# Patient Record
Sex: Male | Born: 1945 | Race: White | Hispanic: No | Marital: Married | Smoking: Former smoker
Health system: Southern US, Community
[De-identification: ages and names within clinical notes are randomized; demographics above are authoritative.]

## PROBLEM LIST (undated history)

## (undated) DIAGNOSIS — Z9989 Dependence on other enabling machines and devices: Secondary | ICD-10-CM

## (undated) DIAGNOSIS — J069 Acute upper respiratory infection, unspecified: Secondary | ICD-10-CM

## (undated) DIAGNOSIS — M1990 Unspecified osteoarthritis, unspecified site: Secondary | ICD-10-CM

## (undated) DIAGNOSIS — E7800 Pure hypercholesterolemia, unspecified: Secondary | ICD-10-CM

## (undated) DIAGNOSIS — I428 Other cardiomyopathies: Secondary | ICD-10-CM

## (undated) DIAGNOSIS — R0602 Shortness of breath: Secondary | ICD-10-CM

## (undated) DIAGNOSIS — I639 Cerebral infarction, unspecified: Secondary | ICD-10-CM

## (undated) DIAGNOSIS — I10 Essential (primary) hypertension: Secondary | ICD-10-CM

## (undated) DIAGNOSIS — G4733 Obstructive sleep apnea (adult) (pediatric): Secondary | ICD-10-CM

## (undated) DIAGNOSIS — E6609 Other obesity due to excess calories: Secondary | ICD-10-CM

## (undated) HISTORY — PX: EYE SURGERY: SHX253

## (undated) HISTORY — DX: Other obesity due to excess calories: E66.09

## (undated) HISTORY — PX: MANDIBLE FRACTURE SURGERY: SHX706

## (undated) HISTORY — DX: Other cardiomyopathies: I42.8

## (undated) HISTORY — DX: Unspecified osteoarthritis, unspecified site: M19.90

## (undated) HISTORY — DX: Obstructive sleep apnea (adult) (pediatric): G47.33

## (undated) HISTORY — DX: Dependence on other enabling machines and devices: Z99.89

---

## 2001-03-29 ENCOUNTER — Ambulatory Visit (HOSPITAL_COMMUNITY): Admission: RE | Admit: 2001-03-29 | Discharge: 2001-03-29 | Payer: Self-pay | Admitting: Internal Medicine

## 2001-03-29 ENCOUNTER — Encounter: Payer: Self-pay | Admitting: Internal Medicine

## 2001-03-30 ENCOUNTER — Ambulatory Visit (HOSPITAL_COMMUNITY): Admission: RE | Admit: 2001-03-30 | Discharge: 2001-03-30 | Payer: Self-pay | Admitting: Internal Medicine

## 2001-03-30 ENCOUNTER — Encounter: Payer: Self-pay | Admitting: Internal Medicine

## 2003-07-18 ENCOUNTER — Encounter: Payer: Self-pay | Admitting: Family Medicine

## 2003-07-18 ENCOUNTER — Encounter: Payer: Self-pay | Admitting: Internal Medicine

## 2003-07-18 ENCOUNTER — Ambulatory Visit (HOSPITAL_COMMUNITY): Admission: RE | Admit: 2003-07-18 | Discharge: 2003-07-18 | Payer: Self-pay | Admitting: Family Medicine

## 2003-07-18 ENCOUNTER — Ambulatory Visit (HOSPITAL_COMMUNITY): Admission: RE | Admit: 2003-07-18 | Discharge: 2003-07-18 | Payer: Self-pay | Admitting: Internal Medicine

## 2003-07-20 ENCOUNTER — Ambulatory Visit (HOSPITAL_COMMUNITY): Admission: RE | Admit: 2003-07-20 | Discharge: 2003-07-20 | Payer: Self-pay | Admitting: Family Medicine

## 2003-07-20 ENCOUNTER — Encounter: Payer: Self-pay | Admitting: Family Medicine

## 2003-08-16 ENCOUNTER — Encounter: Payer: Self-pay | Admitting: Internal Medicine

## 2003-08-16 ENCOUNTER — Ambulatory Visit (HOSPITAL_COMMUNITY): Admission: RE | Admit: 2003-08-16 | Discharge: 2003-08-16 | Payer: Self-pay | Admitting: Internal Medicine

## 2003-10-25 ENCOUNTER — Ambulatory Visit (HOSPITAL_COMMUNITY): Admission: RE | Admit: 2003-10-25 | Discharge: 2003-10-25 | Payer: Self-pay | Admitting: Neurology

## 2003-10-25 ENCOUNTER — Encounter: Payer: Self-pay | Admitting: Cardiology

## 2004-01-08 ENCOUNTER — Ambulatory Visit (HOSPITAL_COMMUNITY): Admission: RE | Admit: 2004-01-08 | Discharge: 2004-01-08 | Payer: Self-pay | Admitting: Internal Medicine

## 2005-06-23 ENCOUNTER — Emergency Department (HOSPITAL_COMMUNITY): Admission: EM | Admit: 2005-06-23 | Discharge: 2005-06-23 | Payer: Self-pay | Admitting: Emergency Medicine

## 2005-06-24 ENCOUNTER — Ambulatory Visit: Payer: Self-pay | Admitting: Orthopedic Surgery

## 2005-07-20 ENCOUNTER — Ambulatory Visit: Payer: Self-pay | Admitting: Orthopedic Surgery

## 2008-05-01 ENCOUNTER — Ambulatory Visit: Payer: Self-pay | Admitting: Internal Medicine

## 2008-05-10 ENCOUNTER — Encounter: Payer: Self-pay | Admitting: Internal Medicine

## 2008-05-10 ENCOUNTER — Ambulatory Visit: Payer: Self-pay | Admitting: Internal Medicine

## 2008-05-10 ENCOUNTER — Ambulatory Visit (HOSPITAL_COMMUNITY): Admission: RE | Admit: 2008-05-10 | Discharge: 2008-05-10 | Payer: Self-pay | Admitting: Internal Medicine

## 2011-04-28 NOTE — Consult Note (Signed)
NAME:  CORDERRO, KOLOSKI             ACCOUNT NO.:  [id number]   MEDICAL RECORD NO.:  [id number]          PATIENT TYPE:  AMB   LOCATION:  DAY                           FACILITY:  [facility]   PHYSICIAN:  R. Roetta Sessions, M.D. DATE OF BIRTH:  [birth year]   DATE OF CONSULTATION:  05/01/2008  DATE OF DISCHARGE:                                 CONSULTATION   REASON FOR CONSULTATION:  Hematochezia.   HISTORY OF PRESENT ILLNESS:  [name] is a 65 year old Caucasian  male.  He has a self-reported history of hemorrhoids, has always had  frequent intermittent bright red blood per rectum.  However, more  recently he has noticed a change in his stool.  He has noticed within  his stool a darker burgundy brown blood.  This has been present for  about the last 2 months, pretty much every time he has a bowel movement.  He noticed the pain is worse with straining, does occasionally has some  bright red blood as well.  He does take aspirin, but denies any other  NSAID use.  Denies any abdominal pain, proctalgia, or rectal pruritus.  Denies any anorexia.  He has noted some fatigue and shortness of breath  on exertion.  He was noted to have a hemoglobin in 14 range at Dr.  Sharyon Medicus office.  He denies any heartburn, ingestion, dysphagia,  odynophagia, nausea, or vomiting.  He does use Metamucil and FiberChoice  daily for constipation, which does seem to help.  He usually has a soft  bowel movement every day.   PAST MEDICAL AND SURGICAL HISTORY:  1. Hypertension.  2. Sleep apnea.  3. CVA.  4. Hemorrhoid surgery when he was in his 30s.  5. Last colonoscopy was January 08, 2004, by Dr. Jena Gauss. He was found      to have a small polyp in the hepatic flexure, which was      inflammatory and he had an EGD, which showed antral and bulbar      erosions and was otherwise normal.   CURRENT MEDICATIONS:  1. Multivitamin daily.  2. Aspirin 81 mg daily.  3. CPAP at night.  4. Fish oil once daily.  5. Metamucil at  bedtime.  6. FiberChoice in the morning.  7. Unknown name of blood pressure medicine 40 mg daily.   ALLERGIES:  No known drug allergies.   FAMILY HISTORY:  Father diagnosed with colon cancer at age 8.  Brother  diagnosed in his 30s.  Mother deceased at 52 due to coronary artery  disease.  One sister deceased secondary to CVA.  One brother deceased  with renal failure.   SOCIAL HISTORY:  Mr. Spilde is married.  He has 2 healthy children.  He is a retired Teacher, adult education.  He has a 30-pack-year history of tobacco use,  quit smoking about 15 years ago.  He denies any drug use.  Consumes 1-2  beers daily.   REVIEW OF SYSTEMS:  See HPI, otherwise negative.  SKIN:  He does notice  a rash around his umbilicus where he previously removed a tic.   PHYSICAL EXAMINATION:  VITAL SIGNS:  Weight 308 pounds, height 71  inches, temperature 98.2, blood pressure 132/80, and pulse 64.  GENERAL:  Mr. Traynham is an obese Caucasian male who is alert,  oriented, pleasant, and cooperative, in no acute distress.  HEENT:  Clear.  Sclerae nonicteric.  Conjunctivae are pink.  Oropharynx  pink and moist without any lesions.  NECK:  Supple without mass or thyromegaly.  CHEST:  Heart:  Regular rate and rhythm.  Normal S1 and S2.  No murmurs,  clicks, rubs, or gallops.  Lungs:  Clear to auscultation bilaterally.  ABDOMEN:  Positive bowel sounds x4.  No bruits auscultated.  He does  have an erythematous lesion at his umbilicus, which is about 1 cm in  circumference with erythema and a scaled center.  There are no exudates.  Abdomen is soft, nontender, and nondistended without palpable mass or  hepatosplenomegaly.  No rebound, tenderness, or guarding.  EXTREMITY:  Exam is limited given the patient's body habitus.  Extremities without clubbing or edema bilaterally.  SKIN:  See abdominal exam.   IMPRESSION:  Mr. Siegert is a 65 year old male with daily hematochezia  with anywhere from bright red blood to burgundy to  dark blood on a daily  basis.  Hemoglobin has remained stable at 14.  Given his strong family  history of colon cancer, we should proceed with colonoscopy to rule out  colorectal carcinoma.  Bleeding could be from benign anorectal source  such as fissure or internal hemorrhoids as well.   PLAN:  1. We will request recent labs from Dr. Sharyon Medicus office.  2. Colonoscopy with Dr. Jena Gauss in the near future.  Discussed the      procedures including the risks and benefits including but not      limited to bleeding, infection, perforation, drug reaction.  He      agrees with plan.  Consent will be obtained.  3. I have asked him to contact Dr. Sharyon Medicus office regarding his recent      tic bite and rash, which needs to be evaluated and he agrees that      he will do this today.   Thank you Dr. Sherwood Gambler for asking [place] to participate in the care of Mr.  Glasscock.      Lorenza Burton, N.P.      Jonathon Bellows, M.D.  Electronically Signed   KJ/MEDQ  D:  05/01/2008  T:  05/02/2008  Job:  [id number]

## 2011-04-28 NOTE — Op Note (Signed)
NAME:  Timothy Mcdaniel, Timothy Mcdaniel             ACCOUNT NO.:  [id number]   MEDICAL RECORD NO.:  [id number]          PATIENT TYPE:  AMB   LOCATION:  DAY                           FACILITY:  [facility]   PHYSICIAN:  R. Roetta Sessions, M.D. DATE OF BIRTH:  1946-04-20   DATE OF PROCEDURE:  05/10/2008  DATE OF DISCHARGE:                               OPERATIVE REPORT   INDICATIONS FOR PROCEDURE:  A 65 year old gentleman with intermittent  blood per rectum on occasion.  Last colonoscopy was a little over 4  years ago __________  inflammatory process with hepatic flexure.  He has  a positive family history of colon cancer.  Colonoscopy is now being  done.  This approach has been discussed with the patient at length.  Potential risks, benefits and alternatives have been reviewed.  Questions answered.  He is agreeable.  Please see documentation in  medical record.   PROCEDURE NOTE:  O2 saturation, blood pressure, pulse, and temperature  were monitored throughout the entire procedure.   CONSCIOUS SEDATION:  Versed 1 mg IV and Demerol 100 mg IV in divided  doses.   INSTRUMENT:  Pentax video chip system.   FINDINGS:  Digital rectal exam revealed no abnormalities.  Endoscopic  findings:  The prep was adequate.  Colon:  Colonic mucosa was surveyed  from the rectosigmoid junction through the left transverse right colon  to the appendiceal orifice, ileocecal valve and the cecum.  The  structures were well seen and photographed for the record.  From this  level, the scope was slowly and cautiously withdrawn.  All previous  mucosal surfaces were again seen.  The patient had a redundant cavernous  colon; however, the colonic mucosa otherwise appeared entirely normal.  The scope was pulled down into the rectum.  Thorough examination of the  rectal mucosa including  anoscopy of the anal canal demonstrated anal  canal hemorrhoids and anal papilla and there was one area of somewhat  unusual-appearing fibrotic mucosa of  the very distal rectum, just at the  anal verge.  It appears to be benign, but I did elect to go ahead and  biopsy this area.  The patient tolerated the procedure well and was  reactive to endoscopy.   IMPRESSION:  1. Friable anal canal hemorrhoids.  2. Anal papilla.  3. Questionable abnormal fold of the distal rectum status post biopsy,      otherwise normal rectum.  4. Redundant cavernous, but otherwise normal-appearing colonic mucosa.      I suspect the patient  has bled from this anorectum.   RECOMMENDATIONS:  1. Hemorrhoid literature provided to Mr. Fuston.  2. A 10-day course of Canasa.  3. Mesalamine 1 g suppositories one per rectum at bedtime.  4. Further recommendations to follow pending results of path.      Jonathon Bellows, M.D.  Electronically Signed     RMR/MEDQ  D:  05/10/2008  T:  05/11/2008  Job:  [id number]   cc:   Madelin Rear. Sherwood Gambler, MD  Fax: [phone]

## 2011-05-01 NOTE — Op Note (Signed)
NAME:  Timothy Mcdaniel, Timothy Mcdaniel                       ACCOUNT NO.:  [id number]   MEDICAL RECORD NO.:  [id number]                   PATIENT TYPE:  AMB   LOCATION:  DAY                                  FACILITY:  [facility]   PHYSICIAN:  R. Roetta Sessions, M.D.              DATE OF BIRTH:  02-17-1946   DATE OF PROCEDURE:  01/08/2004  DATE OF DISCHARGE:                                 OPERATIVE REPORT   PROCEDURE:  Colonoscopy with biopsy followed by diagnostic EGD.   INDICATIONS FOR PROCEDURE:  The patient is a 65 year old gentleman with a  recent history of large volume ring colored stool with some gross blood per  rectum resolved with discontinuation of Plavix.  He has a positive family  history of colorectal cancer.  Colonoscopy back in 1999 demonstrated only  some internal hemorrhoids.   Colonoscopy is now being done to further evaluate his GI bleeding. If the  colonoscopy is unrevealing will perform and EGD.  This approach has been  discussed with the patient at length. The potential risks, benefits, and  alternatives have been reviewed, questions answered.  Please see my dictated  H&P of December 24, 2003 for more information.   MONITORING:  O2 saturation, blood pressure, pulse and respirations were  monitored throughout the entire procedure.   CONSCIOUS SEDATION:  Versed 5 mg IV, Demerol 100 mg IV in divided doses for  both procedures.   INSTRUMENT:  Olympus videochip adult colonoscope and gastroscope   COLONOSCOPY FINDINGS:  Digital rectal exam revealed no abnormalities.   ENDOSCOPIC FINDINGS:  The prep was adequate.   RECTUM:  Examination of the rectal mucosa including retroflexed view of the  anal verge revealed no abnormalities.   COLON:  The colonic mucosa was surveyed from the rectosigmoid junction  through the left transverse right colon to the area of the appendiceal  orifice, ileocecal valve and cecum. These structures were seen and  photographed for the record. From this  level, the scope was slowly  withdrawn. All previously mentioned mucosal surfaces were again seen. The  only abnormality noted was a 3 mm polyp behind the fold at the hepatic  flexure which was cold biopsied and removed.  The patient tolerated the  colonoscopy and prepared for EGD.   FINDINGS:  Examination of the tubular esophagus revealed no mucosal  abnormalities. The EG junction was easily traversed.   STOMACH:  The gastric cavity insufflated well with air. A thorough  examination of the gastric mucosa including a retroflexed view of the  proximal stomach and esophagogastric junction demonstrated only some antral  erosions. There was no infiltrating process or frank ulcer.  The pylorus was  patent and easily traversed.   DUODENUM:  The bulb and second portion revealed bulbar erosions but again no  ulcer and remainder of D1 and D2 appeared normal.   THERAPEUTIC/DIAGNOSTIC MANEUVERS:  None.   The patient tolerated the procedure well and was reacted  in endoscopy.   IMPRESSION:  1. Colonoscopy normal rectum.  2. Small polyp of the hepatic flexure cold biopsied/removed.  The remainder     of the colonic mucosa appeared normal.  3. EGD findings, normal esophagus, antral erosions, otherwise normal     stomach.  Bulbar erosions otherwise normal D1 and D2.   RECOMMENDATIONS:  1. Expect Plavix had something to do with this bleeding recently.  Will     check H. pylori serologies.  2. Followup on path.  3. Further recommends to follow.      ___________________________________________                                            Jonathon Bellows, M.D.   RMR/MEDQ  D:  01/08/2004  T:  01/08/2004  Job:  [id number]   cc:   Madelin Rear. Sherwood Gambler, M.D.  [address]  [place]  [place] [address]  Fax: [phone]

## 2011-05-01 NOTE — Procedures (Signed)
NAME:  Timothy Mcdaniel, Timothy Mcdaniel                       ACCOUNT NO.:  [id number]   MEDICAL RECORD NO.:  [id number]                   PATIENT TYPE:  OUT   LOCATION:  RAD                                  FACILITY:  [facility]   PHYSICIAN:  Madelin Rear. Sherwood Gambler, M.D.             DATE OF BIRTH:  12-20-1945   DATE OF PROCEDURE:  08/16/2003  DATE OF DISCHARGE:                                  ECHOCARDIOGRAM   INDICATIONS FOR PROCEDURE:  Timothy Mcdaniel is a 65 year old gentleman with a  past medical history of a stroke who has been having headaches.   TECHNICAL QUALITY:  The technical quality of the study is limited secondary  to patient body habitus and poor acoustic windows.   The aorta appears to be within normal limits and size at 3.4 cm. There does  appear to be a suggestion of a double density in the aortic root/proximal  ascending aorta. This is not well visualized.   The left atrium is normal in size, measured at 3.6 cm. No obvious clots or  masses were appreciated in the left atrium. The patient appeared to be in  sinus rhythm with occasional PAC's and PVC's during this procedure.   The interventricular septum and posterior wall were both mildly thickened at  1.3 cm for each.   The aortic valve was not well visualized but appeared to be mildly thickened  with normal leaflet excursion. No aortic insufficiency is appreciated.  Doppler interrogation of the aortic valve was within normal limits.   The mitral valve also appeared grossly structurally normal with mild  thickening on the distal anterior leaflet. Mild mitral annular calcification  was noted. Lax chordae was appreciated with enhanced chordal motion. No  mitral valve prolapse was appreciated. Trace to mild mitral regurgitation  was present. Doppler interrogation of the mitral valve was otherwise within  normal limits.   The pulmonic valve was not well visualized. Trivial pulmonic insufficiency  was appreciated.   The tricuspid valve  was also not well visualized with probable trace to mild  tricuspid regurgitation, although this was not clearly seen.   The left ventricle was at the upper limits of normal in size. The LVIDD  measured at 5.7 cm and the LVISD measured at 3.9 cm. Subjectively, the left  ventricle appeared to be mildly dilated. Overall left ventricular systolic  function appeared to be mildly depressed with an estimated ejection fraction  of 40% to 50%. There was mild global hypokinesis, although the anterior  septal wall did appear to be somewhat more hypokinetic than the remaining  walls. The presence of diastolic dysfunction is inferred from pulse wave  Doppler across the mitral valve. The right atrium and right ventricle  appeared normal in size.  I cannot exclude the possibility of an apical  thrombus, which is suggested in the apical views only.   IMPRESSION:  1. The aorta appears normal in size but there is a  suggestion of a double     density in the aortic root/proximal ascending aorta. This was not     clearly visualized. No significant aortic insufficiency exists.  2. Mild concentric left ventricular hypertrophy.  3. Mild aortic sclerosis without true stenosis.  4. Mildly thickened mitral valve but with normal leaflet excursion and trace     to mild mitral regurgitation. The presence of a lax chordae is noted.  5. Probable trace to mild tricuspid regurgitation.  6. Trace pulmonic insufficiency.  7. The left ventricle appears mildly dilated with mild decrease in overall     ejection fraction estimated at 40% to 50%. Mild global hypokinesis is     present with accentuated anterior septal hypokinesis, as compared to the     remaining walls.  8. I cannot exclude the possibility of an apical thrombus, which is     suggested in the apical views only.  9. The presence of diastolic dysfunction is inferred from pulse wave Doppler     across the mitral valve.  10.      Consider CT, MR, or  transesophageal echocardiography for further     delineation of the cardiac anatomy, if clinically indicated.     Dani Gobble, MD                          Madelin Rear. Sherwood Gambler, M.D.    AB/MEDQ  D:  08/16/2003  T:  08/16/2003  Job:  [id number]   cc:   Madelin Rear. Sherwood Gambler, M.D.  [address]  [place]  [place] [address]  Fax: [phone]

## 2011-05-01 NOTE — Consult Note (Signed)
NAME:  EDEL, RIVERO                       ACCOUNT NO.:  [id number]   MEDICAL RECORD NO.:  [id number]                  PATIENT TYPE:   LOCATION:                                       FACILITY:   PHYSICIAN:  R. Roetta Sessions, M.D.              DATE OF BIRTH:  10-08-1946   DATE OF CONSULTATION:  DATE OF DISCHARGE:                                   CONSULTATION   REQUESTING PHYSICIAN:  Dr. Sherwood Gambler.   CHIEF COMPLAINT:  Rectal bleeding.   HISTORY OF PRESENT ILLNESS:  Mr. Ella Guillotte is a 65 year old Caucasian  male who presents to our office for approximately three week history of  large volume maroon colored rectal bleeding with every stool. He recently  had CVA in August 2004 and was started on aspirin 325 mg daily which was  discontinued in December 2004 as well as Plavix which was started in  November 2004 and stopped approximately two weeks after being initiated due  to the rectal bleeding. Mr. Merrow does have a history of this in the past  and underwent colonoscopy by Dr. Jena Gauss on March 22, 1998. He was found to  have friable internal hemorrhoids and an otherwise normal rectum and colon  and was mildly anemia at that time. He was treated with a course of Anusol  suppositories for three weeks with good relief. He has had intermittent  hematochezia from his hemorrhoids in the past; however, nothing as severe as  episodes described above. He reports he has bowel movements normally every  day or twice a day and has not noticed any more rectal bleeding in the last  couple weeks. He denies any problems with constipation or diarrhea. He  denies any associated abdominal pain. He does report occasional heartburn  and indigestion which he uses Alka-Seltzer with good relief. He denies any  history of peptic ulcer disease. HE denies any dysphagia or odynophagia.   PAST MEDICAL HISTORY:  1. Last colonoscopy April 1999 by Dr. Jena Gauss, friable internal hemorrhoids,     otherwise normal  rectum and colon.  2. Hypertension.  3. CVA August of 2004.  4. Sleep apnea on CPAP.   PAST SURGICAL HISTORY:  1. Hemorrhoidectomy when Mr. Chachere is in his 30s.  2. Fractured jaw repair when he was in his 39s.   CURRENT MEDICATIONS:  1. __________ 20 mg daily.  2. Multivitamin daily.  3. CPAP at night.   ALLERGIES:  No known drug allergies.   FAMILY HISTORY:  Positive family history for father with what Mr. Parisien  believes to be colon carcinoma; however, do not have verification of this  report. Also there is question as to whether an older brother may have had a  colonic resection for carcinoma years ago although he is unsure of this as  well. Otherwise, past medical history is significant for CAD, MI, CVA, and  diabetes mellitus.   SOCIAL HISTORY:  Mr. Hinch has  been married for 36 years. He has two  grown healthy sons. 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 He currently does not smoke;  however, he does report a 30+ year history of tobacco use with cigarettes,  cigars, and pipes. He reports occasional alcohol use once or twice per day.  Denies any drug use.   REVIEW OF SYSTEMS:  CONSTITUTIONAL:  Appetite is good. HE is complaining of  some fatigue although this has improved with his use of CPAP for his sleep  apnea. Weight is increasing steadily. CARDIOVASCULAR:  Denies any chest pain  or palpitations. PULMONARY:  Occasional shortness of breath. No cough. No  hemoptysis. HEME:  Does have history of anemia, reports recent CBC at Dr.  Sharyon Medicus office which we will try to obtain results. GASTROINTESTINAL:  See  HPI. SKIN:  Denies any rashes or jaundice.   PHYSICAL EXAMINATION:  VITAL SIGNS:  Weight 297 pounds, height 71 inches.  Temperature 97.4, blood pressure 124/98, pulse 72.  GENERAL:  Mr. Absalom Aro is a 65 year old Caucasian male who is obese.  He is alert, oriented, pleasant, and cooperative in no  acute distress.  HEENT:  Sclerae clear and nonicteric. Conjunctivae pink. Oropharynx pink and  moist without any lesions.  NECK:  Supple without any masses or thyromegaly.  CHEST:  Heart regular rate and rhythm without murmurs, clicks, rubs, or  gallops.  LUNGS:  Clear to auscultation bilaterally.  ABDOMEN:  Protuberant with positive bowel sounds x4, soft, nontender,  nondistended but no palpable mass or hepatosplenomegaly although exam is  limited due to patient's habitus.  EXTREMITIES:  2+ pedal pulses bilaterally. Slight trace pedal edema.  SKIN:  Pink, warm, and dry without any rash or jaundice.   ASSESSMENT:  Mr. Lee Kuang is a 65 year old Caucasian male with three  week history of large volume, maroon colored, rectal bleeding which has  significantly resolved with discontinuation of Plavix; Mr. Farino is over  due for colonoscopy given family history of colorectal carcinoma, and this  should be #1 priority to rule out colorectal carcinoma. Also further  evaluation with EGD is warranted if colonoscopy is unremarkable. I have  discussed these procedures including risks and benefits which include but  are not limited to bleeding, perforation, and infection with Mr. Laufer,  and he agrees with this plan. This may be something as simple as his friable  internal hemorrhoids; however, may need to rule out colorectal carcinoma,  diverticular disease, or upper GI disease initially.   RECOMMENDATIONS:  1. Colonoscopy plus/minus EGD to be scheduled as soon as possible by Dr.     Jena Gauss. Consent will be obtained.  2. Will request recent CBC from Dr. Sharyon Medicus office to evaluate anemia.  3. Further recommendations pending this procedure.   We would like to thank Dr. Sherwood Gambler for this kind referral.     ________________________________________  ___________________________________________  Nicholas Lose, N.P.                  Jonathon Bellows, M.D.  KC/MEDQ  D:  12/24/2003  T:   12/24/2003  Job:  [id number]   cc:   Madelin Rear. Sherwood Gambler, M.D.  [address]  [place]  [place] [address]  Fax: [phone]

## 2011-08-03 HISTORY — PX: TRANSTHORACIC ECHOCARDIOGRAM: SHX275

## 2011-12-24 DIAGNOSIS — H4011X0 Primary open-angle glaucoma, stage unspecified: Secondary | ICD-10-CM | POA: Diagnosis not present

## 2011-12-25 DIAGNOSIS — Z23 Encounter for immunization: Secondary | ICD-10-CM | POA: Diagnosis not present

## 2011-12-25 DIAGNOSIS — I10 Essential (primary) hypertension: Secondary | ICD-10-CM | POA: Diagnosis not present

## 2011-12-25 DIAGNOSIS — Z0000 Encounter for general adult medical examination without abnormal findings: Secondary | ICD-10-CM | POA: Diagnosis not present

## 2011-12-25 DIAGNOSIS — R7989 Other specified abnormal findings of blood chemistry: Secondary | ICD-10-CM | POA: Diagnosis not present

## 2011-12-25 DIAGNOSIS — Z125 Encounter for screening for malignant neoplasm of prostate: Secondary | ICD-10-CM | POA: Diagnosis not present

## 2011-12-25 DIAGNOSIS — N390 Urinary tract infection, site not specified: Secondary | ICD-10-CM | POA: Diagnosis not present

## 2011-12-25 DIAGNOSIS — M159 Polyosteoarthritis, unspecified: Secondary | ICD-10-CM | POA: Diagnosis not present

## 2012-01-21 DIAGNOSIS — G4733 Obstructive sleep apnea (adult) (pediatric): Secondary | ICD-10-CM | POA: Diagnosis not present

## 2012-01-21 DIAGNOSIS — I6789 Other cerebrovascular disease: Secondary | ICD-10-CM | POA: Diagnosis not present

## 2012-01-21 DIAGNOSIS — E669 Obesity, unspecified: Secondary | ICD-10-CM | POA: Diagnosis not present

## 2012-01-21 DIAGNOSIS — I10 Essential (primary) hypertension: Secondary | ICD-10-CM | POA: Diagnosis not present

## 2012-02-01 ENCOUNTER — Encounter (HOSPITAL_COMMUNITY): Payer: Self-pay | Admitting: Pharmacy Technician

## 2012-02-04 ENCOUNTER — Encounter (HOSPITAL_COMMUNITY)
Admission: RE | Admit: 2012-02-04 | Discharge: 2012-02-04 | Disposition: A | Discharge status: Home / Self Care | Payer: Medicare Other | Source: Ambulatory Visit | Attending: Orthopedic Surgery | Admitting: Orthopedic Surgery

## 2012-02-04 ENCOUNTER — Other Ambulatory Visit: Payer: Self-pay | Admitting: Orthopedic Surgery

## 2012-02-04 ENCOUNTER — Encounter (HOSPITAL_COMMUNITY): Payer: Self-pay

## 2012-02-04 DIAGNOSIS — Z01811 Encounter for preprocedural respiratory examination: Secondary | ICD-10-CM | POA: Diagnosis not present

## 2012-02-04 DIAGNOSIS — M25569 Pain in unspecified knee: Secondary | ICD-10-CM | POA: Diagnosis not present

## 2012-02-04 HISTORY — DX: Acute upper respiratory infection, unspecified: J06.9

## 2012-02-04 HISTORY — DX: Shortness of breath: R06.02

## 2012-02-04 HISTORY — DX: Unspecified osteoarthritis, unspecified site: M19.90

## 2012-02-04 HISTORY — DX: Cerebral infarction, unspecified: I63.9

## 2012-02-04 HISTORY — DX: Essential (primary) hypertension: I10

## 2012-02-04 LAB — URINALYSIS, ROUTINE W REFLEX MICROSCOPIC
Ketones, ur: NEGATIVE mg/dL
Leukocytes, UA: NEGATIVE
Nitrite: NEGATIVE
Protein, ur: NEGATIVE mg/dL
pH: 8 (ref 5.0–8.0)

## 2012-02-04 LAB — SURGICAL PCR SCREEN
MRSA, PCR: NEGATIVE
Staphylococcus aureus: NEGATIVE

## 2012-02-04 LAB — CBC
HCT: 39.5 % (ref 39.0–52.0)
MCH: 30 pg (ref 26.0–34.0)
MCHC: 33.7 g/dL (ref 30.0–36.0)
MCV: 89 fL (ref 78.0–100.0)
RDW: 14.1 % (ref 11.5–15.5)

## 2012-02-04 LAB — DIFFERENTIAL
Basophils Relative: 1 % (ref 0–1)
Monocytes Relative: 8 % (ref 3–12)
Neutro Abs: 3.1 10*3/uL (ref 1.7–7.7)
Neutrophils Relative %: 50 % (ref 43–77)

## 2012-02-04 LAB — COMPREHENSIVE METABOLIC PANEL
Albumin: 4.3 g/dL (ref 3.5–5.2)
BUN: 17 mg/dL (ref 6–23)
Calcium: 10.2 mg/dL (ref 8.4–10.5)
Chloride: 105 mEq/L (ref 96–112)
Creatinine, Ser: 0.85 mg/dL (ref 0.50–1.35)
Total Bilirubin: 0.3 mg/dL (ref 0.3–1.2)
Total Protein: 7.6 g/dL (ref 6.0–8.3)

## 2012-02-04 LAB — PROTIME-INR: INR: 1.03 (ref 0.00–1.49)

## 2012-02-04 NOTE — Progress Notes (Signed)
Call to [facility], requested records ekg, last ov note, echo, spoke with Cordelia Pen

## 2012-02-04 NOTE — Pre-Procedure Instructions (Signed)
20 QUENTIN SHOREY  02/04/2012   Your procedure is scheduled on:  02/15/2012  Report to [facility] at 5:30 AM.  Call this number if you have problems the morning of surgery: [phone]   Remember:   Do not eat food:After Midnight.  May have clear liquids: up to 4 Hours before arrival.  Clear liquids include soda, tea, black coffee, apple or grape juice, broth.  Take these medicines the morning of surgery with A SIP OF WATER:   PAIN MEDICINE is OK morning of surgery- (TRAMADOL)   Do not wear jewelry, make-up or nail polish.  Do not wear lotions, powders, or perfumes. You may wear deodorant.  Do not shave 48 hours prior to surgery.  Do not bring valuables to the hospital.  Contacts, dentures or bridgework may not be worn into surgery.  Leave suitcase in the car. After surgery it may be brought to your room.  For patients admitted to the hospital, checkout time is 11:00 AM the day of discharge.   Patients discharged the day of surgery will not be allowed to drive home.  Name and phone number of your driver:   With wife  Special Instructions: CHG Shower Use Special Wash: 1/2 bottle night before surgery and 1/2 bottle morning of surgery.   Please read over the following fact sheets that you were given: Pain Booklet, Coughing and Deep Breathing, Blood Transfusion Information, Total Joint Packet, MRSA Information and Surgical Site Infection Prevention

## 2012-02-14 MED ORDER — CEFAZOLIN SODIUM-DEXTROSE 2-3 GM-% IV SOLR
2.0000 g | INTRAVENOUS | Status: AC
  Administered 2012-02-15: 2 g via INTRAVENOUS
  Filled 2012-02-14: qty 50

## 2012-02-15 ENCOUNTER — Encounter (HOSPITAL_COMMUNITY): Payer: Self-pay | Admitting: Certified Registered"

## 2012-02-15 ENCOUNTER — Encounter (HOSPITAL_COMMUNITY)
Admission: RE | Disposition: A | Discharge status: Home Health | Payer: Self-pay | Source: Ambulatory Visit | Attending: Orthopedic Surgery

## 2012-02-15 ENCOUNTER — Encounter (HOSPITAL_COMMUNITY): Payer: Self-pay | Admitting: *Deleted

## 2012-02-15 ENCOUNTER — Ambulatory Visit (HOSPITAL_COMMUNITY): Payer: Medicare Other | Admitting: Certified Registered"

## 2012-02-15 ENCOUNTER — Inpatient Hospital Stay (HOSPITAL_COMMUNITY)
Admission: RE | Admit: 2012-02-15 | Discharge: 2012-02-17 | DRG: 470 | Disposition: A | Discharge status: Home Health | Payer: Medicare Other | Source: Ambulatory Visit | Attending: Orthopedic Surgery | Admitting: Orthopedic Surgery

## 2012-02-15 DIAGNOSIS — Z87891 Personal history of nicotine dependence: Secondary | ICD-10-CM | POA: Diagnosis not present

## 2012-02-15 DIAGNOSIS — G8918 Other acute postprocedural pain: Secondary | ICD-10-CM | POA: Diagnosis not present

## 2012-02-15 DIAGNOSIS — Z79899 Other long term (current) drug therapy: Secondary | ICD-10-CM | POA: Diagnosis not present

## 2012-02-15 DIAGNOSIS — M19019 Primary osteoarthritis, unspecified shoulder: Secondary | ICD-10-CM | POA: Diagnosis present

## 2012-02-15 DIAGNOSIS — D62 Acute posthemorrhagic anemia: Secondary | ICD-10-CM | POA: Diagnosis not present

## 2012-02-15 DIAGNOSIS — I10 Essential (primary) hypertension: Secondary | ICD-10-CM | POA: Diagnosis present

## 2012-02-15 DIAGNOSIS — M1710 Unilateral primary osteoarthritis, unspecified knee: Secondary | ICD-10-CM | POA: Diagnosis not present

## 2012-02-15 DIAGNOSIS — Z8673 Personal history of transient ischemic attack (TIA), and cerebral infarction without residual deficits: Secondary | ICD-10-CM | POA: Diagnosis not present

## 2012-02-15 DIAGNOSIS — M1712 Unilateral primary osteoarthritis, left knee: Secondary | ICD-10-CM

## 2012-02-15 HISTORY — PX: TOTAL KNEE ARTHROPLASTY: SHX125

## 2012-02-15 LAB — TYPE AND SCREEN

## 2012-02-15 SURGERY — ARTHROPLASTY, KNEE, TOTAL
Anesthesia: Regional | Site: Knee | Laterality: Left | Wound class: Clean

## 2012-02-15 MED ORDER — ROCURONIUM BROMIDE 100 MG/10ML IV SOLN
INTRAVENOUS | Status: DC | PRN
  Administered 2012-02-15: 50 mg via INTRAVENOUS

## 2012-02-15 MED ORDER — SODIUM CHLORIDE 0.9 % IR SOLN
Status: DC | PRN
  Administered 2012-02-15: 4000 mL

## 2012-02-15 MED ORDER — BUPIVACAINE ON-Q PAIN PUMP (FOR ORDER SET NO CHG): INJECTION | Status: DC

## 2012-02-15 MED ORDER — METOCLOPRAMIDE HCL 10 MG PO TABS: 5.0000 mg | ORAL_TABLET | Freq: Three times a day (TID) | ORAL | Status: DC | PRN

## 2012-02-15 MED ORDER — METHOCARBAMOL 100 MG/ML IJ SOLN
500.0000 mg | Freq: Four times a day (QID) | INTRAVENOUS | Status: DC | PRN
  Filled 2012-02-15: qty 5

## 2012-02-15 MED ORDER — HYDROMORPHONE HCL PF 1 MG/ML IJ SOLN
INTRAMUSCULAR | Status: AC
  Filled 2012-02-15: qty 1

## 2012-02-15 MED ORDER — LISINOPRIL 40 MG PO TABS
40.0000 mg | ORAL_TABLET | Freq: Every day | ORAL | Status: DC
  Administered 2012-02-16 – 2012-02-17 (×2): 40 mg via ORAL
  Filled 2012-02-15 (×2): qty 1

## 2012-02-15 MED ORDER — FLEET ENEMA 7-19 GM/118ML RE ENEM: 1.0000 | ENEMA | Freq: Once | RECTAL | Status: AC | PRN

## 2012-02-15 MED ORDER — SODIUM CHLORIDE 0.9 % IV SOLN: INTRAVENOUS | Status: DC

## 2012-02-15 MED ORDER — ONDANSETRON HCL 4 MG PO TABS: 4.0000 mg | ORAL_TABLET | Freq: Four times a day (QID) | ORAL | Status: DC | PRN

## 2012-02-15 MED ORDER — MEPERIDINE HCL 25 MG/ML IJ SOLN
INTRAMUSCULAR | Status: AC
  Administered 2012-02-15: 12.5 mg via INTRAVENOUS
  Filled 2012-02-15: qty 1

## 2012-02-15 MED ORDER — HYDROCHLOROTHIAZIDE 12.5 MG PO CAPS
12.5000 mg | ORAL_CAPSULE | Freq: Every day | ORAL | Status: DC
Start: 2012-02-15 — End: 2012-02-17
  Administered 2012-02-17: 12.5 mg via ORAL
  Filled 2012-02-15 (×2): qty 1

## 2012-02-15 MED ORDER — HYDROMORPHONE HCL PF 1 MG/ML IJ SOLN
0.2500 mg | INTRAMUSCULAR | Status: DC | PRN
  Administered 2012-02-15 (×4): 0.5 mg via INTRAVENOUS

## 2012-02-15 MED ORDER — DIPHENHYDRAMINE HCL 12.5 MG/5ML PO ELIX: 12.5000 mg | ORAL_SOLUTION | ORAL | Status: DC | PRN

## 2012-02-15 MED ORDER — HYDROMORPHONE HCL PF 1 MG/ML IJ SOLN
0.5000 mg | INTRAMUSCULAR | Status: DC | PRN
  Administered 2012-02-15 – 2012-02-16 (×3): 1 mg via INTRAVENOUS
  Filled 2012-02-15 (×3): qty 1

## 2012-02-15 MED ORDER — PROPOFOL 10 MG/ML IV EMUL
INTRAVENOUS | Status: DC | PRN
  Administered 2012-02-15: 200 mg via INTRAVENOUS

## 2012-02-15 MED ORDER — CHLORHEXIDINE GLUCONATE 4 % EX LIQD
60.0000 mL | Freq: Once | CUTANEOUS | Status: DC
  Filled 2012-02-15: qty 60

## 2012-02-15 MED ORDER — SODIUM CHLORIDE 0.9 % IV SOLN
INTRAVENOUS | Status: DC
  Administered 2012-02-15 (×2): via INTRAVENOUS

## 2012-02-15 MED ORDER — OXYCODONE HCL 20 MG PO TB12
20.0000 mg | ORAL_TABLET | Freq: Two times a day (BID) | ORAL | Status: DC
  Administered 2012-02-15 – 2012-02-17 (×4): 20 mg via ORAL
  Filled 2012-02-15 (×4): qty 1

## 2012-02-15 MED ORDER — FENTANYL CITRATE 0.05 MG/ML IJ SOLN: 50.0000 ug | INTRAMUSCULAR | Status: DC | PRN

## 2012-02-15 MED ORDER — NABUMETONE 500 MG PO TABS
500.0000 mg | ORAL_TABLET | Freq: Two times a day (BID) | ORAL | Status: DC
  Administered 2012-02-15 – 2012-02-17 (×4): 500 mg via ORAL
  Filled 2012-02-15 (×5): qty 1

## 2012-02-15 MED ORDER — BUPIVACAINE 0.25 % ON-Q PUMP SINGLE CATH 300ML
300.0000 mL | INJECTION | Status: AC
  Administered 2012-02-15: 300 mL
  Filled 2012-02-15: qty 300

## 2012-02-15 MED ORDER — BISACODYL 5 MG PO TBEC: 5.0000 mg | DELAYED_RELEASE_TABLET | Freq: Every day | ORAL | Status: DC | PRN

## 2012-02-15 MED ORDER — OXYCODONE HCL 5 MG PO TABS
5.0000 mg | ORAL_TABLET | ORAL | Status: DC | PRN
  Administered 2012-02-16 – 2012-02-17 (×4): 10 mg via ORAL
  Filled 2012-02-15 (×4): qty 2

## 2012-02-15 MED ORDER — FENTANYL CITRATE 0.05 MG/ML IJ SOLN
INTRAMUSCULAR | Status: DC | PRN
  Administered 2012-02-15 (×4): 50 ug via INTRAVENOUS
  Administered 2012-02-15: 100 ug via INTRAVENOUS
  Administered 2012-02-15 (×2): 50 ug via INTRAVENOUS

## 2012-02-15 MED ORDER — DOCUSATE SODIUM 100 MG PO CAPS
100.0000 mg | ORAL_CAPSULE | Freq: Two times a day (BID) | ORAL | Status: DC
  Administered 2012-02-15 – 2012-02-17 (×4): 100 mg via ORAL
  Filled 2012-02-15 (×5): qty 1

## 2012-02-15 MED ORDER — METHOCARBAMOL 500 MG PO TABS
500.0000 mg | ORAL_TABLET | Freq: Four times a day (QID) | ORAL | Status: DC | PRN
  Administered 2012-02-16 (×2): 500 mg via ORAL
  Filled 2012-02-15 (×2): qty 1

## 2012-02-15 MED ORDER — BUPIVACAINE-EPINEPHRINE 0.25% -1:200000 IJ SOLN
INTRAMUSCULAR | Status: DC | PRN
  Administered 2012-02-15: 10 mL

## 2012-02-15 MED ORDER — BUPIVACAINE-EPINEPHRINE PF 0.5-1:200000 % IJ SOLN
INTRAMUSCULAR | Status: DC | PRN
  Administered 2012-02-15: 30 mL

## 2012-02-15 MED ORDER — CEFAZOLIN SODIUM-DEXTROSE 2-3 GM-% IV SOLR
2.0000 g | Freq: Four times a day (QID) | INTRAVENOUS | Status: AC
  Administered 2012-02-15 – 2012-02-16 (×3): 2 g via INTRAVENOUS
  Filled 2012-02-15 (×3): qty 50

## 2012-02-15 MED ORDER — MIDAZOLAM HCL 2 MG/2ML IJ SOLN: 1.0000 mg | INTRAMUSCULAR | Status: DC | PRN

## 2012-02-15 MED ORDER — ENOXAPARIN SODIUM 40 MG/0.4ML ~~LOC~~ SOLN
40.0000 mg | SUBCUTANEOUS | Status: DC
  Administered 2012-02-16 – 2012-02-17 (×2): 40 mg via SUBCUTANEOUS
  Filled 2012-02-15 (×3): qty 0.4

## 2012-02-15 MED ORDER — SENNOSIDES-DOCUSATE SODIUM 8.6-50 MG PO TABS: 1.0000 | ORAL_TABLET | Freq: Every evening | ORAL | Status: DC | PRN

## 2012-02-15 MED ORDER — MIDAZOLAM HCL 5 MG/5ML IJ SOLN
INTRAMUSCULAR | Status: DC | PRN
  Administered 2012-02-15: 2 mg via INTRAVENOUS

## 2012-02-15 MED ORDER — ONDANSETRON HCL 4 MG/2ML IJ SOLN: 4.0000 mg | Freq: Four times a day (QID) | INTRAMUSCULAR | Status: DC | PRN

## 2012-02-15 MED ORDER — LACTATED RINGERS IV SOLN
INTRAVENOUS | Status: DC | PRN
  Administered 2012-02-15 (×2): via INTRAVENOUS

## 2012-02-15 MED ORDER — ZOLPIDEM TARTRATE 5 MG PO TABS: 5.0000 mg | ORAL_TABLET | Freq: Every evening | ORAL | Status: DC | PRN

## 2012-02-15 MED ORDER — ONDANSETRON HCL 4 MG/2ML IJ SOLN
INTRAMUSCULAR | Status: DC | PRN
  Administered 2012-02-15: 4 mg via INTRAVENOUS

## 2012-02-15 MED ORDER — METOCLOPRAMIDE HCL 5 MG/ML IJ SOLN: 5.0000 mg | Freq: Three times a day (TID) | INTRAMUSCULAR | Status: DC | PRN

## 2012-02-15 MED ORDER — TRAVOPROST (BAK FREE) 0.004 % OP SOLN
1.0000 [drp] | Freq: Every day | OPHTHALMIC | Status: DC
  Administered 2012-02-15 – 2012-02-16 (×2): 1 [drp] via OPHTHALMIC
  Filled 2012-02-15: qty 2.5

## 2012-02-15 MED ORDER — ACETAMINOPHEN 10 MG/ML IV SOLN: 1000.0000 mg | Freq: Four times a day (QID) | INTRAVENOUS | Status: DC

## 2012-02-15 SURGICAL SUPPLY — 58 items
BANDAGE ESMARK 6X9 LF (GAUZE/BANDAGES/DRESSINGS) ×1 IMPLANT
BLADE SAGITTAL 13X1.27X60 (BLADE) ×2 IMPLANT
BLADE SAW SGTL 83.5X18.5 (BLADE) ×2 IMPLANT
BNDG ESMARK 6X9 LF (GAUZE/BANDAGES/DRESSINGS) ×2
BOWL SMART MIX CTS (DISPOSABLE) ×2 IMPLANT
CATH KIT ON Q 5IN SLV (PAIN MANAGEMENT) ×2 IMPLANT
CEMENT BONE SIMPLEX SPEEDSET (Cement) ×4 IMPLANT
CLOTH BEACON ORANGE TIMEOUT ST (SAFETY) ×2 IMPLANT
COVER BACK TABLE 24X17X13 BIG (DRAPES) ×2 IMPLANT
COVER SURGICAL LIGHT HANDLE (MISCELLANEOUS) ×4 IMPLANT
CUFF TOURNIQUET SINGLE 34IN LL (TOURNIQUET CUFF) ×2 IMPLANT
DRAPE EXTREMITY T 121X128X90 (DRAPE) ×2 IMPLANT
DRAPE INCISE IOBAN 66X45 STRL (DRAPES) ×4 IMPLANT
DRAPE PROXIMA HALF (DRAPES) ×2 IMPLANT
DRAPE U-SHAPE 47X51 STRL (DRAPES) ×2 IMPLANT
DRSG ADAPTIC 3X8 NADH LF (GAUZE/BANDAGES/DRESSINGS) IMPLANT
DRSG PAD ABDOMINAL 8X10 ST (GAUZE/BANDAGES/DRESSINGS) IMPLANT
DURAPREP 26ML APPLICATOR (WOUND CARE) ×6 IMPLANT
ELECT REM PT RETURN 9FT ADLT (ELECTROSURGICAL) ×2
ELECTRODE REM PT RTRN 9FT ADLT (ELECTROSURGICAL) ×1 IMPLANT
EVACUATOR 1/8 PVC DRAIN (DRAIN) ×2 IMPLANT
GLOVE BIOGEL M 7.0 STRL (GLOVE) IMPLANT
GLOVE BIOGEL PI IND STRL 7.0 (GLOVE) ×1 IMPLANT
GLOVE BIOGEL PI IND STRL 7.5 (GLOVE) IMPLANT
GLOVE BIOGEL PI IND STRL 8.5 (GLOVE) ×2 IMPLANT
GLOVE BIOGEL PI INDICATOR 7.0 (GLOVE) ×1
GLOVE BIOGEL PI INDICATOR 7.5 (GLOVE)
GLOVE BIOGEL PI INDICATOR 8.5 (GLOVE) ×2
GLOVE SURG ORTHO 8.0 STRL STRW (GLOVE) ×4 IMPLANT
GLOVE SURG SS PI 6.5 STRL IVOR (GLOVE) ×2 IMPLANT
GOWN PREVENTION PLUS XLARGE (GOWN DISPOSABLE) ×8 IMPLANT
GOWN STRL NON-REIN LRG LVL3 (GOWN DISPOSABLE) IMPLANT
HANDPIECE INTERPULSE COAX TIP (DISPOSABLE) ×1
HOOD PEEL AWAY FACE SHEILD DIS (HOOD) ×6 IMPLANT
KIT BASIN OR (CUSTOM PROCEDURE TRAY) ×2 IMPLANT
KIT ROOM TURNOVER OR (KITS) ×2 IMPLANT
MANIFOLD NEPTUNE II (INSTRUMENTS) ×2 IMPLANT
NEEDLE 22X1 1/2 (OR ONLY) (NEEDLE) ×2 IMPLANT
NS IRRIG 1000ML POUR BTL (IV SOLUTION) ×2 IMPLANT
PACK TOTAL JOINT (CUSTOM PROCEDURE TRAY) ×2 IMPLANT
PAD ARMBOARD 7.5X6 YLW CONV (MISCELLANEOUS) ×2 IMPLANT
PADDING CAST COTTON 6X4 STRL (CAST SUPPLIES) IMPLANT
POSITIONER HEAD PRONE TRACH (MISCELLANEOUS) ×2 IMPLANT
SET HNDPC FAN SPRY TIP SCT (DISPOSABLE) ×1 IMPLANT
SPONGE GAUZE 4X4 12PLY (GAUZE/BANDAGES/DRESSINGS) IMPLANT
STAPLER VISISTAT 35W (STAPLE) ×2 IMPLANT
SUCTION FRAZIER TIP 10 FR DISP (SUCTIONS) ×2 IMPLANT
SUT BONE WAX W31G (SUTURE) ×2 IMPLANT
SUT VIC AB 0 CTB1 27 (SUTURE) ×4 IMPLANT
SUT VIC AB 1 CT1 27 (SUTURE) ×3
SUT VIC AB 1 CT1 27XBRD ANBCTR (SUTURE) ×3 IMPLANT
SUT VIC AB 2-0 CT1 27 (SUTURE) ×2
SUT VIC AB 2-0 CT1 TAPERPNT 27 (SUTURE) ×2 IMPLANT
SYR CONTROL 10ML LL (SYRINGE) ×2 IMPLANT
TOWEL OR 17X24 6PK STRL BLUE (TOWEL DISPOSABLE) ×2 IMPLANT
TOWEL OR 17X26 10 PK STRL BLUE (TOWEL DISPOSABLE) ×2 IMPLANT
TRAY FOLEY CATH 14FR (SET/KITS/TRAYS/PACK) IMPLANT
WATER STERILE IRR 1000ML POUR (IV SOLUTION) ×4 IMPLANT

## 2012-02-15 NOTE — Preoperative (Signed)
Beta Blockers   Reason not to administer Beta Blockers:Pt does not take B-blockers 

## 2012-02-15 NOTE — Plan of Care (Signed)
Problem: Consults Goal: Diagnosis- Total Joint Replacement Primary Total Knee Left     

## 2012-02-15 NOTE — Transfer of Care (Signed)
Immediate Anesthesia Transfer of Care Note  Patient: Timothy Mcdaniel  Procedure(s) Performed: Procedure(s) (LRB): TOTAL KNEE ARTHROPLASTY (Left)  Patient Location: PACU  Anesthesia Type: General  Level of Consciousness: awake and alert   Airway & Oxygen Therapy: Patient Spontanous Breathing and Patient connected to nasal cannula oxygen  Post-op Assessment: Report given to PACU RN, Post -op Vital signs reviewed and stable and Patient moving all extremities X 4  Post vital signs: Reviewed and stable  Complications: No apparent anesthesia complications

## 2012-02-15 NOTE — Anesthesia Procedure Notes (Addendum)
Anesthesia Regional Block:  Femoral nerve block  Pre-Anesthetic Checklist: ,, timeout performed, Correct Patient, Correct Site, Correct Laterality, Correct Procedure,, site marked, risks and benefits discussed, Surgical consent,  Pre-op evaluation,  At surgeon's request and post-op pain management  Laterality: Left  Prep: chloraprep       Needles:  Injection technique: Single-shot  Needle Type: Echogenic Stimulator Needle     Needle Length: 9cm  Needle Gauge: 21    Additional Needles:  Procedures: nerve stimulator Femoral nerve block  Nerve Stimulator or Paresthesia:  Response: Quadriceps muscle contraction, 0.45 mA,   Additional Responses:   Narrative:  Start time: 02/15/2012 7:00 AM End time: 02/15/2012 7:12 AM Injection made incrementally with aspirations every 5 mL.  Performed by: Personally  Anesthesiologist: Dr Chaney Malling  Additional Notes: Functioning IV was confirmed and monitors were applied.  A 90mm 21ga Arrow echogenic stimulator needle was used. Sterile prep and drape,hand hygiene and sterile gloves were used.  Negative aspiration and negative test dose prior to incremental administration of local anesthetic. The patient tolerated the procedure well.    Femoral nerve block Procedure Name: Intubation Date/Time: 02/15/2012 7:38 AM Performed by: Rossie Muskrat Pre-anesthesia Checklist: Patient identified, Timeout performed, Emergency Drugs available, Suction available and Patient being monitored Patient Re-evaluated:Patient Re-evaluated prior to inductionOxygen Delivery Method: Circle system utilized Preoxygenation: Pre-oxygenation with 100% oxygen Intubation Type: IV induction Ventilation: Mask ventilation without difficulty Laryngoscope Size: Miller and 2 Grade View: Grade I Tube type: Oral Tube size: 8.0 mm Number of attempts: 1 Airway Equipment and Method: Stylet Placement Confirmation: ETT inserted through vocal cords under direct vision,  breath sounds  checked- equal and bilateral,  positive ETCO2 and CO2 detector Secured at: 23 cm Tube secured with: Tape Dental Injury: Teeth and Oropharynx as per pre-operative assessment

## 2012-02-15 NOTE — Progress Notes (Signed)
Set pt up on nasal CPAP of 10cmH2O. 3L O2 bled in. No complications noted.

## 2012-02-15 NOTE — Op Note (Signed)
TOTAL KNEE REPLACEMENT OPERATIVE NOTE:  02/15/2012  2:11 PM  PATIENT:  Timothy Mcdaniel  66 y.o. male  PRE-OPERATIVE DIAGNOSIS:  osteoarthritis left knee  POST-OPERATIVE DIAGNOSIS:  osteoarthritis left knee  PROCEDURE:  Procedure(s): TOTAL KNEE ARTHROPLASTY  SURGEON:  Surgeon(s): Raymon Mutton, MD  PHYSICIAN ASSISTANT: Altamese Cabal, [facility]  ANESTHESIA:   general  DRAINS: Hemovac and On-Q Marcaine Pain Pump  SPECIMEN: None  COUNTS:  Correct  TOURNIQUET:   Total Tourniquet Time Documented: Thigh (Left) - 68 minutes  DICTATION:  Indication for procedure:    The patient is a 66 y.o. male who has failed conservative treatment for osteoarthritis left knee.  Informed consent was obtained prior to anesthesia. The risks versus benefits of the operation were explain and in a way the patient can, and did, understand.   Description of procedure:     The patient was taken to the operating room and placed under anesthesia.  The patient was positioned in the usual fashion taking care that all body parts were adequately padded and/or protected.  I foley catheter was not placed.  A tourniquet was applied and the leg prepped and draped in the usual sterile fashion.  The extremity was exsanguinated with the esmarch and tourniquet inflated to 350 mmHg.  Pre-operative range of motion was normal.  The knee was in 3 degree of mild varus.  A midline incision approximately 6-7 inches long was made with a #10 blade.  A new blade was used to make a parapatellar arthrotomy going 2-3 cm into the quadriceps tendon, over the patella, and alongside the medial aspect of the patellar tendon.  A synovectomy was then performed with the #10 blade and forceps. I then elevated the deep MCL off the medial tibial metaphysis subperiosteally around to the semimembranosus attachment.    I everted the patella and used calipers to measure patellar thickness.  I used the reamer to ream down to appropriate thickness to  recreate the native thickness.  I then removed excess bone with the rongeur and sagittal saw.  I used the appropriately sized template and drilled the three lug holes.  I then put the trial in place and measured the thickness with the calipers to ensure recreation of the native thickness.  The trial was then removed and the patella subluxed and the knee brought into flexion.  A homan retractor was place to retract and protect the patella and lateral structures.  A Z-retractor was place medially to protect the medial structures.  The extra-medullary alignment system was used to make cut the tibial articular surface perpendicular to the anamotic axis of the tibia and in 3 degrees of posterior slope.  The cut surface and alignment jig was removed.  I then used the intramedullary alignment guide to make a 6 valgus cut on the distal femur.  I then marked out the epicondylar axis on the distal femur.  The posterior condylar axis measured 3 degrees.  I then used the anterior referencing sizer and measured the femur to be a size E.  The 4-In-1 cutting block was screwed into place in external rotation matching the posterior condylar angle, making our cuts perpendicular to the epicondylar axis.  Anterior, posterior and chamfer cuts were made with the sagittal saw.  The cutting block and cut pieces were removed.  A lamina spreader was placed in 90 degrees of flexion.  The ACL, PCL, menisci, and posterior condylar osteophytes were removed.  A 10 mm spacer blocked was found to offer good  flexion and extension gap balance after minimal in degree releasing.   The scoop retractor was then placed and the femoral finishing block was pinned in place.  The small sagittal saw was used as well as the lug drill to finish the femur.  The block and cut surfaces were removed and the medullary canal hole filled with autograft bone from the cut pieces.  The tibia was delivered forward in deep flexion and external rotation.  A size 5  tray was selected and pinned into place centered on the medial 1/3 of the tibial tubercle.  The reamer and keel was used to prepare the tibia through the tray.    I then trialed with the size E femur, size 5 tibia, a 10 mm insert and the 35 patella.  I had excellent flexion/extension gap balance, excellent patella tracking.  Flexion was full and beyond 120 degrees; extension was zero.  These components were chosen and the staff opened them to me on the back table while the knee was lavaged copiously and the cement mixed.  I cemented in the components and removed all excess cement.  The polyethylene tibial component was snapped into place and the knee placed in extension while cement was hardening.  The capsule was infilltrated with 20cc of .25% Marcaine with epinephrine.  A hemovac was place in the joint exiting superolaterally.  A pain pump was place superomedially superficial to the arthrotomy.  Once the cement was hard, the tourniquet was let down.  Hemostasis was obtained.  The arthrotomy was closed with figure-8 #1 vicryl sutures.  The deep soft tissues were closed with #0 vicryls and the subcuticular layer closed with a running #2-0 vicryl.  The skin was reapproximated and closed with skin staples.  The wound was dressed with xeroform, 4 x4's, 2 ABD sponges, a single layer of webril and a TED stocking.   The patient was then awakened, extubated, and taken to the recovery room in stable condition.  BLOOD LOSS:  300cc DRAINS: 1 hemovac, 1 pain catheter COMPLICATIONS:  None.  PLAN OF CARE: Admit to inpatient   PATIENT DISPOSITION:  PACU - hemodynamically stable.   Delay start of Pharmacological VTE agent (>24hrs) due to surgical blood loss or risk of bleeding:  not applicable  Please fax a copy of this op note to my office at [phone] (please only include page 1 and 2 of the Case Information op note)

## 2012-02-15 NOTE — Progress Notes (Signed)
Orthopedic Tech Progress Note Patient Details:  GURTEJ NOYOLA 04-Oct-1946 [id number] Trapeze bar     Cammer, Mickie Bail 02/15/2012, 10:50 AM

## 2012-02-15 NOTE — Anesthesia Postprocedure Evaluation (Signed)
Anesthesia Post Note  Patient: Timothy Mcdaniel  Procedure(s) Performed: Procedure(s) (LRB): TOTAL KNEE ARTHROPLASTY (Left)  Anesthesia type: General  Patient location: PACU  Post pain: Pain level controlled and Adequate analgesia  Post assessment: Post-op Vital signs reviewed, Patient's Cardiovascular Status Stable, Respiratory Function Stable, Patent Airway and Pain level controlled  Last Vitals:  Filed Vitals:   02/15/12 0604  BP: 131/82  Pulse: 79  Temp: 36.6 C  Resp: 18    Post vital signs: Reviewed and stable  Level of consciousness: awake, alert  and oriented  Complications: No apparent anesthesia complications

## 2012-02-15 NOTE — Anesthesia Preprocedure Evaluation (Addendum)
Anesthesia Evaluation  Patient identified by MRN, date of birth, ID band Patient awake    Reviewed: Allergy & Precautions, H&P , NPO status , Patient's Chart, lab work & pertinent test results  Airway Mallampati: II TM Distance: >3 FB Neck ROM: full    Dental   Pulmonary shortness of breath (cpap o2 @ night) and Long-Term Oxygen Therapy, sleep apnea (CPAP @ night) , Recent URI , Resolved, former smoker (Quit 1995)         Cardiovascular hypertension, Pt. on medications     Neuro/Psych CVA, No Residual Symptoms    GI/Hepatic   Endo/Other  Morbid obesity  Renal/GU      Musculoskeletal   Abdominal   Peds  Hematology   Anesthesia Other Findings   Reproductive/Obstetrics                          Anesthesia Physical Anesthesia Plan  ASA: II  Anesthesia Plan: General and Regional   Post-op Pain Management: MAC Combined w/ Regional for Post-op pain   Induction: Intravenous  Airway Management Planned: Oral ETT  Additional Equipment:   Intra-op Plan:   Post-operative Plan: Extubation in OR  Informed Consent: I have reviewed the patients History and Physical, chart, labs and discussed the procedure including the risks, benefits and alternatives for the proposed anesthesia with the patient or authorized representative who has indicated his/her understanding and acceptance.     Plan Discussed with: CRNA and Surgeon  Anesthesia Plan Comments:         Anesthesia Quick Evaluation

## 2012-02-15 NOTE — Progress Notes (Signed)
Orthopedic Tech Progress Note Patient Details:  Timothy Mcdaniel 1946/02/04 [id number]  CPM Left Knee CPM Left Knee: On Left Knee Flexion (Degrees): 90  Left Knee Extension (Degrees): 0    Shawnie Pons 02/15/2012, 10:49 AM

## 2012-02-15 NOTE — H&P (Signed)
Timothy Mcdaniel MRN:  [id number] DOB/SEX:  [birth year]/male  CHIEF COMPLAINT:  Painful left Knee  HISTORY: Patient is a 66 y.o. male presented with a history of pain in the left knee. Onset of symptoms was gradual starting several years ago with gradually worsening course since that time. The patient noted no past surgery on the left knee. Prior procedures on the knee include . Patient has been treated conservatively with over-the-counter NSAIDs and activity modification. Patient currently rates pain in the knee at 8 out of 10 with activity. There is pain at night.  PAST MEDICAL HISTORY: There are no active problems to display for this patient.  Past Medical History  Diagnosis Date  . Hypertension   . Stroke     ministroke  . Shortness of breath   . Recurrent upper respiratory infection (URI)     DEC. had flu shot- followed by bad cold, treated /w OTC  . Arthritis     OA- both knees , L shoulder    Past Surgical History  Procedure Date  . Mandible fracture surgery     1960's - occupational injury     MEDICATIONS:   Prescriptions prior to admission  Medication Sig Dispense Refill  . aspirin EC 81 MG tablet Take 81 mg by mouth daily.      . fish oil-omega-3 fatty acids 1000 MG capsule Take 1,200 mg by mouth daily.      . hydrochlorothiazide (MICROZIDE) 12.5 MG capsule Take 12.5 mg by mouth daily.      Marland Kitchen lisinopril (PRINIVIL,ZESTRIL) 40 MG tablet Take 40 mg by mouth daily.      . Multiple Vitamin (MULITIVITAMIN WITH MINERALS) TABS Take 1 tablet by mouth daily.      . nabumetone (RELAFEN) 500 MG tablet Take 500 mg by mouth 2 (two) times daily.      . traMADol (ULTRAM) 50 MG tablet Take 50 mg by mouth every 6 (six) hours as needed. For pain      . Travoprost, BAK Free, (TRAVATAN) 0.004 % SOLN ophthalmic solution Place 1 drop into both eyes at bedtime.        ALLERGIES:  No Known Allergies  REVIEW OF SYSTEMS:  Pertinent items are noted in HPI.   FAMILY HISTORY:   Family  History  Problem Relation Age of Onset  . Anesthesia problems Neg Hx   . Hypotension Neg Hx   . Malignant hyperthermia Neg Hx   . Pseudochol deficiency Neg Hx     SOCIAL HISTORY:   History  Substance Use Topics  . Smoking status: Former Smoker    Quit date: 02/03/1994  . Smokeless tobacco: Not on file  . Alcohol Use: No     EXAMINATION:  Vital signs in last 24 hours: Temp:  [97.9 F (36.6 C)] 97.9 F (36.6 C) (03/04 0604) Pulse Rate:  [79] 79  (03/04 0604) Resp:  [18] 18  (03/04 0604) BP: (131)/(82) 131/82 mmHg (03/04 0604) SpO2:  [95 %] 95 % (03/04 0604)  General appearance: alert, cooperative and no distress Head: Normocephalic, without obvious abnormality, atraumatic Lungs: clear to auscultation bilaterally Heart: regular rate and rhythm, S1, S2 normal, no murmur, click, rub or gallop Abdomen: soft, non-tender; bowel sounds normal; no masses,  no organomegaly Extremities: extremities normal, atraumatic, no cyanosis or edema and Homans sign is negative, no sign of DVT Pulses: 2+ and symmetric Skin: Skin color, texture, turgor normal. No rashes or lesions Neurologic: Alert and oriented X 3, normal strength and tone. Normal  symmetric reflexes. Normal coordination and gait  Musculoskeletal:  ROM 0-110, Ligaments intact,  Imaging Review Plain radiographs demonstrate severe degenerative joint disease of the left knee. The overall alignment is mild varus. The bone quality appears to be good for age and reported activity level.  Assessment/Plan: End stage arthritis, left knee   The patient history, physical examination and imaging studies are consistent with advanced degenerative joint disease of the left knee. The patient has failed conservative treatment.  The clearance notes were reviewed.  After discussion with the patient it was felt that Total Knee Replacement was indicated. The procedure,  risks, and benefits of total knee arthroplasty were presented and reviewed. The  risks including but not limited to aseptic loosening, infection, blood clots, vascular injury, stiffness, patella tracking problems complications among others were discussed. The patient acknowledged the explanation, agreed to proceed with the plan.  Tyonna Talerico 02/15/2012, 7:27 AM

## 2012-02-16 LAB — COMPREHENSIVE METABOLIC PANEL
ALT: 22 U/L (ref 0–53)
AST: 26 U/L (ref 0–37)
Albumin: 3.6 g/dL (ref 3.5–5.2)
Alkaline Phosphatase: 52 U/L (ref 39–117)
Creatinine, Ser: 0.89 mg/dL (ref 0.50–1.35)
GFR calc Af Amer: >90 mL/min (ref >90)
GFR calc non Af Amer: 87 mL/min — ABNORMAL LOW (ref >90)
Glucose, Bld: 115 mg/dL — ABNORMAL HIGH (ref 70–99)
Potassium: 4.5 mEq/L (ref 3.5–5.1)
Sodium: 142 mEq/L (ref 135–145)
Total Protein: 6.8 g/dL (ref 6.0–8.3)

## 2012-02-16 NOTE — Evaluation (Signed)
Occupational Therapy Evaluation Patient Details Name: Timothy Mcdaniel MRN: [id number] DOB: 1946-10-15 Today's Date: 02/16/2012  Problem List: There is no problem list on file for this patient.   Past Medical History:  Past Medical History  Diagnosis Date  . Hypertension   . Stroke     ministroke  . Shortness of breath   . Recurrent upper respiratory infection (URI)     DEC. had flu shot- followed by bad cold, treated /w OTC  . Arthritis     OA- both knees , L shoulder    Past Surgical History:  Past Surgical History  Procedure Date  . Mandible fracture surgery     1960's - occupational injury    OT Assessment/Plan/Recommendation OT Assessment Clinical Impression Statement: Pt. presents s/p Lt. TKA and with increased pain thus resulting with below problem list. Pt. will benefit from skilled OT to increase functional independence to supervision level at D/C home. OT Recommendation/Assessment: Patient will need skilled OT in the acute care venue OT Problem List: Decreased strength;Decreased activity tolerance;Impaired balance (sitting and/or standing);Decreased safety awareness;Decreased knowledge of use of DME or AE;Decreased knowledge of precautions;Pain Barriers to Discharge: None OT Therapy Diagnosis : Generalized weakness;Acute pain OT Plan OT Frequency: Min 2X/week OT Treatment/Interventions: Self-care/ADL training;DME and/or AE instruction;Therapeutic activities;Patient/family education;Balance training OT Recommendation Follow Up Recommendations: Home health OT;Supervision - Intermittent Equipment Recommended: Tub/shower bench Individuals Consulted Consulted and Agree with Results and Recommendations: Patient OT Goals Acute Rehab OT Goals OT Goal Formulation: With patient Time For Goal Achievement: 7 days ADL Goals Pt Will Perform Lower Body Bathing: with set-up;with supervision;Sit to stand from chair ADL Goal: Lower Body Bathing - Progress: Goal set today Pt  Will Perform Lower Body Dressing: with set-up;with supervision;Sit to stand from chair;with adaptive equipment ADL Goal: Lower Body Dressing - Progress: Goal set today Pt Will Transfer to Toilet: with supervision;with DME;3-in-1;Maintaining weight bearing status ADL Goal: Toilet Transfer - Progress: Goal set today Pt Will Perform Tub/Shower Transfer: Tub transfer;with supervision;with DME;Transfer tub bench ADL Goal: Tub/Shower Transfer - Progress: Goal set today  OT Evaluation Precautions/Restrictions  Precautions Precautions: Knee Restrictions Weight Bearing Restrictions: Yes LLE Weight Bearing: Weight bearing as tolerated Prior Functioning Home Living Lives With: Spouse Type of Home: House Home Layout: Two level;Able to live on main level with bedroom/bathroom Alternate Level Stairs-Rails: None Home Access: Stairs to enter Entrance Stairs-Rails: None Entrance Stairs-Number of Steps: 3-4 Bathroom Shower/Tub: Tub/shower unit;Door [employer] Toilet: Handicapped height Bathroom Accessibility: Yes How Accessible: Accessible via walker Home Adaptive Equipment: Bedside commode/3-in-1;Walker - rolling Prior Function Level of Independence: Independent with basic ADLs;Independent with gait;Independent with transfers Able to Take Stairs?: Yes Vocation: Retired ADL ADL Eating/Feeding: Performed;Independent Where Assessed - Eating/Feeding: Chair Grooming: Performed;Wash/dry hands;Wash/dry face;Teeth care;Set up;Minimal assistance Grooming Details (indicate cue type and reason): Min verbal cues for RW Where Assessed - Grooming: Standing at sink Upper Body Bathing: Simulated;Chest;Right arm;Left arm;Abdomen;Set up Where Assessed - Upper Body Bathing: Sitting, chair Lower Body Bathing: Simulated;Maximal assistance Where Assessed - Lower Body Bathing: Sit to stand from chair Upper Body Dressing: Performed;Minimal assistance Upper Body Dressing Details (indicate cue type and reason): With  donning gown Where Assessed - Upper Body Dressing: Sitting, chair Lower Body Dressing: Simulated;Maximal assistance Where Assessed - Lower Body Dressing: Sit to stand from chair Toilet Transfer: Simulated;Moderate assistance Toilet Transfer Details (indicate cue type and reason): Mod verbal cues for hand placement and technique Toilet Transfer Method: Ambulating Toilet Transfer Equipment: Other (comment) (recliner) Toileting - Clothing Manipulation: Simulated;Minimal  assistance Where Assessed - Toileting Clothing Manipulation: Sit to stand from 3-in-1 or toilet Toileting - Hygiene: Simulated;Minimal assistance Cognition Cognition Arousal/Alertness: Awake/alert Overall Cognitive Status: Appears within functional limits for tasks assessed Orientation Level: Oriented X4 Sensation/Coordination Sensation Light Touch: Appears Intact Extremity Assessment RUE Assessment RUE Assessment: Within Functional Limits LUE Assessment LUE Assessment: Within Functional Limits Mobility  Bed Mobility Bed Mobility: Yes Supine to Sit: 2: Max assist;With rails;HOB elevated (Comment degrees) Supine to Sit Details (indicate cue type and reason): assist for trunk elevation and R LE management, pt initiated well but required maxA to scoot to EOB Transfers Sit to Stand: 3: Mod assist;From bed Sit to Stand Details (indicate cue type and reason): max verbal cues for safety, hand placement, and sequencing.    End of Session OT - End of Session Equipment Utilized During Treatment: Gait belt Activity Tolerance: Patient tolerated treatment well Patient left: in chair;with call bell in reach Nurse Communication: Mobility status for transfers General Behavior During Session: [facility] for tasks performed Cognition: [facility] for tasks performed   Yolette Hastings, OTR/L Pager [phone] 02/16/2012, 12:01 PM

## 2012-02-16 NOTE — Progress Notes (Signed)
Physical Therapy Treatment Note   02/16/12 1357  PT Visit Information  Last PT Received On 02/16/12  Precautions  Precautions Knee  Restrictions  LLE Weight Bearing WBAT  Bed Mobility  Supine to Sit 2: Max assist  Supine to Sit Details (indicate cue type and reason) assist for trunk elevation due to no trapeze  Transfers  Sit to Stand 3: Mod assist  Sit to Stand Details (indicate cue type and reason) verbal cues for hand placement and safety  Ambulation/Gait  Ambulation/Gait Assistance 4: Min assist  Ambulation/Gait Assistance Details (indicate cue type and reason) max verbal cues for safety and to decrease pace to allow for increased L LE WBing  Ambulation Distance (Feet) 100 Feet  Assistive device Rolling walker  Gait Pattern Step-to pattern;Decreased step length - left;Decreased stance time - left (transitioned to step-through)  Gait velocity max verbal cues to decrease speed due to decreased safety  Stairs No  Wheelchair Mobility  Wheelchair Mobility No  Posture/Postural Control  Posture/Postural Control No significant limitations  Total Joint Exercises  Quad Sets AROM;Left;10 reps;Supine  Heel Slides AROM;Left;10 reps;Supine (achieve approx 70 degrees of L knee flex)  PT - End of Session  Equipment Utilized During Treatment Gait belt  Activity Tolerance Patient tolerated treatment well  Patient left in chair;with call bell in reach;with family/visitor present  Nurse Communication Mobility status for ambulation  General  Behavior During Session [facility] for tasks performed  Cognition (impulsive, decreased safety awareness)  PT - Assessment/Plan  Comments on Treatment Session Patient with improved ambulation tolerance however compensated with increased bilat UE weight-bearing. pt impulsive with decreased safety awarenes requiring freq verbal cues for safety.  PT Plan Discharge plan remains appropriate;Frequency remains appropriate  PT Frequency 7X/week  Follow Up  Recommendations Home health PT;Supervision/Assistance - 24 hour  Equipment Recommended None recommended by PT  Acute Rehab PT Goals  PT Goal: Supine/Side to Sit - Progress Progressing toward goal  PT Goal: Sit to Stand - Progress Progressing toward goal  PT Goal: Ambulate - Progress Progressing toward goal  PT Goal: Perform Home Exercise Program - Progress Progressing toward goal    Pain: 5/10, L knee  Lewis Shock, PT, DPT Pager #: [phone] Office #: [phone]

## 2012-02-16 NOTE — Progress Notes (Signed)
CARE MANAGEMENT NOTE 02/16/2012      Action/Plan:   Discharge planning. spoke with patient, choice offered. Preoperatively setup with [facility], no changes. Rolling walker, 3in1 and CPM have been delivered to the home.   Anticipated DC Date:  02/18/2012   Anticipated DC Plan:  HOME W HOME HEALTH SERVICES      DC Planning Services  CM consult      [facility] Choice  HOME HEALTH   Choice offered to / List presented to:  C-1 Patient   DME arranged  NA      DME agency  NA     HH arranged  HH-2 PT      [facility] agency  [facility]   Status of service:  Completed, signed off     Discharge Disposition:  HOME W HOME HEALTH SERVICES

## 2012-02-16 NOTE — Progress Notes (Signed)
Physical Therapy Evaluation Note  Past Medical History  Diagnosis Date  . Hypertension   . Stroke     ministroke  . Shortness of breath   . Recurrent upper respiratory infection (URI)     DEC. had flu shot- followed by bad cold, treated /w OTC  . Arthritis     OA- both knees , L shoulder     Past Surgical History  Procedure Date  . Mandible fracture surgery     1960's - occupational injury     02/16/12 0806  PT Visit Information  Last PT Received On 02/16/12  Precautions  Precautions Knee  Restrictions  LLE Weight Bearing WBAT  Home Living  Lives With Spouse  Type of Home House  Home Layout Two level;Able to live on main level with bedroom/bathroom  Alternate Level Stairs-Rails None  Home Access Stairs to enter  Entrance Stairs-Rails None  Entrance Stairs-Number of Steps 3-4  Bathroom Shower/Tub Tub/shower unit;Door  [employer] Handicapped height  Home Adaptive Equipment Bedside commode/3-in-1;Walker - rolling  Prior Function  Level of Independence Independent with basic ADLs;Independent with gait;Independent with transfers  Able to Take Stairs? Yes  Vocation Retired  Public house manager  Overall Cognitive Status Appears within functional limits for tasks assessed  Orientation Level Oriented X4  Sensation  Light Touch Appears Intact  Bed Mobility  Bed Mobility Yes  Supine to Sit 2: Max assist;With rails;HOB elevated (Comment degrees) (HOB=30)  Supine to Sit Details (indicate cue type and reason) assist for trunk elevation and R LE management, pt initiated well but required maxA to scoot to EOB  Transfers  Transfers Yes  Sit to Stand 3: Mod assist;From bed  Sit to Stand Details (indicate cue type and reason) max verbal cues for safety, hand placement, and sequencing.   Ambulation/Gait  Ambulation/Gait Yes  Ambulation/Gait Assistance 4: Min assist  Ambulation/Gait Assistance Details (indicate cue type and reason) verbal cues for  sequencing, v/c's to achieve L quad set to prevent buckling  Ambulation Distance (Feet) 10 Feet (x2 (to/from bathroom))  Assistive device Rolling walker  Gait Pattern Step-to pattern;Decreased step length - left;Decreased stance time - left  Gait velocity decreased  Stairs No  Wheelchair Mobility  Wheelchair Mobility No  Posture/Postural Control  Posture/Postural Control No significant limitations  Balance  Balance Assessed No  RUE Assessment  RUE Assessment WFL  LUE Assessment  LUE Assessment WFL  RLE Assessment  RLE Assessment WFL  LLE Assessment  LLE Assessment Not tested (pt s/p L TKA)  Cervical Assessment  Cervical Assessment [facility]  Thoracic Assessment  Thoracic Assessment [facility]  Lumbar Assessment  Lumbar Assessment [facility]  Exercises  Exercises Total Joint  Total Joint Exercises  Ankle Circles/Pumps AROM;Both;10 reps;Supine  Quad Sets AROM;Left;10 reps;Supine  Heel Slides AAROM;Left;10 reps;Supine  PT - End of Session  Equipment Utilized During Treatment Gait belt  Activity Tolerance Patient tolerated treatment well  Patient left in chair;with call bell in reach  Nurse Communication Mobility status for transfers;Mobility status for ambulation  CPM Left Knee  CPM Left Knee Off  General  Behavior During Session [facility] for tasks performed  Cognition [facility] for tasks performed  PT Assessment  Clinical Impression Statement Patient s/p L TKA presenting with decreased active and passive L KNEE ROM and decreased L LE strength. Patient tolerated treatment well and will most likely be able to return home with spouse and HHPT.  PT Recommendation/Assessment Patient will need skilled PT in the acute care venue  PT  Problem List Decreased strength;Decreased range of motion;Decreased activity tolerance;Decreased balance;Decreased mobility  PT Therapy Diagnosis  Difficulty walking;Abnormality of gait;Generalized weakness;Acute pain  PT Plan  PT Frequency 7X/week  PT Recommendation  Follow  Up Recommendations Home health PT;Supervision/Assistance - 24 hour  Equipment Recommended None recommended by PT (already delivered by [facility] agency)  Individuals Consulted  Consulted and Agree with Results and Recommendations Patient  Acute Rehab PT Goals  PT Goal Formulation With patient  Time For Goal Achievement 7 days  Pt will go Supine/Side to Sit with modified independence;with HOB 0 degrees  PT Goal: Supine/Side to Sit - Progress Goal set today  Pt will go Sit to Stand with modified independence (up to RW)  PT Goal: Sit to Stand - Progress Goal set today  Pt will Ambulate >150 feet;with modified independence;with rolling walker  PT Goal: Ambulate - Progress Goal set today  Pt will Go Up / Down Stairs 3-5 stairs;with supervision;with rolling walker  PT Goal: Up/Down Stairs - Progress Goal set today  Pt will Perform Home Exercise Program Independently  PT Goal: Perform Home Exercise Program - Progress Goal set today    Pain: 5/10 L LE surgical pain  Lewis Shock, PT, DPT Pager #: [phone] Office #: [phone]

## 2012-02-16 NOTE — Progress Notes (Signed)
Georgena Spurling, MD   Altamese Cabal, PA-C [address] [place], [place], [place]  [address]                             [phone]   PROGRESS NOTE  Subjective:  negative for Chest Pain  negative for Shortness of Breath  negative for Nausea/Vomiting   negative for Calf Pain  negative for Bowel Movement   Tolerating Diet: yes         Patient reports pain as 5 on 0-10 scale.    Objective: Vital signs in last 24 hours:   Patient Vitals for the past 24 hrs:  BP Temp Temp src Pulse Resp SpO2  02/16/12 0546 142/79 mmHg 97.9 F (36.6 C) - 88  18  97 %  02/16/12 0203 138/65 mmHg 98.4 F (36.9 C) - 96  18  98 %  02/15/12 2114 148/79 mmHg 98.1 F (36.7 C) Oral 87  18  98 %  02/15/12 1415 128/73 mmHg 98.6 F (37 C) Oral 91  18  98 %  02/15/12 1315 130/75 mmHg 98.1 F (36.7 C) Oral 90  18  96 %  02/15/12 1215 140/88 mmHg 97.5 F (36.4 C) Oral 82  18  100 %  02/15/12 1210 140/88 mmHg 97.5 F (36.4 C) Oral 82  18  100 %  02/15/12 1134 145/92 mmHg - - - - -  02/15/12 1130 - - - 80  13  99 %  02/15/12 1119 136/79 mmHg - - - - -  02/15/12 1115 - 97.1 F (36.2 C) - 77  8  97 %  02/15/12 1105 127/77 mmHg - - - - -  02/15/12 1100 - - - 87  14  99 %  02/15/12 1049 139/84 mmHg - - - - -  02/15/12 1045 - - - 87  24  99 %  02/15/12 1034 145/97 mmHg - - - - -  02/15/12 1030 - - - 90  25  99 %  02/15/12 1019 137/81 mmHg - - - - -  02/15/12 1015 - - - 91  31  100 %  02/15/12 1004 125/86 mmHg - - - - -  02/15/12 1000 - - - 97  24  99 %  02/15/12 0949 117/84 mmHg 96.9 F (36.1 C) - 98  23  88 %    @flow {1959:LAST@   Intake/Output from previous day:   03/04 0701 - 03/05 0700 In: 3491.3 [P.O.:720; I.V.:2771.3] Out: 2275 [Urine:1450; Drains:750]   Intake/Output this shift:       Intake/Output      03/04 0701 - 03/05 0700 03/05 0701 - 03/06 0700   P.O. 720    I.V. 2771.3    Total Intake 3491.3    Urine 1450    Drains 750    Blood 75    Total Output 2275    Net +1216.3         Urine Occurrence 3 x       LABORATORY DATA: No results found for this basename: WBC:7,HGB:7,HCT:7,PLT:7 in the last 168 hours  Basename 02/16/12 0432  NA 142  K 4.5  CL 105  CO2 29  BUN 11  CREATININE 0.89  GLUCOSE 115*  CALCIUM 9.3   Lab Results  Component Value Date   INR 1.03 02/04/2012    Examination:  General appearance: alert, cooperative and no distress Extremities: Homans sign is negative, no sign of DVT  Wound  Exam: clean, dry, intact   Drainage:  Scant/small amount Serosanguinous exudate  Motor Exam: EHL and FHL Intact  Sensory Exam: Deep Peroneal normal  Vascular Exam:    Assessment:    1 Day Post-Op  Procedure(s) (LRB): TOTAL KNEE ARTHROPLASTY (Left)  ADDITIONAL DIAGNOSIS:  Active Problems:  * No active hospital problems. *   Acute Blood Loss Anemia   Plan: Physical Therapy as ordered Weight Bearing as Tolerated (WBAT)  DVT Prophylaxis:  Lovenox  DISCHARGE PLAN: Home  DISCHARGE NEEDS: HHPT, CPM, Walker and 3-in-1 comode seat         Caedyn Raygoza 02/16/2012, 8:24 AM

## 2012-02-17 ENCOUNTER — Encounter (HOSPITAL_COMMUNITY): Payer: Self-pay | Admitting: Orthopedic Surgery

## 2012-02-17 LAB — COMPREHENSIVE METABOLIC PANEL
BUN: 11 mg/dL (ref 6–23)
CO2: 28 mEq/L (ref 19–32)
Calcium: 9.2 mg/dL (ref 8.4–10.5)
Creatinine, Ser: 0.96 mg/dL (ref 0.50–1.35)
GFR calc Af Amer: >90 mL/min (ref >90)
GFR calc non Af Amer: 84 mL/min — ABNORMAL LOW (ref >90)
Glucose, Bld: 127 mg/dL — ABNORMAL HIGH (ref 70–99)
Total Bilirubin: 0.5 mg/dL (ref 0.3–1.2)

## 2012-02-17 MED ORDER — OXYCODONE HCL 5 MG PO TABS: 5.0000 mg | ORAL_TABLET | ORAL | Status: AC | PRN

## 2012-02-17 MED ORDER — ENOXAPARIN SODIUM 40 MG/0.4ML ~~LOC~~ SOLN: 40.0000 mg | SUBCUTANEOUS | Status: DC

## 2012-02-17 MED ORDER — METHOCARBAMOL 500 MG PO TABS: 500.0000 mg | ORAL_TABLET | Freq: Four times a day (QID) | ORAL | Status: AC | PRN

## 2012-02-17 MED ORDER — OXYCODONE HCL 20 MG PO TB12: 20.0000 mg | ORAL_TABLET | Freq: Two times a day (BID) | ORAL | Status: AC

## 2012-02-17 NOTE — Discharge Instructions (Signed)
Diet: As you were doing prior to hospitalization   Activity:  Increase activity slowly as tolerated                  No lifting or driving for 6 weeks  Shower:  May shower without a dressing once there is no drainage from your wound.                 Do NOT wash over the wound.  If drainage remains, cover wound with saran                  Wrap and then shower.  Clean incision with betadine and change dressing                        After saran wrap removed.  Dressing:  You may change your dressing on Thursday                    Then change the dressing daily with sterile 4"x4"s gauze dressing                     And TED hose for knees.  Use paper tape to hold dressing in place                     For hips.  You may clean the incision with alcohol prior to redressing.  Weight Bearing:  Weight bearing as tolerated as taught in physical therapy.  Use a                                walker or Crutches as instructed.  To prevent constipation: you may use a stool softener such as -               Colace ( over the counter) 100 mg by mouth twice a day                Drink plenty of fluids ( prune juice may be helpful) and high fiber foods                Miralax ( over the counter) for constipation as needed.    Precautions:  If you experience chest pain or shortness of breath - call 911 immediately               For transfer to the hospital emergency department!!               If you develop a fever greater that 101 F, purulent drainage from wound,                             increased redness or drainage from wound, or calf pain -- Call the office at                                                 [phone].  Follow- Up Appointment:  Please call for an appointment to be seen on 03/01/12  [place] - [phone]                   

## 2012-02-17 NOTE — Progress Notes (Signed)
Instructed pt on how to admin Lovenox injections. Pt verbalized understanding.

## 2012-02-17 NOTE — Discharge Summary (Signed)
PATIENT ID:      Timothy Mcdaniel  MRN:     [id number] DOB/AGE:    [birth year] / 66 y.o.     DISCHARGE SUMMARY  ADMISSION DATE:    02/15/2012 DISCHARGE DATE:   02/17/2012   ADMISSION DIAGNOSIS: osteoarthritis left knee  (osteoarthritis left knee)  DISCHARGE DIAGNOSIS:  osteoarthritis left knee    ADDITIONAL DIAGNOSIS: Active Problems:  * No active hospital problems. *   Past Medical History  Diagnosis Date  . Hypertension   . Stroke     ministroke  . Shortness of breath   . Recurrent upper respiratory infection (URI)     DEC. had flu shot- followed by bad cold, treated /w OTC  . Arthritis     OA- both knees , L shoulder     PROCEDURE: Procedure(s): TOTAL KNEE ARTHROPLASTY on 02/15/2012  CONSULTS:     HISTORY:  See H&P in chart  HOSPITAL COURSE:  Timothy Mcdaniel is a 66 y.o. admitted on 02/15/2012 and found to have a diagnosis of osteoarthritis left knee.  After appropriate laboratory studies were obtained  they were taken to the operating room on 02/15/2012 and underwent Procedure(s): TOTAL KNEE ARTHROPLASTY.   They were given perioperative antibiotics:  Anti-infectives     Start     Dose/Rate Route Frequency Ordered Stop   02/15/12 1400   ceFAZolin (ANCEF) IVPB 2 g/50 mL premix        2 g 100 mL/hr over 30 Minutes Intravenous Every 6 hours 02/15/12 1255 02/16/12 0211   02/14/12 1130   ceFAZolin (ANCEF) IVPB 2 g/50 mL premix        2 g 100 mL/hr over 30 Minutes Intravenous 60 min pre-op 02/14/12 1119 02/15/12 0736        . Blood products given:none   The remainder of the hospital course was dedicated to ambulation and strengthening.   The patient was discharged on 2 Days Post-Op in  Good condition.   DIAGNOSTIC STUDIES: Recent vital signs: Patient Vitals for the past 24 hrs:  BP Temp Pulse Resp SpO2  02/17/12 0614 128/70 mmHg 97.3 F (36.3 C) 69  19  97 %  02/16/12 2228 130/66 mmHg 97.7 F (36.5 C) 103  20  93 %  02/16/12 1341 119/63 mmHg 99.2 F (37.3 C) 90   18  98 %       Recent laboratory studies: No results found for this basename: WBC:7,HGB:7,HCT:7,PLT:7 in the last 168 hours  Basename 02/17/12 0518 02/16/12 0432  NA 138 142  K 4.1 4.5  CL 102 105  CO2 28 29  BUN 11 11  CREATININE 0.96 0.89  GLUCOSE 127* 115*  CALCIUM 9.2 9.3   Lab Results  Component Value Date   INR 1.03 02/04/2012     Recent Radiographic Studies :  Dg Chest 2 View  02/04/2012  *RADIOLOGY REPORT*  Clinical Data: Preoperative evaluation for left knee arthroplasty. Hypertension, shortness of breath, recurrent URI in January. 20 pack year history of smoking with cessation in 1995  CHEST - 2 VIEW  Comparison: None.  Findings: Heart and mediastinal contours are within normal limits. The lung fields are clear with no signs of focal infiltrate or congestive failure.  No pleural fluid or significant peribronchial cuffing is seen.  Bony structures demonstrate degenerative osteophytosis of the mid and lower thoracic spine and are otherwise intact.  IMPRESSION: No worrisome focal or acute cardiopulmonary abnormality seen  Original Report Authenticated By: Bertha Stakes, M.D.  DISCHARGE INSTRUCTIONS: Discharge Orders    Future Orders Please Complete By Expires   Diet - low sodium heart healthy      Call MD / Call 911      Comments:   If you experience chest pain or shortness of breath, CALL 911 and be transported to the hospital emergency room.  If you develope a fever above 101 F, pus (white drainage) or increased drainage or redness at the wound, or calf pain, call your surgeon's office.   Constipation Prevention      Comments:   Drink plenty of fluids.  Prune juice may be helpful.  You may use a stool softener, such as Colace (over the counter) 100 mg twice a day.  Use MiraLax (over the counter) for constipation as needed.   Increase activity slowly as tolerated      Weight Bearing as taught in Physical Therapy      Comments:   Use a walker or crutches as  instructed.   Driving restrictions      Comments:   No driving for 6 weeks   Lifting restrictions      Comments:   No lifting for 6 weeks   Do not put a pillow under the knee. Place it under the heel.      Change dressing      Comments:   Change dressing on thursday, then change the dressing daily with sterile 4 x 4 inch gauze dressing and apply TED hose.  You may clean the incision with alcohol prior to redressing.   TED hose      Comments:   Use stockings (TED hose) for 3 weeks on both leg(s).  You may remove them at night for sleeping.   CPM      Comments:   Continuous passive motion machine (CPM):      Use the CPM from 0 to 90 for 6-8 hours per day.      You may increase by 10 per day.  You may break it up into 2 or 3 sessions per day.      Use CPM for 2 weeks or until you are told to stop.      DISCHARGE MEDICATIONS:   Medication List  As of 02/17/2012  6:56 AM   STOP taking these medications         aspirin EC 81 MG tablet      fish oil-omega-3 fatty acids 1000 MG capsule      traMADol 50 MG tablet         TAKE these medications         enoxaparin 40 MG/0.4ML Soln   Commonly known as: LOVENOX   Inject 0.4 mLs (40 mg total) into the skin daily.      hydrochlorothiazide 12.5 MG capsule   Commonly known as: MICROZIDE   Take 12.5 mg by mouth daily.      lisinopril 40 MG tablet   Commonly known as: PRINIVIL,ZESTRIL   Take 40 mg by mouth daily.      methocarbamol 500 MG tablet   Commonly known as: ROBAXIN   Take 1-2 tablets (500-1,000 mg total) by mouth every 6 (six) hours as needed.      mulitivitamin with minerals Tabs   Take 1 tablet by mouth daily.      nabumetone 500 MG tablet   Commonly known as: RELAFEN   Take 500 mg by mouth 2 (two) times daily.      oxyCODONE 5 MG immediate  release tablet   Commonly known as: Oxy IR/ROXICODONE   Take 1-2 tablets (5-10 mg total) by mouth every 3 (three) hours as needed.      oxyCODONE 20 MG 12 hr tablet    Commonly known as: OXYCONTIN   Take 1 tablet (20 mg total) by mouth every 12 (twelve) hours.      Travoprost (BAK Free) 0.004 % Soln ophthalmic solution   Commonly known as: TRAVATAN   Place 1 drop into both eyes at bedtime.            FOLLOW UP VISIT:   Follow-up Information    Follow up with Raymon Mutton, MD. Call on 03/01/2012.   Contact information:   [address] [place] [address] [phone]          DISPOSITION:  Home  Final discharge disposition not confirmed  CONDITION:  Good   Kalim Kissel 02/17/2012, 6:56 AM

## 2012-02-17 NOTE — Progress Notes (Signed)
Physical Therapy Treatment Note  Pt found standing in middle of room with RW upon arrival. Pt instructed not to get up OOB or ambulation by I'ly. Patient agreed.   02/17/12 0836  PT Visit Information  Last PT Received On 02/17/12  Precautions  Precautions Knee  Restrictions  LLE Weight Bearing WBAT  Bed Mobility  Supine to Sit 4: Min assist;HOB flat (no rails)  Supine to Sit Details (indicate cue type and reason) assist for trunk, increased time required, labored   Sit to Supine 5: Supervision;HOB flat  Sit to Supine - Details (indicate cue type and reason) pt able to manage bilat LEs back into bed  Transfers  Sit to Stand 5: Supervision  Sit to Stand Details (indicate cue type and reason) verbal cues for safety  Ambulation/Gait  Ambulation/Gait Assistance 4: Min assist (contact guard)  Ambulation/Gait Assistance Details (indicate cue type and reason) verbal cues to amb at safe pace  Ambulation Distance (Feet) 100 Feet  Assistive device Rolling walker  Gait Pattern Step-to pattern;Decreased step length - left;Decreased stance time - left  Gait velocity more fluid this date  Stairs Yes  Stairs Assistance 4: Min assist  Stairs Assistance Details (indicate cue type and reason) minA for walker management, verbal cues for sequencing  Stair Management Technique Sideways;With walker;No rails  Number of Stairs 4   Wheelchair Mobility  Wheelchair Mobility No  Posture/Postural Control  Posture/Postural Control No significant limitations  Balance  Balance Assessed Yes  Static Standing Balance  Static Standing - Balance Support Left upper extremity supported;During functional activity (pt brushed teeth at sink )  Static Standing - Level of Assistance 5: Stand by assistance  Static Standing - Comment/# of Minutes decreased WBing through L LE  Total Joint Exercises  Quad Sets AROM;Left;10 reps;Seated  Heel Slides AAROM;15 reps;Seated  Knee Flexion AAROM;Left;10 reps;Seated  PT - End  of Session  Equipment Utilized During Treatment Gait belt  Activity Tolerance Patient tolerated treatment well  Patient left in chair;with call bell in reach (OT present)  Nurse Communication Mobility status for ambulation  General  Behavior During Session [facility] for tasks performed  Cognition [facility] for tasks performed  PT - Assessment/Plan  Comments on Treatment Session patient with improved transfer and ambulation tolerance this date. patient remains to have decreased safety awareness and impulsivity requiring 24/7 assist to minimize fall risk. Patient safe to return home with spouse providing 24/7 supervision, [facility] PT and recommended DME.  PT Plan Discharge plan remains appropriate;Frequency remains appropriate  PT Frequency 7X/week  Follow Up Recommendations Home health PT;Supervision/Assistance - 24 hour  Equipment Recommended None recommended by PT  Acute Rehab PT Goals  PT Goal: Supine/Side to Sit - Progress Progressing toward goal  PT Goal: Sit to Stand - Progress Progressing toward goal  PT Goal: Ambulate - Progress Progressing toward goal  PT Goal: Up/Down Stairs - Progress Progressing toward goal  PT Goal: Perform Home Exercise Program - Progress Progressing toward goal     Pain: 4/10 L knee pain  Lewis Shock, PT, DPT Pager #: [phone] Office #: [phone]

## 2012-02-17 NOTE — Progress Notes (Signed)
D/C instructions and scripts given to pt. Pt D/C home with wife.

## 2012-02-17 NOTE — Progress Notes (Signed)
  Georgena Spurling, MD   Altamese Cabal, PA-C [address] [place], [place], [place]  [address]                             [phone]   PROGRESS NOTE  Subjective:  negative for Chest Pain  negative for Shortness of Breath  negative for Nausea/Vomiting   negative for Calf Pain  negative for Bowel Movement   Tolerating Diet: yes         Patient reports pain as 5 on 0-10 scale.    Objective: Vital signs in last 24 hours:   Patient Vitals for the past 24 hrs:  BP Temp Pulse Resp SpO2  02/17/12 0614 128/70 mmHg 97.3 F (36.3 C) 69  19  97 %  02/16/12 2228 130/66 mmHg 97.7 F (36.5 C) 103  20  93 %  02/16/12 1341 119/63 mmHg 99.2 F (37.3 C) 90  18  98 %    @flow {1959:LAST@   Intake/Output from previous day:   03/05 0701 - 03/06 0700 In: 480 [P.O.:480] Out: 300 [Urine:300]   Intake/Output this shift:       Intake/Output      03/05 0701 - 03/06 0700   P.O. 480   Total Intake 480   Urine 300   Total Output 300   Net +180       Urine Occurrence 1 x      LABORATORY DATA: No results found for this basename: WBC:7,HGB:7,HCT:7,PLT:7 in the last 168 hours  Basename 02/17/12 0518 02/16/12 0432  NA 138 142  K 4.1 4.5  CL 102 105  CO2 28 29  BUN 11 11  CREATININE 0.96 0.89  GLUCOSE 127* 115*  CALCIUM 9.2 9.3   Lab Results  Component Value Date   INR 1.03 02/04/2012    Examination:  General appearance: alert, cooperative and no distress Extremities: Homans sign is negative, no sign of DVT  Wound Exam: clean, dry, intact   Drainage:  None: wound tissue dry  Motor Exam: EHL and FHL Intact  Sensory Exam: Deep Peroneal normal  Vascular Exam:    Assessment:    2 Days Post-Op  Procedure(s) (LRB): TOTAL KNEE ARTHROPLASTY (Left)  ADDITIONAL DIAGNOSIS:  Active Problems:  * No active hospital problems. *   Acute Blood Loss Anemia   Plan: Physical Therapy as ordered Weight Bearing as Tolerated (WBAT)  DVT Prophylaxis:  Lovenox  DISCHARGE PLAN:  Home  DISCHARGE NEEDS: HHPT, CPM, Walker and 3-in-1 comode seat         Macon Lesesne 02/17/2012, 6:49 AM

## 2012-02-17 NOTE — Progress Notes (Signed)
Occupational Therapy Treatment Patient Details Name: Timothy Mcdaniel MRN: [id number] DOB: May 08, 1946 Today's Date: 02/17/2012  OT Assessment/Plan OT Assessment/Plan Comments on Treatment Session: Pt. progressing well and anticipates D/C home today. OT Plan: Discharge plan remains appropriate OT Frequency: Min 2X/week Follow Up Recommendations: Home health OT;Supervision - Intermittent Equipment Recommended: None recommended by PT OT Goals Acute Rehab OT Goals Time For Goal Achievement: 7 days ADL Goals Pt Will Perform Lower Body Bathing: with set-up;with supervision;Sit to stand from chair ADL Goal: Lower Body Bathing - Progress: Met Pt Will Perform Lower Body Dressing: with set-up;with supervision;Sit to stand from chair;with adaptive equipment ADL Goal: Lower Body Dressing - Progress: Met Pt Will Transfer to Toilet: with supervision;with DME;3-in-1;Maintaining weight bearing status ADL Goal: Toilet Transfer - Progress: Met Pt Will Perform Tub/Shower Transfer: Tub transfer;with supervision;with DME;Transfer tub bench ADL Goal: Tub/Shower Transfer - Progress: Progressing toward goals  OT Treatment Precautions/Restrictions  Precautions Precautions: Knee Restrictions Weight Bearing Restrictions: Yes LLE Weight Bearing: Weight bearing as tolerated   ADL ADL Grooming: Performed;Wash/dry face;Set up;Supervision/safety Where Assessed - Grooming: Standing at sink Lower Body Bathing: Simulated;Set up Lower Body Bathing Details (indicate cue type and reason): With use of long handled sponge Where Assessed - Lower Body Bathing: Sit to stand from chair Lower Body Dressing: Performed;Minimal assistance Lower Body Dressing Details (indicate cue type and reason): With don/doff sock and educated on use of reach for undergarments and pants Where Assessed - Lower Body Dressing: Sit to stand from chair Toilet Transfer: Performed;Supervision/safety;Set up Toilet Transfer Method:  Ambulating Toilet Transfer Equipment: Other (comment) (recliner) Ambulation Related to ADLs: Pt. provided with close supervision with RW ~75'  ADL Comments: Pt. educated on use of AE to complete LB ADLs Mobility  Bed Mobility Supine to Sit: 4: Min assist;HOB flat (no rails) Supine to Sit Details (indicate cue type and reason): assist for trunk, increased time required, labored  Sit to Supine: 5: Supervision;HOB flat Sit to Supine - Details (indicate cue type and reason): pt able to manage bilat LEs back into bed Transfers Sit to Stand: 5: Supervision Sit to Stand Details (indicate cue type and reason): verbal cues for safety    End of Session OT - End of Session Equipment Utilized During Treatment: Gait belt Activity Tolerance: Patient tolerated treatment well Patient left: in chair;with call bell in reach Nurse Communication: Mobility status for transfers General Behavior During Session: [facility] for tasks performed Cognition: [facility] for tasks performed  Blakelyn Dinges, OTR/L Pager [phone]  02/17/2012, 10:48 AM

## 2012-02-18 DIAGNOSIS — Z96659 Presence of unspecified artificial knee joint: Secondary | ICD-10-CM | POA: Diagnosis not present

## 2012-02-18 DIAGNOSIS — I10 Essential (primary) hypertension: Secondary | ICD-10-CM | POA: Diagnosis not present

## 2012-02-18 DIAGNOSIS — Z471 Aftercare following joint replacement surgery: Secondary | ICD-10-CM | POA: Diagnosis not present

## 2012-02-22 DIAGNOSIS — Z471 Aftercare following joint replacement surgery: Secondary | ICD-10-CM | POA: Diagnosis not present

## 2012-02-22 DIAGNOSIS — I10 Essential (primary) hypertension: Secondary | ICD-10-CM | POA: Diagnosis not present

## 2012-02-22 DIAGNOSIS — Z96659 Presence of unspecified artificial knee joint: Secondary | ICD-10-CM | POA: Diagnosis not present

## 2012-02-24 DIAGNOSIS — I10 Essential (primary) hypertension: Secondary | ICD-10-CM | POA: Diagnosis not present

## 2012-02-24 DIAGNOSIS — Z471 Aftercare following joint replacement surgery: Secondary | ICD-10-CM | POA: Diagnosis not present

## 2012-02-24 DIAGNOSIS — Z96659 Presence of unspecified artificial knee joint: Secondary | ICD-10-CM | POA: Diagnosis not present

## 2012-02-25 DIAGNOSIS — I10 Essential (primary) hypertension: Secondary | ICD-10-CM | POA: Diagnosis not present

## 2012-02-25 DIAGNOSIS — Z471 Aftercare following joint replacement surgery: Secondary | ICD-10-CM | POA: Diagnosis not present

## 2012-02-25 DIAGNOSIS — Z96659 Presence of unspecified artificial knee joint: Secondary | ICD-10-CM | POA: Diagnosis not present

## 2012-02-26 DIAGNOSIS — I10 Essential (primary) hypertension: Secondary | ICD-10-CM | POA: Diagnosis not present

## 2012-02-26 DIAGNOSIS — Z471 Aftercare following joint replacement surgery: Secondary | ICD-10-CM | POA: Diagnosis not present

## 2012-02-26 DIAGNOSIS — Z96659 Presence of unspecified artificial knee joint: Secondary | ICD-10-CM | POA: Diagnosis not present

## 2012-02-29 DIAGNOSIS — Z96659 Presence of unspecified artificial knee joint: Secondary | ICD-10-CM | POA: Diagnosis not present

## 2012-02-29 DIAGNOSIS — I10 Essential (primary) hypertension: Secondary | ICD-10-CM | POA: Diagnosis not present

## 2012-02-29 DIAGNOSIS — Z471 Aftercare following joint replacement surgery: Secondary | ICD-10-CM | POA: Diagnosis not present

## 2012-03-01 DIAGNOSIS — Z96659 Presence of unspecified artificial knee joint: Secondary | ICD-10-CM | POA: Diagnosis not present

## 2012-03-01 DIAGNOSIS — M1710 Unilateral primary osteoarthritis, unspecified knee: Secondary | ICD-10-CM | POA: Diagnosis not present

## 2012-03-01 DIAGNOSIS — M25569 Pain in unspecified knee: Secondary | ICD-10-CM | POA: Diagnosis not present

## 2012-03-01 DIAGNOSIS — Z471 Aftercare following joint replacement surgery: Secondary | ICD-10-CM | POA: Diagnosis not present

## 2012-03-01 DIAGNOSIS — I10 Essential (primary) hypertension: Secondary | ICD-10-CM | POA: Diagnosis not present

## 2012-03-02 DIAGNOSIS — I10 Essential (primary) hypertension: Secondary | ICD-10-CM | POA: Diagnosis not present

## 2012-03-02 DIAGNOSIS — Z471 Aftercare following joint replacement surgery: Secondary | ICD-10-CM | POA: Diagnosis not present

## 2012-03-02 DIAGNOSIS — Z96659 Presence of unspecified artificial knee joint: Secondary | ICD-10-CM | POA: Diagnosis not present

## 2012-03-03 DIAGNOSIS — Z471 Aftercare following joint replacement surgery: Secondary | ICD-10-CM | POA: Diagnosis not present

## 2012-03-03 DIAGNOSIS — Z96659 Presence of unspecified artificial knee joint: Secondary | ICD-10-CM | POA: Diagnosis not present

## 2012-03-03 DIAGNOSIS — I10 Essential (primary) hypertension: Secondary | ICD-10-CM | POA: Diagnosis not present

## 2012-03-04 DIAGNOSIS — I10 Essential (primary) hypertension: Secondary | ICD-10-CM | POA: Diagnosis not present

## 2012-03-04 DIAGNOSIS — Z471 Aftercare following joint replacement surgery: Secondary | ICD-10-CM | POA: Diagnosis not present

## 2012-03-04 DIAGNOSIS — Z96659 Presence of unspecified artificial knee joint: Secondary | ICD-10-CM | POA: Diagnosis not present

## 2012-03-07 DIAGNOSIS — Z471 Aftercare following joint replacement surgery: Secondary | ICD-10-CM | POA: Diagnosis not present

## 2012-03-07 DIAGNOSIS — Z96659 Presence of unspecified artificial knee joint: Secondary | ICD-10-CM | POA: Diagnosis not present

## 2012-03-07 DIAGNOSIS — I10 Essential (primary) hypertension: Secondary | ICD-10-CM | POA: Diagnosis not present

## 2012-03-09 DIAGNOSIS — I10 Essential (primary) hypertension: Secondary | ICD-10-CM | POA: Diagnosis not present

## 2012-03-09 DIAGNOSIS — Z471 Aftercare following joint replacement surgery: Secondary | ICD-10-CM | POA: Diagnosis not present

## 2012-03-09 DIAGNOSIS — Z96659 Presence of unspecified artificial knee joint: Secondary | ICD-10-CM | POA: Diagnosis not present

## 2012-03-11 DIAGNOSIS — Z96659 Presence of unspecified artificial knee joint: Secondary | ICD-10-CM | POA: Diagnosis not present

## 2012-03-11 DIAGNOSIS — Z471 Aftercare following joint replacement surgery: Secondary | ICD-10-CM | POA: Diagnosis not present

## 2012-03-11 DIAGNOSIS — I10 Essential (primary) hypertension: Secondary | ICD-10-CM | POA: Diagnosis not present

## 2012-03-16 DIAGNOSIS — M25569 Pain in unspecified knee: Secondary | ICD-10-CM | POA: Diagnosis not present

## 2012-03-16 DIAGNOSIS — Z96659 Presence of unspecified artificial knee joint: Secondary | ICD-10-CM | POA: Diagnosis not present

## 2012-03-16 DIAGNOSIS — M6281 Muscle weakness (generalized): Secondary | ICD-10-CM | POA: Diagnosis not present

## 2012-03-16 DIAGNOSIS — M1710 Unilateral primary osteoarthritis, unspecified knee: Secondary | ICD-10-CM | POA: Diagnosis not present

## 2012-03-22 DIAGNOSIS — Z96659 Presence of unspecified artificial knee joint: Secondary | ICD-10-CM | POA: Diagnosis not present

## 2012-03-22 DIAGNOSIS — M6281 Muscle weakness (generalized): Secondary | ICD-10-CM | POA: Diagnosis not present

## 2012-03-22 DIAGNOSIS — M25569 Pain in unspecified knee: Secondary | ICD-10-CM | POA: Diagnosis not present

## 2012-03-22 DIAGNOSIS — M1710 Unilateral primary osteoarthritis, unspecified knee: Secondary | ICD-10-CM | POA: Diagnosis not present

## 2012-03-24 DIAGNOSIS — M1710 Unilateral primary osteoarthritis, unspecified knee: Secondary | ICD-10-CM | POA: Diagnosis not present

## 2012-03-24 DIAGNOSIS — Z96659 Presence of unspecified artificial knee joint: Secondary | ICD-10-CM | POA: Diagnosis not present

## 2012-03-24 DIAGNOSIS — M6281 Muscle weakness (generalized): Secondary | ICD-10-CM | POA: Diagnosis not present

## 2012-03-24 DIAGNOSIS — M25569 Pain in unspecified knee: Secondary | ICD-10-CM | POA: Diagnosis not present

## 2012-03-28 DIAGNOSIS — M6281 Muscle weakness (generalized): Secondary | ICD-10-CM | POA: Diagnosis not present

## 2012-03-28 DIAGNOSIS — M1710 Unilateral primary osteoarthritis, unspecified knee: Secondary | ICD-10-CM | POA: Diagnosis not present

## 2012-03-28 DIAGNOSIS — M25569 Pain in unspecified knee: Secondary | ICD-10-CM | POA: Diagnosis not present

## 2012-03-28 DIAGNOSIS — Z96659 Presence of unspecified artificial knee joint: Secondary | ICD-10-CM | POA: Diagnosis not present

## 2012-03-30 DIAGNOSIS — Z96659 Presence of unspecified artificial knee joint: Secondary | ICD-10-CM | POA: Diagnosis not present

## 2012-03-30 DIAGNOSIS — M6281 Muscle weakness (generalized): Secondary | ICD-10-CM | POA: Diagnosis not present

## 2012-03-30 DIAGNOSIS — M25569 Pain in unspecified knee: Secondary | ICD-10-CM | POA: Diagnosis not present

## 2012-03-30 DIAGNOSIS — M1710 Unilateral primary osteoarthritis, unspecified knee: Secondary | ICD-10-CM | POA: Diagnosis not present

## 2012-04-04 DIAGNOSIS — H5310 Unspecified subjective visual disturbances: Secondary | ICD-10-CM | POA: Diagnosis not present

## 2012-04-04 DIAGNOSIS — H2510 Age-related nuclear cataract, unspecified eye: Secondary | ICD-10-CM | POA: Diagnosis not present

## 2012-04-05 DIAGNOSIS — M6281 Muscle weakness (generalized): Secondary | ICD-10-CM | POA: Diagnosis not present

## 2012-04-05 DIAGNOSIS — M25569 Pain in unspecified knee: Secondary | ICD-10-CM | POA: Diagnosis not present

## 2012-04-05 DIAGNOSIS — M1710 Unilateral primary osteoarthritis, unspecified knee: Secondary | ICD-10-CM | POA: Diagnosis not present

## 2012-04-05 DIAGNOSIS — Z96659 Presence of unspecified artificial knee joint: Secondary | ICD-10-CM | POA: Diagnosis not present

## 2012-04-07 DIAGNOSIS — M6281 Muscle weakness (generalized): Secondary | ICD-10-CM | POA: Diagnosis not present

## 2012-04-07 DIAGNOSIS — Z96659 Presence of unspecified artificial knee joint: Secondary | ICD-10-CM | POA: Diagnosis not present

## 2012-04-07 DIAGNOSIS — M1710 Unilateral primary osteoarthritis, unspecified knee: Secondary | ICD-10-CM | POA: Diagnosis not present

## 2012-04-07 DIAGNOSIS — M25569 Pain in unspecified knee: Secondary | ICD-10-CM | POA: Diagnosis not present

## 2012-04-12 DIAGNOSIS — M6281 Muscle weakness (generalized): Secondary | ICD-10-CM | POA: Diagnosis not present

## 2012-04-12 DIAGNOSIS — M25569 Pain in unspecified knee: Secondary | ICD-10-CM | POA: Diagnosis not present

## 2012-04-12 DIAGNOSIS — Z96659 Presence of unspecified artificial knee joint: Secondary | ICD-10-CM | POA: Diagnosis not present

## 2012-04-12 DIAGNOSIS — M1710 Unilateral primary osteoarthritis, unspecified knee: Secondary | ICD-10-CM | POA: Diagnosis not present

## 2012-04-19 DIAGNOSIS — M25569 Pain in unspecified knee: Secondary | ICD-10-CM | POA: Diagnosis not present

## 2012-04-19 DIAGNOSIS — Z96659 Presence of unspecified artificial knee joint: Secondary | ICD-10-CM | POA: Diagnosis not present

## 2012-04-19 DIAGNOSIS — M6281 Muscle weakness (generalized): Secondary | ICD-10-CM | POA: Diagnosis not present

## 2012-04-19 DIAGNOSIS — M1710 Unilateral primary osteoarthritis, unspecified knee: Secondary | ICD-10-CM | POA: Diagnosis not present

## 2012-04-21 DIAGNOSIS — M25569 Pain in unspecified knee: Secondary | ICD-10-CM | POA: Diagnosis not present

## 2012-04-21 DIAGNOSIS — Z96659 Presence of unspecified artificial knee joint: Secondary | ICD-10-CM | POA: Diagnosis not present

## 2012-04-21 DIAGNOSIS — M6281 Muscle weakness (generalized): Secondary | ICD-10-CM | POA: Diagnosis not present

## 2012-04-21 DIAGNOSIS — M1710 Unilateral primary osteoarthritis, unspecified knee: Secondary | ICD-10-CM | POA: Diagnosis not present

## 2012-04-28 DIAGNOSIS — M25569 Pain in unspecified knee: Secondary | ICD-10-CM | POA: Diagnosis not present

## 2012-04-28 DIAGNOSIS — M1710 Unilateral primary osteoarthritis, unspecified knee: Secondary | ICD-10-CM | POA: Diagnosis not present

## 2012-04-28 DIAGNOSIS — Z96659 Presence of unspecified artificial knee joint: Secondary | ICD-10-CM | POA: Diagnosis not present

## 2012-04-28 DIAGNOSIS — M6281 Muscle weakness (generalized): Secondary | ICD-10-CM | POA: Diagnosis not present

## 2012-04-29 DIAGNOSIS — Z96659 Presence of unspecified artificial knee joint: Secondary | ICD-10-CM | POA: Diagnosis not present

## 2012-04-29 DIAGNOSIS — M6281 Muscle weakness (generalized): Secondary | ICD-10-CM | POA: Diagnosis not present

## 2012-04-29 DIAGNOSIS — M25569 Pain in unspecified knee: Secondary | ICD-10-CM | POA: Diagnosis not present

## 2012-04-29 DIAGNOSIS — M1710 Unilateral primary osteoarthritis, unspecified knee: Secondary | ICD-10-CM | POA: Diagnosis not present

## 2012-05-02 DIAGNOSIS — Z96659 Presence of unspecified artificial knee joint: Secondary | ICD-10-CM | POA: Diagnosis not present

## 2012-05-02 DIAGNOSIS — M1710 Unilateral primary osteoarthritis, unspecified knee: Secondary | ICD-10-CM | POA: Diagnosis not present

## 2012-05-02 DIAGNOSIS — M25569 Pain in unspecified knee: Secondary | ICD-10-CM | POA: Diagnosis not present

## 2012-05-02 DIAGNOSIS — M6281 Muscle weakness (generalized): Secondary | ICD-10-CM | POA: Diagnosis not present

## 2012-05-11 DIAGNOSIS — Z96659 Presence of unspecified artificial knee joint: Secondary | ICD-10-CM | POA: Diagnosis not present

## 2012-05-11 DIAGNOSIS — M1710 Unilateral primary osteoarthritis, unspecified knee: Secondary | ICD-10-CM | POA: Diagnosis not present

## 2012-05-11 DIAGNOSIS — M25569 Pain in unspecified knee: Secondary | ICD-10-CM | POA: Diagnosis not present

## 2012-05-11 DIAGNOSIS — M6281 Muscle weakness (generalized): Secondary | ICD-10-CM | POA: Diagnosis not present

## 2012-05-27 DIAGNOSIS — E669 Obesity, unspecified: Secondary | ICD-10-CM | POA: Diagnosis not present

## 2012-05-27 DIAGNOSIS — I10 Essential (primary) hypertension: Secondary | ICD-10-CM | POA: Diagnosis not present

## 2012-05-27 DIAGNOSIS — M159 Polyosteoarthritis, unspecified: Secondary | ICD-10-CM | POA: Diagnosis not present

## 2012-05-27 DIAGNOSIS — Z6839 Body mass index (BMI) 39.0-39.9, adult: Secondary | ICD-10-CM | POA: Diagnosis not present

## 2012-06-27 DIAGNOSIS — H4011X0 Primary open-angle glaucoma, stage unspecified: Secondary | ICD-10-CM | POA: Diagnosis not present

## 2012-06-27 DIAGNOSIS — H25049 Posterior subcapsular polar age-related cataract, unspecified eye: Secondary | ICD-10-CM | POA: Diagnosis not present

## 2012-06-27 DIAGNOSIS — H5200 Hypermetropia, unspecified eye: Secondary | ICD-10-CM | POA: Diagnosis not present

## 2012-06-27 DIAGNOSIS — H2510 Age-related nuclear cataract, unspecified eye: Secondary | ICD-10-CM | POA: Diagnosis not present

## 2012-07-26 DIAGNOSIS — G4733 Obstructive sleep apnea (adult) (pediatric): Secondary | ICD-10-CM | POA: Diagnosis not present

## 2012-07-26 DIAGNOSIS — E669 Obesity, unspecified: Secondary | ICD-10-CM | POA: Diagnosis not present

## 2012-07-26 DIAGNOSIS — G459 Transient cerebral ischemic attack, unspecified: Secondary | ICD-10-CM | POA: Diagnosis not present

## 2012-07-26 DIAGNOSIS — I10 Essential (primary) hypertension: Secondary | ICD-10-CM | POA: Diagnosis not present

## 2012-07-26 DIAGNOSIS — E782 Mixed hyperlipidemia: Secondary | ICD-10-CM | POA: Diagnosis not present

## 2012-07-29 DIAGNOSIS — M1710 Unilateral primary osteoarthritis, unspecified knee: Secondary | ICD-10-CM | POA: Diagnosis not present

## 2012-09-14 DIAGNOSIS — H2510 Age-related nuclear cataract, unspecified eye: Secondary | ICD-10-CM | POA: Diagnosis not present

## 2012-09-14 DIAGNOSIS — H25049 Posterior subcapsular polar age-related cataract, unspecified eye: Secondary | ICD-10-CM | POA: Diagnosis not present

## 2012-10-03 DIAGNOSIS — H2510 Age-related nuclear cataract, unspecified eye: Secondary | ICD-10-CM | POA: Diagnosis not present

## 2012-11-18 DIAGNOSIS — H269 Unspecified cataract: Secondary | ICD-10-CM | POA: Diagnosis not present

## 2012-11-18 DIAGNOSIS — H2510 Age-related nuclear cataract, unspecified eye: Secondary | ICD-10-CM | POA: Diagnosis not present

## 2012-11-18 DIAGNOSIS — H25049 Posterior subcapsular polar age-related cataract, unspecified eye: Secondary | ICD-10-CM | POA: Diagnosis not present

## 2012-12-01 DIAGNOSIS — E669 Obesity, unspecified: Secondary | ICD-10-CM | POA: Diagnosis not present

## 2012-12-01 DIAGNOSIS — G8929 Other chronic pain: Secondary | ICD-10-CM | POA: Diagnosis not present

## 2012-12-01 DIAGNOSIS — Z23 Encounter for immunization: Secondary | ICD-10-CM | POA: Diagnosis not present

## 2012-12-01 DIAGNOSIS — I10 Essential (primary) hypertension: Secondary | ICD-10-CM | POA: Diagnosis not present

## 2012-12-01 DIAGNOSIS — Z6841 Body Mass Index (BMI) 40.0 and over, adult: Secondary | ICD-10-CM | POA: Diagnosis not present

## 2013-01-04 DIAGNOSIS — M159 Polyosteoarthritis, unspecified: Secondary | ICD-10-CM | POA: Diagnosis not present

## 2013-01-04 DIAGNOSIS — Z0000 Encounter for general adult medical examination without abnormal findings: Secondary | ICD-10-CM | POA: Diagnosis not present

## 2013-01-04 DIAGNOSIS — I10 Essential (primary) hypertension: Secondary | ICD-10-CM | POA: Diagnosis not present

## 2013-01-04 DIAGNOSIS — Z6834 Body mass index (BMI) 34.0-34.9, adult: Secondary | ICD-10-CM | POA: Diagnosis not present

## 2013-01-04 DIAGNOSIS — Z713 Dietary counseling and surveillance: Secondary | ICD-10-CM | POA: Diagnosis not present

## 2013-01-12 IMAGING — CR DG CHEST 2V
2 series · 2 of 2 positions shown · non-contrast
Comparison: None.

CLINICAL DATA: Preoperative evaluation for left knee arthroplasty.
Hypertension, shortness of breath, recurrent URI in [DATE]
pack year history of smoking with cessation in 6119

CHEST - 2 VIEW

[view not recorded (1 of 2)]
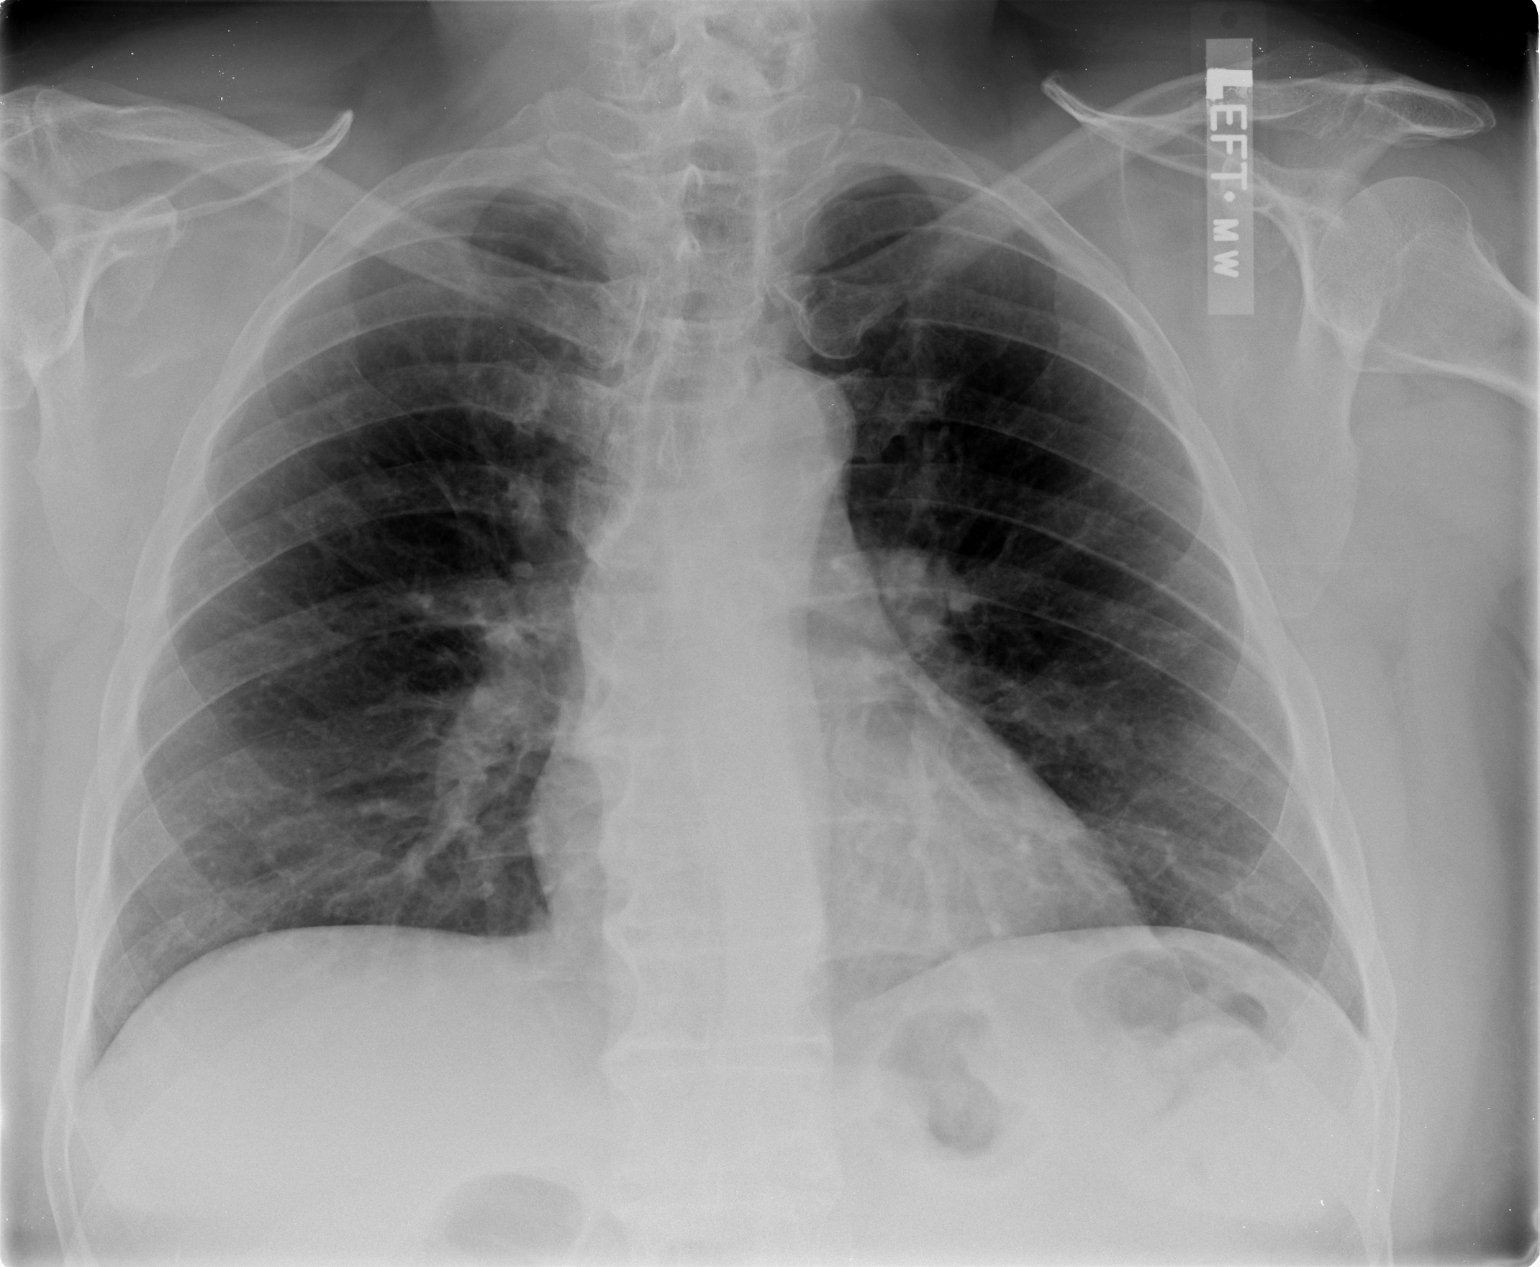

[view not recorded (2 of 2)]
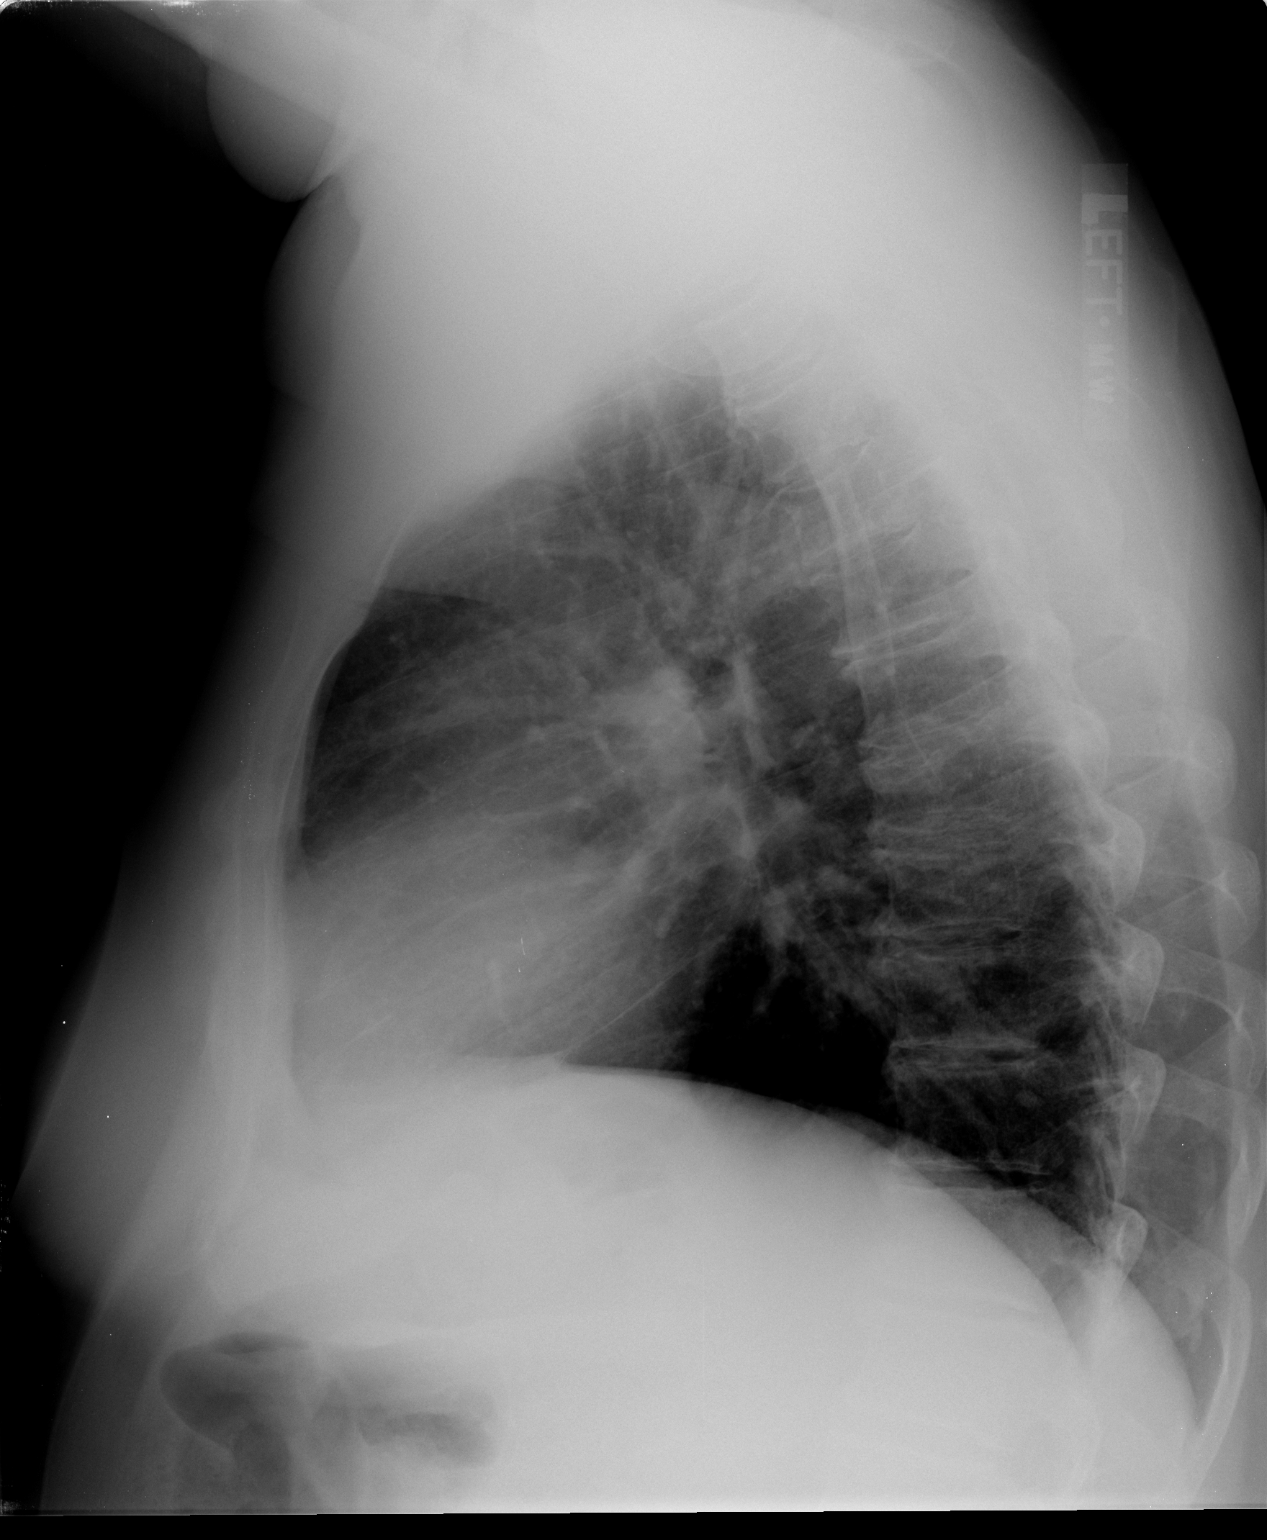

[2 of 2 positions shown; findings below may reference images not displayed]

FINDINGS: Heart and mediastinal contours are within normal limits.
The lung fields are clear with no signs of focal infiltrate or
congestive failure.  No pleural fluid or significant peribronchial
cuffing is seen.

Bony structures demonstrate degenerative osteophytosis of the mid
and lower thoracic spine and are otherwise intact.
IMPRESSION: No worrisome focal or acute cardiopulmonary abnormality seen

## 2013-04-12 DIAGNOSIS — Z125 Encounter for screening for malignant neoplasm of prostate: Secondary | ICD-10-CM | POA: Diagnosis not present

## 2013-04-12 DIAGNOSIS — H409 Unspecified glaucoma: Secondary | ICD-10-CM | POA: Diagnosis not present

## 2013-04-12 DIAGNOSIS — H4011X0 Primary open-angle glaucoma, stage unspecified: Secondary | ICD-10-CM | POA: Diagnosis not present

## 2013-04-12 DIAGNOSIS — Z0000 Encounter for general adult medical examination without abnormal findings: Secondary | ICD-10-CM | POA: Diagnosis not present

## 2013-04-12 DIAGNOSIS — Z79899 Other long term (current) drug therapy: Secondary | ICD-10-CM | POA: Diagnosis not present

## 2013-05-10 ENCOUNTER — Encounter: Payer: Self-pay | Admitting: Internal Medicine

## 2013-07-18 ENCOUNTER — Encounter: Payer: Self-pay | Admitting: *Deleted

## 2013-07-18 DIAGNOSIS — G8929 Other chronic pain: Secondary | ICD-10-CM | POA: Diagnosis not present

## 2013-07-18 DIAGNOSIS — I10 Essential (primary) hypertension: Secondary | ICD-10-CM | POA: Diagnosis not present

## 2013-07-18 DIAGNOSIS — Z6841 Body Mass Index (BMI) 40.0 and over, adult: Secondary | ICD-10-CM | POA: Diagnosis not present

## 2013-07-20 ENCOUNTER — Ambulatory Visit (INDEPENDENT_AMBULATORY_CARE_PROVIDER_SITE_OTHER): Payer: Medicare Other | Admitting: Internal Medicine

## 2013-07-20 ENCOUNTER — Encounter: Payer: Self-pay | Admitting: Internal Medicine

## 2013-07-20 VITALS — BP 138/72 | HR 84 | Ht 71.0 in | Wt 303.1 lb

## 2013-07-20 DIAGNOSIS — I428 Other cardiomyopathies: Secondary | ICD-10-CM

## 2013-07-20 DIAGNOSIS — I10 Essential (primary) hypertension: Secondary | ICD-10-CM | POA: Insufficient documentation

## 2013-07-20 DIAGNOSIS — E785 Hyperlipidemia, unspecified: Secondary | ICD-10-CM | POA: Diagnosis not present

## 2013-07-20 DIAGNOSIS — G4733 Obstructive sleep apnea (adult) (pediatric): Secondary | ICD-10-CM

## 2013-07-20 DIAGNOSIS — G459 Transient cerebral ischemic attack, unspecified: Secondary | ICD-10-CM

## 2013-07-20 DIAGNOSIS — Z9989 Dependence on other enabling machines and devices: Secondary | ICD-10-CM | POA: Insufficient documentation

## 2013-07-20 NOTE — Patient Instructions (Addendum)
Your physician recommends that you return for lab work in: 1-2 weeks.  You will need to be fasting for this blood work.  NMR with lipid  Your physician wants you to follow-up in: 1 year.  You will receive a reminder letter in the mail two months in advance. If you don't receive a letter, please call our office to schedule the follow-up appointment.

## 2013-07-20 NOTE — Progress Notes (Signed)
OFFICE NOTE  Chief Complaint:  Routine followup  Primary Care Physician: Cassell Smiles., MD  HPI:  Timothy Mcdaniel  Is 67 year old gentleman with a history of hypertension and prior stroke, probably a small TIA, and was on Plavix in the past but is not on it currently. 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 Recently he had knee replacement on March 4 and is doing well recovering from that. He also has exogenous obesity and his weight has been going up. Overall, though, denies any chest pain, worsening shortness of breath, palpitations, presyncope, or syncopal symptoms. At his last office visit I started him on low-dose pravastatin do to a LDL cholesterol which was not at goal of less than 70. His total particle number was only mildly elevated at 1101. Since then, he has had about 30 pound weight gain and I expect his cholesterol is much higher.  PMHx:  Past Medical History  Diagnosis Date  . Hypertension   . Stroke     ministroke  . Shortness of breath   . Recurrent upper respiratory infection (URI)     DEC. had flu shot- followed by bad cold, treated /w OTC  . Arthritis     OA- both knees , L shoulder   . Nonischemic cardiomyopathy   . OSA on CPAP   . Exogenous obesity   . OA (osteoarthritis)     Past Surgical History  Procedure Laterality Date  . Mandible fracture surgery      1960's - occupational injury  . 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 Transthoracic echocardiogram  08/03/2011    EF 50%; LV systolic funtion mildly reduced; trace MR, mild TR    FAMHx:  Family History  Problem Relation Age of Onset  . Anesthesia problems Neg Hx   . Hypotension Neg Hx   . Malignant hyperthermia Neg Hx   . Pseudochol deficiency Neg Hx   . Heart attack Mother   . Cancer Father   . Kidney  disease Brother     SOCHx:   reports that he quit smoking about 19 years ago. He does not have any smokeless tobacco history on file. He reports that he does not drink alcohol or use illicit drugs.  ALLERGIES:  No Known Allergies  ROS: A comprehensive review of systems was negative except for: Constitutional: positive for weight gain Respiratory: positive for dyspnea on exertion  HOME MEDS: Current Outpatient Prescriptions  Medication Sig Dispense Refill  . aspirin 81 MG tablet Take 81 mg by mouth daily.      . fish oil-omega-3 fatty acids 1000 MG capsule Take 1,400 mg by mouth daily.       . hydrochlorothiazide (MICROZIDE) 12.5 MG capsule Take 12.5 mg by mouth daily.      Marland Kitchen HYDROcodone-acetaminophen (NORCO) 10-325 MG per tablet Take 2 tablets by mouth at bedtime.      Marland Kitchen lisinopril (PRINIVIL,ZESTRIL) 40 MG tablet Take 40 mg by mouth daily.      . Multiple Vitamin (MULITIVITAMIN WITH MINERALS) TABS Take 1 tablet by mouth daily. Centrum Silver      . pravastatin (PRAVACHOL) 20 MG tablet Take 20 mg by mouth daily.      . Travoprost, BAK Free, (TRAVATAN) 0.004 % SOLN ophthalmic solution Place 1 drop into both eyes at bedtime.  No current facility-administered medications for this visit.    LABS/IMAGING: No results found for this or any previous visit (from the past 48 hour(s)). No results found.  VITALS: BP 138/72  Pulse 84  Ht 5\' 11"  (1.803 m)  Wt 303 lb 1.6 oz (137.485 kg)  BMI 42.29 kg/m2  EXAM: General appearance: alert and no distress Neck: no adenopathy, no carotid bruit, no JVD, supple, symmetrical, trachea midline and thyroid not enlarged, symmetric, no tenderness/mass/nodules Lungs: clear to auscultation bilaterally Heart: regular rate and rhythm, S1, S2 normal, no murmur, click, rub or gallop Abdomen: soft, non-tender; bowel sounds normal; no masses,  no organomegaly Extremities: extremities normal, atraumatic, no cyanosis or edema Pulses: 2+ and  symmetric Skin: Skin color, texture, turgor normal. No rashes or lesions Neurologic: Grossly normal  EKG: Sinus rhythm 84 with an interventricular conduction delay  ASSESSMENT: 1. Nonischemic cardiomyopathy, EF around 50% 2. Hypertension 3. Obesity 4. Obstructive sleep apnea on CPAP 5. History of TIA  PLAN: 1.   Mr. Boomer is doing fairly well except for a 30 pound weight gain. This does not appear to be related to fluid retention. I have asked him to work on exercise and weight loss. He does not report any worsening shortness of breath or other signs of orthopnea, PND or heart failure. He should continue his current medications and start a more rigorous exercise program, specially since he has undergone a successful knee replacement. I will go ahead and check a lipid NMR profile and most likely recommend dietary changes before considering increasing his statin.  Chrystie Nose, MD, [facility] Attending Cardiologist [facility]  HILTY,Kenneth C 07/20/2013, 6:13 PM

## 2013-07-24 DIAGNOSIS — E785 Hyperlipidemia, unspecified: Secondary | ICD-10-CM | POA: Diagnosis not present

## 2013-07-25 LAB — NMR LIPOPROFILE WITH LIPIDS
HDL Size: 8.6 nm — ABNORMAL LOW (ref >=9.2)
HDL-C: 37 mg/dL — ABNORMAL LOW (ref >=40)
LDL (calc): 34 mg/dL (ref <100)
LDL Particle Number: 543 nmol/L (ref <1000)
LDL Size: 20.8 nm (ref >20.5)
LP-IR Score: 79 — ABNORMAL HIGH (ref <=45)
Small LDL Particle Number: 200 nmol/L (ref <=527)

## 2013-07-26 ENCOUNTER — Telehealth: Payer: Self-pay | Admitting: *Deleted

## 2013-07-26 MED ORDER — FENOFIBRATE 48 MG PO TABS: 48.0000 mg | ORAL_TABLET | Freq: Every day | ORAL | Status: DC

## 2013-07-26 NOTE — Telephone Encounter (Signed)
Called patient with NMR results. Instructed per Hilty, MD of medication changes (decreased Pravastatin to 10mg  QD, stop fish oil, begin taking Fenofibrate 48mg  QD). Patient verbalized understanding and medications ordered per MD instructions.

## 2013-07-26 NOTE — Telephone Encounter (Signed)
Message copied by Lindell Spar on Wed Jul 26, 2013  4:08 PM ------      Message from: Chrystie Nose      Created: Tue Jul 25, 2013  4:57 PM       Please let him know the cholesterol looks good. He can decrease his pravastatin to 10 mg daily (1/2 tablet). I would also recommend stopping fish oil.  Triglycerides are slightly high and he could take Fenofibrate 48 mg daily instead, please Rx for him.            Thanks.            -[place]             ------

## 2013-09-25 ENCOUNTER — Encounter: Payer: Self-pay | Admitting: Internal Medicine

## 2014-01-09 DIAGNOSIS — Z0000 Encounter for general adult medical examination without abnormal findings: Secondary | ICD-10-CM | POA: Diagnosis not present

## 2014-01-09 DIAGNOSIS — G4730 Sleep apnea, unspecified: Secondary | ICD-10-CM | POA: Diagnosis not present

## 2014-01-09 DIAGNOSIS — Z79899 Other long term (current) drug therapy: Secondary | ICD-10-CM | POA: Diagnosis not present

## 2014-01-09 DIAGNOSIS — M159 Polyosteoarthritis, unspecified: Secondary | ICD-10-CM | POA: Diagnosis not present

## 2014-01-09 DIAGNOSIS — Z6841 Body Mass Index (BMI) 40.0 and over, adult: Secondary | ICD-10-CM | POA: Diagnosis not present

## 2014-01-09 DIAGNOSIS — I10 Essential (primary) hypertension: Secondary | ICD-10-CM | POA: Diagnosis not present

## 2014-01-10 DIAGNOSIS — H4011X0 Primary open-angle glaucoma, stage unspecified: Secondary | ICD-10-CM | POA: Diagnosis not present

## 2014-01-10 DIAGNOSIS — H2510 Age-related nuclear cataract, unspecified eye: Secondary | ICD-10-CM | POA: Diagnosis not present

## 2014-01-10 DIAGNOSIS — H409 Unspecified glaucoma: Secondary | ICD-10-CM | POA: Diagnosis not present

## 2014-01-18 ENCOUNTER — Telehealth: Payer: Self-pay | Admitting: *Deleted

## 2014-01-18 NOTE — Telephone Encounter (Signed)
Pt called to schedule a colonoscopy please advise [phone]

## 2014-01-22 ENCOUNTER — Other Ambulatory Visit: Payer: Self-pay

## 2014-01-22 DIAGNOSIS — Z1211 Encounter for screening for malignant neoplasm of colon: Secondary | ICD-10-CM

## 2014-01-23 NOTE — Telephone Encounter (Signed)
OK to schedule as long as patient not having any GI issues.

## 2014-01-23 NOTE — Addendum Note (Signed)
Addended by: Mahala Menghini on: 01/23/2014 01:21 PM   Modules accepted: Orders

## 2014-01-23 NOTE — Telephone Encounter (Signed)
Pt had stated that he is not having any problems.

## 2014-01-23 NOTE — Telephone Encounter (Addendum)
Gastroenterology Pre-Procedure Review  Request Date: 01/22/2014 Requesting Physician: Dr. Gala Romney   ( On recall)  Pt has family hx of colon cancer in his father who was diagnosed at age 69 and lived about 6 mo Pt's last colonoscopy was 05/11/2008 by Dr. Gala Romney next was due in 5 years  PATIENT REVIEW QUESTIONS: The patient responded to the following health history questions as indicated:    1. Diabetes Melitis: no 2. Joint replacements in the past 12 months: no 3. Major health problems in the past 3 months: no 4. Has an artificial valve or MVP: no 5. Has a defibrillator: no 6. Has been advised in past to take antibiotics in advance of a procedure like teeth cleaning: no    MEDICATIONS & ALLERGIES:    Patient reports the following regarding taking any blood thinners:   Plavix? no Aspirin? YES Coumadin? no  Patient confirms/reports the following medications:  Current Outpatient Prescriptions  Medication Sig Dispense Refill  . aspirin 81 MG tablet Take 81 mg by mouth daily.      . fenofibrate (TRICOR) 48 MG tablet Take 1 tablet (48 mg total) by mouth daily.  90 tablet  3  . hydrochlorothiazide (MICROZIDE) 12.5 MG capsule Take 12.5 mg by mouth daily.      Marland Kitchen lisinopril (PRINIVIL,ZESTRIL) 40 MG tablet Take 40 mg by mouth daily.      . Multiple Vitamin (MULITIVITAMIN WITH MINERALS) TABS Take 1 tablet by mouth daily. Centrum Silver      . Omega-3 Fatty Acids (FISH OIL) 1000 MG CAPS Take by mouth.      . pravastatin (PRAVACHOL) 20 MG tablet Take 10 mg by mouth daily.       . Travoprost, BAK Free, (TRAVATAN) 0.004 % SOLN ophthalmic solution Place 1 drop into both eyes at bedtime.       No current facility-administered medications for this visit.    Patient confirms/reports the following allergies:  No Known Allergies  No orders of the defined types were placed in this encounter.    AUTHORIZATION INFORMATION Primary Insurance:   ID #:   Group #:  Pre-Cert / Auth required:  Pre-Cert /  Auth #:   Secondary Insurance:   ID #:  Group #:  Pre-Cert / Auth required: Pre-Cert / Auth #:   SCHEDULE INFORMATION: Procedure has been scheduled as follows:  Date: 02/09/2014                 Time: 11:30 AM Location: [facility] Short Stay  This Gastroenterology Pre-Precedure Review Form is being routed to the following provider(s): R. Garfield Cornea, MD

## 2014-01-25 MED ORDER — PEG-KCL-NACL-NASULF-NA ASC-C 100 G PO SOLR: 1.0000 | ORAL | Status: DC

## 2014-01-25 NOTE — Telephone Encounter (Signed)
Rx sent to the pharmacy and instructions mailed to pt.  

## 2014-01-25 NOTE — Addendum Note (Signed)
Addended by: Everardo All on: 01/25/2014 02:55 PM   Modules accepted: Orders

## 2014-01-26 ENCOUNTER — Encounter (HOSPITAL_COMMUNITY): Payer: Self-pay | Admitting: Pharmacy Technician

## 2014-01-29 ENCOUNTER — Telehealth: Payer: Self-pay

## 2014-01-29 MED ORDER — PEG 3350-KCL-NA BICARB-NACL 420 G PO SOLR: 4000.0000 mL | ORAL | Status: DC

## 2014-01-29 NOTE — Telephone Encounter (Signed)
Rx for Movie prep too expensive. New Rx sent in and new instructions mailed to pt and he is aware.

## 2014-02-09 ENCOUNTER — Ambulatory Visit (HOSPITAL_COMMUNITY)
Admission: RE | Admit: 2014-02-09 | Discharge: 2014-02-09 | Disposition: A | Discharge status: Home / Self Care | Payer: Medicare Other | Source: Ambulatory Visit | Attending: Internal Medicine | Admitting: Internal Medicine

## 2014-02-09 ENCOUNTER — Encounter (HOSPITAL_COMMUNITY)
Admission: RE | Disposition: A | Discharge status: Home / Self Care | Payer: Self-pay | Source: Ambulatory Visit | Attending: Internal Medicine

## 2014-02-09 ENCOUNTER — Encounter (HOSPITAL_COMMUNITY): Payer: Self-pay | Admitting: *Deleted

## 2014-02-09 DIAGNOSIS — E7800 Pure hypercholesterolemia, unspecified: Secondary | ICD-10-CM | POA: Diagnosis not present

## 2014-02-09 DIAGNOSIS — Z7982 Long term (current) use of aspirin: Secondary | ICD-10-CM | POA: Diagnosis not present

## 2014-02-09 DIAGNOSIS — I10 Essential (primary) hypertension: Secondary | ICD-10-CM | POA: Diagnosis not present

## 2014-02-09 DIAGNOSIS — Z800 Family history of malignant neoplasm of digestive organs: Secondary | ICD-10-CM

## 2014-02-09 DIAGNOSIS — D126 Benign neoplasm of colon, unspecified: Secondary | ICD-10-CM | POA: Insufficient documentation

## 2014-02-09 DIAGNOSIS — Z79899 Other long term (current) drug therapy: Secondary | ICD-10-CM | POA: Insufficient documentation

## 2014-02-09 DIAGNOSIS — Z1211 Encounter for screening for malignant neoplasm of colon: Secondary | ICD-10-CM

## 2014-02-09 HISTORY — DX: Pure hypercholesterolemia, unspecified: E78.00

## 2014-02-09 HISTORY — PX: COLONOSCOPY: SHX5424

## 2014-02-09 SURGERY — COLONOSCOPY
Anesthesia: Moderate Sedation

## 2014-02-09 MED ORDER — ONDANSETRON HCL 4 MG/2ML IJ SOLN
INTRAMUSCULAR | Status: DC | PRN
  Administered 2014-02-09: 4 mg via INTRAVENOUS

## 2014-02-09 MED ORDER — MIDAZOLAM HCL 5 MG/5ML IJ SOLN
INTRAMUSCULAR | Status: AC
  Filled 2014-02-09: qty 10

## 2014-02-09 MED ORDER — MEPERIDINE HCL 100 MG/ML IJ SOLN
INTRAMUSCULAR | Status: AC
  Filled 2014-02-09: qty 2

## 2014-02-09 MED ORDER — ONDANSETRON HCL 4 MG/2ML IJ SOLN
INTRAMUSCULAR | Status: AC
  Filled 2014-02-09: qty 2

## 2014-02-09 MED ORDER — MIDAZOLAM HCL 5 MG/5ML IJ SOLN
INTRAMUSCULAR | Status: DC | PRN
  Administered 2014-02-09: 1 mg via INTRAVENOUS
  Administered 2014-02-09: 2 mg via INTRAVENOUS
  Administered 2014-02-09: 1 mg via INTRAVENOUS

## 2014-02-09 MED ORDER — SIMETHICONE 40 MG/0.6ML PO SUSP
ORAL | Status: DC | PRN
  Administered 2014-02-09: 11:00:00

## 2014-02-09 MED ORDER — SODIUM CHLORIDE 0.9 % IV SOLN
INTRAVENOUS | Status: DC
  Administered 2014-02-09: 11:00:00 via INTRAVENOUS

## 2014-02-09 MED ORDER — MEPERIDINE HCL 100 MG/ML IJ SOLN
INTRAMUSCULAR | Status: DC | PRN
  Administered 2014-02-09: 50 mg via INTRAVENOUS
  Administered 2014-02-09: 25 mg via INTRAVENOUS

## 2014-02-09 NOTE — Op Note (Signed)
[place], [address]   COLONOSCOPY PROCEDURE REPORT  PATIENT: Timothy, Mcdaniel  MR#:         [id number] BIRTHDATE: 1946/03/15 , 67  yrs. old GENDER: Male ENDOSCOPIST: R.  Garfield Cornea, MD FACP FACG REFERRED BY:  Kerin Perna, M.D. PROCEDURE DATE:  02/09/2014 PROCEDURE:     Colonoscopy with biopsy  INDICATIONS: Colorectal cancer screening examination -- high-risk  INFORMED CONSENT:  The risks, benefits, alternatives and imponderables including but not limited to bleeding, perforation as well as the possibility of a missed lesion have been reviewed.  The potential for biopsy, lesion removal, etc. have also been discussed.  Questions have been answered.  All parties agreeable. Please see the history and physical in the medical record for more information.  MEDICATIONS: Versed 4 mg IV and Demerol 75 mg IV in divided doses. Zofran 4 mg IV  DESCRIPTION OF PROCEDURE:  After a digital rectal exam was performed, the EC-[id number] ([id number])  colonoscope was advanced from the anus through the rectum and colon to the area of the cecum, ileocecal valve and appendiceal orifice.  The cecum was deeply intubated.  These structures were well-seen and photographed for the record.  From the level of the cecum and ileocecal valve, the scope was slowly and cautiously withdrawn.  The mucosal surfaces were carefully surveyed utilizing scope tip deflection to facilitate fold flattening as needed.  The scope was pulled down into the rectum where a thorough examination including retroflexion was performed.    FINDINGS:  Adequate preparation. Normal rectum. Long, tortuous, redundant colon requiring a number of maneuvers including changing of the patient's position and external abdominal pressure to reach the cecum. The patient had (1) diminutive polyp at the hepatic flexure; otherwise, the remainder of the colonic mucosa appeared normal.  THERAPEUTIC /  DIAGNOSTIC MANEUVERS PERFORMED:  The above-mentioned polyp was  cold biopsied/removed.  COMPLICATIONS: None  CECAL WITHDRAWAL TIME:  10 minutes  IMPRESSION:  Colonic polyp-removed as described above  RECOMMENDATIONS: Followup on pathology.   _______________________________ eSigned:  R. Garfield Cornea, MD FACP [facility] 02/09/2014 12:14 PM   CC:

## 2014-02-09 NOTE — Discharge Instructions (Addendum)
Colonoscopy Discharge Instructions  Read the instructions outlined below and refer to this sheet in the next few weeks. These discharge instructions provide you with general information on caring for yourself after you leave the hospital. Your doctor may also give you specific instructions. While your treatment has been planned according to the most current medical practices available, unavoidable complications occasionally occur. If you have any problems or questions after discharge, call Dr. Gala Romney at [phone]. ACTIVITY  You may resume your regular activity, but move at a slower pace for the next 24 hours.   Take frequent rest periods for the next 24 hours.   Walking will help get rid of the air and reduce the bloated feeling in your belly (abdomen).   No driving for 24 hours (because of the medicine (anesthesia) used during the test).    Do not sign any important legal documents or operate any machinery for 24 hours (because of the anesthesia used during the test).  NUTRITION  Drink plenty of fluids.   You may resume your normal diet as instructed by your doctor.   Begin with a light meal and progress to your normal diet. Heavy or fried foods are harder to digest and may make you feel sick to your stomach (nauseated).   Avoid alcoholic beverages for 24 hours or as instructed.  MEDICATIONS  You may resume your normal medications unless your doctor tells you otherwise.  WHAT YOU CAN EXPECT TODAY  Some feelings of bloating in the abdomen.   Passage of more gas than usual.   Spotting of blood in your stool or on the toilet paper.  IF YOU HAD POLYPS REMOVED DURING THE COLONOSCOPY:  No aspirin products for 7 days or as instructed.   No alcohol for 7 days or as instructed.   Eat a soft diet for the next 24 hours.  FINDING OUT THE RESULTS OF YOUR TEST Not all test results are available during your visit. If your test results are not back during the visit, make an appointment  with your caregiver to find out the results. Do not assume everything is normal if you have not heard from your caregiver or the medical facility. It is important for you to follow up on all of your test results.  SEEK IMMEDIATE MEDICAL ATTENTION IF:  You have more than a spotting of blood in your stool.   Your belly is swollen (abdominal distention).   You are nauseated or vomiting.   You have a temperature over 101.   You have abdominal pain or discomfort that is severe or gets worse throughout the day.   Polyp information provided  Further recommendations to follow pending review of pathology report  Colon Polyps Polyps are lumps of extra tissue growing inside the body. Polyps can grow in the large intestine (colon). Most colon polyps are noncancerous (benign). However, some colon polyps can become cancerous over time. Polyps that are larger than a pea may be harmful. To be safe, caregivers remove and test all polyps. CAUSES  Polyps form when mutations in the genes cause your cells to grow and divide even though no more tissue is needed. RISK FACTORS There are a number of risk factors that can increase your chances of getting colon polyps. They include:  Being older than 50 years.  Family history of colon polyps or colon cancer.  Long-term colon diseases, such as colitis or Crohn disease.  Being overweight.  Smoking.  Being inactive.  Drinking too much alcohol. SYMPTOMS  Most small polyps do not cause symptoms. If symptoms are present, they may include:  Blood in the stool. The stool may look dark red or black.  Constipation or diarrhea that lasts longer than 1 week. DIAGNOSIS People often do not know they have polyps until their caregiver finds them during a regular checkup. Your caregiver can use 4 tests to check for polyps:  Digital rectal exam. The caregiver wears gloves and feels inside the rectum. This test would find polyps only in the rectum.  Barium enema.  The caregiver puts a liquid called barium into your rectum before taking X-rays of your colon. Barium makes your colon look white. Polyps are dark, so they are easy to see in the X-ray pictures.  Sigmoidoscopy. A thin, flexible tube (sigmoidoscope) is placed into your rectum. The sigmoidoscope has a light and tiny camera in it. The caregiver uses the sigmoidoscope to look at the last third of your colon.  Colonoscopy. This test is like sigmoidoscopy, but the caregiver looks at the entire colon. This is the most common method for finding and removing polyps. TREATMENT  Any polyps will be removed during a sigmoidoscopy or colonoscopy. The polyps are then tested for cancer. PREVENTION  To help lower your risk of getting more colon polyps:  Eat plenty of fruits and vegetables. Avoid eating fatty foods.  Do not smoke.  Avoid drinking alcohol.  Exercise every day.  Lose weight if recommended by your caregiver.  Eat plenty of calcium and folate. Foods that are rich in calcium include milk, cheese, and broccoli. Foods that are rich in folate include chickpeas, kidney beans, and spinach. HOME CARE INSTRUCTIONS Keep all follow-up appointments as directed by your caregiver. You may need periodic exams to check for polyps. SEEK MEDICAL CARE IF: You notice bleeding during a bowel movement. Document Released: 08/26/2004 Document Revised: 02/22/2012 Document Reviewed: 02/09/2012 [facility] Patient Information 2014 [place].

## 2014-02-09 NOTE — H&P (Signed)
Primary Care Physician:  Glo Herring., MD Primary Gastroenterologist:  Dr. Gala Romney  Pre-Procedure History & Physical: HPI:  ARVLE GRABE is a 68 y.o. male is here for a screening colonoscopy.   Past Medical History  Diagnosis Date  . Hypertension   . Stroke     ministroke  . Shortness of breath   . Recurrent upper respiratory infection (URI)     DEC. had flu shot- followed by bad cold, treated /w OTC  . Arthritis     OA- both knees , L shoulder   . Nonischemic cardiomyopathy   . OSA on CPAP   . Exogenous obesity   . OA (osteoarthritis)   . Hypercholesteremia     Past Surgical History  Procedure Laterality Date  . Mandible fracture surgery      1960's - occupational injury  . Total knee arthroplasty  02/15/2012    Procedure: TOTAL KNEE ARTHROPLASTY;  Surgeon: Rudean Haskell, MD;  Location: [facility];  Service: Orthopedics;  Laterality: Left;  . Transthoracic echocardiogram  08/03/2011    EF 83%; LV systolic funtion mildly reduced; trace MR, mild TR    Prior to Admission medications   Medication Sig Start Date End Date Taking? Authorizing Provider  aspirin 81 MG tablet Take 81 mg by mouth daily.   Yes Historical Provider, MD  fenofibrate (TRICOR) 48 MG tablet Take 1 tablet (48 mg total) by mouth daily. 07/26/13  Yes Pixie Casino, MD  hydrochlorothiazide (MICROZIDE) 12.5 MG capsule Take 12.5 mg by mouth daily.   Yes Historical Provider, MD  lisinopril (PRINIVIL,ZESTRIL) 40 MG tablet Take 40 mg by mouth daily.   Yes Historical Provider, MD  Multiple Vitamins-Minerals (CENTRUM SILVER ADULT 50+ PO) Take 1 tablet by mouth daily.   Yes Historical Provider, MD  Omega-3 Fatty Acids (FISH OIL) 1000 MG CAPS Take 1,000 mg by mouth daily.    Yes Historical Provider, MD  peg 3350 powder (MOVIPREP) 100 G SOLR Take 1 kit (200 g total) by mouth as directed. 01/25/14  Yes Daneil Dolin, MD  polyethylene glycol-electrolytes (TRILYTE) 420 G solution Take 4,000 mLs by mouth as directed.  01/29/14  Yes Daneil Dolin, MD  pravastatin (PRAVACHOL) 20 MG tablet Take 10 mg by mouth daily.  05/22/13  Yes Historical Provider, MD  Travoprost, BAK Free, (TRAVATAN) 0.004 % SOLN ophthalmic solution Place 1 drop into both eyes at bedtime.   Yes Historical Provider, MD    Allergies as of 01/22/2014  . (No Known Allergies)    Family History  Problem Relation Age of Onset  . Anesthesia problems Neg Hx   . Hypotension Neg Hx   . Malignant hyperthermia Neg Hx   . Pseudochol deficiency Neg Hx   . Colon cancer Neg Hx   . Heart attack Mother   . Cancer Father   . Kidney disease Brother     History   Social History  . Marital Status: Married    Spouse Name: N/A    Number of Children: 2  . Years of Education: N/A   Occupational History  . Not on file.   Social History Main Topics  . Smoking status: Former Smoker -- 0.50 packs/day for 28 years    Types: Cigarettes    Quit date: 02/03/1994  . Smokeless tobacco: Not on file  . Alcohol Use: No  . Drug Use: No  . Sexual Activity: Not on file   Other Topics Concern  . Not on file   Social  History Narrative  . No narrative on file    Review of Systems: See HPI, otherwise negative ROS  Physical Exam: BP 125/87  Pulse 80  Temp(Src) 97.4 F (36.3 C) (Oral)  Resp 20  Ht 5' 11"  (1.803 m)  Wt 273 lb (123.832 kg)  BMI 38.09 kg/m2  SpO2 95% General:   Alert,  Well-developed, well-nourished, pleasant and cooperative in NAD Head:  Normocephalic and atraumatic. Eyes:  Sclera clear, no icterus.   Conjunctiva pink. Ears:  Normal auditory acuity. Nose:  No deformity, discharge,  or lesions. Mouth:  No deformity or lesions, dentition normal. Neck:  Supple; no masses or thyromegaly. Lungs:  Clear throughout to auscultation.   No wheezes, crackles, or rhonchi. No acute distress. Heart:  Regular rate and rhythm; no murmurs, clicks, rubs,  or gallops. Abdomen:  Obese. Soft, nontender and nondistended. No masses, hepatosplenomegaly  or hernias noted. Normal bowel sounds, without guarding, and without rebound.   Msk:  Symmetrical without gross deformities. Normal posture. Pulses:  Normal pulses noted. Extremities:  Without clubbing or edema. Neurologic:  Alert and  oriented x4;  grossly normal neurologically.    Impression/Plan: Maggie Schwalbe is now here to undergo a screening colonoscopy. High-risk screening examination.  Risks, benefits, limitations, imponderables and alternatives regarding colonoscopy have been reviewed with the patient. Questions have been answered. All parties agreeable.

## 2014-02-12 ENCOUNTER — Encounter (HOSPITAL_COMMUNITY): Payer: Self-pay | Admitting: Internal Medicine

## 2014-02-13 ENCOUNTER — Encounter: Payer: Self-pay | Admitting: Internal Medicine

## 2014-07-11 DIAGNOSIS — H4011X0 Primary open-angle glaucoma, stage unspecified: Secondary | ICD-10-CM | POA: Diagnosis not present

## 2014-07-11 DIAGNOSIS — H409 Unspecified glaucoma: Secondary | ICD-10-CM | POA: Diagnosis not present

## 2014-07-31 ENCOUNTER — Other Ambulatory Visit: Payer: Self-pay | Admitting: Internal Medicine

## 2014-07-31 NOTE — Telephone Encounter (Signed)
Rx was sent to pharmacy electronically. 

## 2014-09-17 ENCOUNTER — Ambulatory Visit: Payer: Medicare Other | Admitting: Internal Medicine

## 2014-09-18 DIAGNOSIS — Z23 Encounter for immunization: Secondary | ICD-10-CM | POA: Diagnosis not present

## 2014-10-11 ENCOUNTER — Ambulatory Visit (INDEPENDENT_AMBULATORY_CARE_PROVIDER_SITE_OTHER): Payer: Medicare Other | Admitting: Internal Medicine

## 2014-10-11 ENCOUNTER — Encounter: Payer: Self-pay | Admitting: Internal Medicine

## 2014-10-11 VITALS — BP 138/84 | HR 70 | Ht 71.0 in | Wt 289.7 lb

## 2014-10-11 DIAGNOSIS — I428 Other cardiomyopathies: Secondary | ICD-10-CM

## 2014-10-11 DIAGNOSIS — I10 Essential (primary) hypertension: Secondary | ICD-10-CM

## 2014-10-11 DIAGNOSIS — G4733 Obstructive sleep apnea (adult) (pediatric): Secondary | ICD-10-CM

## 2014-10-11 DIAGNOSIS — I429 Cardiomyopathy, unspecified: Secondary | ICD-10-CM

## 2014-10-11 DIAGNOSIS — Z9989 Dependence on other enabling machines and devices: Secondary | ICD-10-CM

## 2014-10-11 DIAGNOSIS — E785 Hyperlipidemia, unspecified: Secondary | ICD-10-CM

## 2014-10-11 NOTE — Progress Notes (Signed)
OFFICE NOTE  Chief Complaint:  Routine followup  Primary Care Physician: Glo Herring., MD  HPI:  Timothy Mcdaniel  Is 68 year old gentleman with a history of hypertension and prior stroke, probably a small TIA, and was on Plavix in the past but is not on it currently. 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 Recently he had knee replacement on March 4 and is doing well recovering from that. He also has exogenous obesity and his weight has been going up. Overall, though, denies any chest pain, worsening shortness of breath, palpitations, presyncope, or syncopal symptoms. At his last office visit I started him on low-dose pravastatin do to a LDL cholesterol which was not at goal of less than 70. His total particle number was only mildly elevated at 1101. Since then, he has had about 30 pound weight gain and I expect his cholesterol is much higher.  Timothy Mcdaniel returns for routine follow-up today. He denies any chest pain or worsening shortly for his of breath. Blood pressure is well-controlled and apparently he is taking full dose hydrochlorothiazide 25 mg instead of 12.5 mg. He has no swelling.  PMHx:  Past Medical History  Diagnosis Date  . Hypertension   . Stroke     ministroke  . Shortness of breath   . Recurrent upper respiratory infection (URI)     DEC. had flu shot- followed by bad cold, treated /w OTC  . Arthritis     OA- both knees , L shoulder   . Nonischemic cardiomyopathy   . OSA on CPAP   . Exogenous obesity   . OA (osteoarthritis)   . Hypercholesteremia     Past Surgical History  Procedure Laterality Date  . Mandible fracture surgery      1960's - occupational injury  . Total knee arthroplasty  02/15/2012    Procedure: TOTAL KNEE ARTHROPLASTY;  Surgeon: Rudean Haskell, MD;  Location: [facility];  Service: Orthopedics;  Laterality: Left;  . Transthoracic echocardiogram  08/03/2011    EF 37%; LV  systolic funtion mildly reduced; trace MR, mild TR  . Colonoscopy N/A 02/09/2014    Procedure: COLONOSCOPY;  Surgeon: Daneil Dolin, MD;  [facility];  Service: Endoscopy;  Laterality: N/A;  11:30 AM    FAMHx:  Family History  Problem Relation Age of Onset  . Anesthesia problems Neg Hx   . Hypotension Neg Hx   . Malignant hyperthermia Neg Hx   . Pseudochol deficiency Neg Hx   . Colon cancer Neg Hx   . Heart attack Mother   . Cancer Father   . Kidney disease Brother     SOCHx:   reports that he quit smoking about 20 years ago. His smoking use included Cigarettes. He has a 14 pack-year smoking history. He does not have any smokeless tobacco history on file. He reports that he does not drink alcohol or use illicit drugs.  ALLERGIES:  No Known Allergies  ROS: A comprehensive review of systems was negative.  HOME MEDS: Current Outpatient Prescriptions  Medication Sig Dispense Refill  . aspirin 81 MG tablet Take 81 mg by mouth daily.      . fenofibrate (TRICOR) 48 MG tablet TAKE ONE TABLET BY MOUTH ONCE DAILY  90 tablet  0  . hydrochlorothiazide (HYDRODIURIL) 25 MG tablet Take 25 mg by mouth daily.      Marland Kitchen KRILL OIL PO Take 350 mg by mouth daily.      Marland Kitchen  lisinopril (PRINIVIL,ZESTRIL) 40 MG tablet Take 40 mg by mouth daily.      . Multiple Vitamins-Minerals (CENTRUM SILVER ADULT 50+ PO) Take 1 tablet by mouth daily.      . Omega-3 Fatty Acids (FISH OIL) 1000 MG CAPS Take 1,000 mg by mouth daily.       . Travoprost, BAK Free, (TRAVATAN) 0.004 % SOLN ophthalmic solution Place 1 drop into both eyes at bedtime.      . traZODone (DESYREL) 50 MG tablet Take 1 tablet by mouth at bedtime as needed.       No current facility-administered medications for this visit.    LABS/IMAGING: No results found for this or any previous visit (from the past 48 hour(s)). No results found.  VITALS: BP 138/84  Pulse 70  Ht 5\' 11"  (1.803 m)  Wt 289 lb 11.2 oz (131.407 kg)  BMI 40.42  kg/m2  EXAM: General appearance: alert and no distress Neck: no adenopathy, no carotid bruit, no JVD, supple, symmetrical, trachea midline and thyroid not enlarged, symmetric, no tenderness/mass/nodules Lungs: clear to auscultation bilaterally Heart: regular rate and rhythm, S1, S2 normal, no murmur, click, rub or gallop Abdomen: soft, non-tender; bowel sounds normal; no masses,  no organomegaly Extremities: extremities normal, atraumatic, no cyanosis or edema Pulses: 2+ and symmetric Skin: Skin color, texture, turgor normal. No rashes or lesions Neurologic: Grossly normal  EKG: Sinus rhythm 70 with an interventricular conduction delay  ASSESSMENT: 1. Nonischemic cardiomyopathy, EF around 50% 2. Hypertension 3. Obesity 4. Obstructive sleep apnea on CPAP 5. History of TIA  PLAN: 1.   Timothy Mcdaniel is doing fairly well. Blood pressure is well-controlled. He has no significant swelling. I decreased his cholesterol medications as last office visit due to very low cholesterol. We will need to recheck that today to make sure that he is being adequately treated. Otherwise he continues to use CPAP and sleeps well at night. He has no worsening shortness of breath. He has had about 30 pound weight loss but had lost about 40 pounds in gained 10 back. He needs to continue to work on weight loss and I would like to get him to a goal weight of less than 250 pounds at his next office visit in 1 year.  Pixie Casino, MD, [facility] Attending Cardiologist [facility] 10/11/2014, 1:03 PM

## 2014-10-11 NOTE — Patient Instructions (Signed)
Your physician recommends that you return for lab work at your convenience - you will need to be fasting  Your physician wants you to follow-up in: 1 year with Dr. Hilty. You will receive a reminder letter in the mail two months in advance. If you don't receive a letter, please call our office to schedule the follow-up appointment.   

## 2014-10-13 LAB — NMR LIPOPROFILE WITH LIPIDS
Cholesterol, Total: 134 mg/dL (ref 100–199)
HDL Particle Number: 30.9 umol/L (ref >=30.5)
HDL SIZE: 8.5 nm — AB (ref >=9.2)
HDL-C: 35 mg/dL — ABNORMAL LOW (ref >39)
LDL CALC: 73 mg/dL (ref 0–99)
LDL Particle Number: 892 nmol/L (ref <1000)
LDL SIZE: 21.4 nm (ref >=20.8)
LP-IR Score: 69 — ABNORMAL HIGH (ref <=45)
Large HDL-P: <1.3 umol/L — ABNORMAL LOW (ref >=4.8)
Large VLDL-P: 5.8 nmol/L — ABNORMAL HIGH (ref <=2.7)
Small LDL Particle Number: 222 nmol/L (ref <=527)
Triglycerides: 131 mg/dL (ref 0–149)
VLDL Size: 51.3 nm — ABNORMAL HIGH (ref <=46.6)

## 2014-10-15 ENCOUNTER — Encounter: Payer: Self-pay | Admitting: *Deleted

## 2014-10-31 ENCOUNTER — Other Ambulatory Visit: Payer: Self-pay | Admitting: Internal Medicine

## 2014-10-31 NOTE — Telephone Encounter (Signed)
Rx was sent to pharmacy electronically. 

## 2015-02-07 DIAGNOSIS — H2512 Age-related nuclear cataract, left eye: Secondary | ICD-10-CM | POA: Diagnosis not present

## 2015-02-07 DIAGNOSIS — H4011X2 Primary open-angle glaucoma, moderate stage: Secondary | ICD-10-CM | POA: Diagnosis not present

## 2015-03-27 DIAGNOSIS — Z125 Encounter for screening for malignant neoplasm of prostate: Secondary | ICD-10-CM | POA: Diagnosis not present

## 2015-03-27 DIAGNOSIS — Z6841 Body Mass Index (BMI) 40.0 and over, adult: Secondary | ICD-10-CM | POA: Diagnosis not present

## 2015-03-27 DIAGNOSIS — E781 Pure hyperglyceridemia: Secondary | ICD-10-CM | POA: Diagnosis not present

## 2015-03-27 DIAGNOSIS — M1991 Primary osteoarthritis, unspecified site: Secondary | ICD-10-CM | POA: Diagnosis not present

## 2015-03-27 DIAGNOSIS — Z0001 Encounter for general adult medical examination with abnormal findings: Secondary | ICD-10-CM | POA: Diagnosis not present

## 2015-03-27 DIAGNOSIS — R945 Abnormal results of liver function studies: Secondary | ICD-10-CM | POA: Diagnosis not present

## 2015-03-27 DIAGNOSIS — I10 Essential (primary) hypertension: Secondary | ICD-10-CM | POA: Diagnosis not present

## 2015-03-27 DIAGNOSIS — G4730 Sleep apnea, unspecified: Secondary | ICD-10-CM | POA: Diagnosis not present

## 2015-03-27 DIAGNOSIS — G8929 Other chronic pain: Secondary | ICD-10-CM | POA: Diagnosis not present

## 2015-03-27 DIAGNOSIS — M159 Polyosteoarthritis, unspecified: Secondary | ICD-10-CM | POA: Diagnosis not present

## 2015-03-27 DIAGNOSIS — Z79899 Other long term (current) drug therapy: Secondary | ICD-10-CM | POA: Diagnosis not present

## 2015-03-27 DIAGNOSIS — E669 Obesity, unspecified: Secondary | ICD-10-CM | POA: Diagnosis not present

## 2015-03-27 DIAGNOSIS — E6609 Other obesity due to excess calories: Secondary | ICD-10-CM | POA: Diagnosis not present

## 2015-08-07 DIAGNOSIS — H4011X2 Primary open-angle glaucoma, moderate stage: Secondary | ICD-10-CM | POA: Diagnosis not present

## 2015-10-11 ENCOUNTER — Ambulatory Visit: Payer: Medicare Other | Admitting: Internal Medicine

## 2015-12-27 ENCOUNTER — Telehealth: Payer: Self-pay | Admitting: Internal Medicine

## 2015-12-27 NOTE — Telephone Encounter (Signed)
Closed encounter °

## 2016-01-15 DIAGNOSIS — Z1389 Encounter for screening for other disorder: Secondary | ICD-10-CM | POA: Diagnosis not present

## 2016-01-15 DIAGNOSIS — G4730 Sleep apnea, unspecified: Secondary | ICD-10-CM | POA: Diagnosis not present

## 2016-01-15 DIAGNOSIS — I429 Cardiomyopathy, unspecified: Secondary | ICD-10-CM | POA: Diagnosis not present

## 2016-01-15 DIAGNOSIS — Z6841 Body Mass Index (BMI) 40.0 and over, adult: Secondary | ICD-10-CM | POA: Diagnosis not present

## 2016-01-16 DIAGNOSIS — H401112 Primary open-angle glaucoma, right eye, moderate stage: Secondary | ICD-10-CM | POA: Diagnosis not present

## 2016-01-16 DIAGNOSIS — H401122 Primary open-angle glaucoma, left eye, moderate stage: Secondary | ICD-10-CM | POA: Diagnosis not present

## 2016-01-17 ENCOUNTER — Ambulatory Visit: Payer: Medicare Other | Admitting: Internal Medicine

## 2016-02-11 ENCOUNTER — Encounter: Payer: Self-pay | Admitting: Internal Medicine

## 2016-02-11 ENCOUNTER — Ambulatory Visit (INDEPENDENT_AMBULATORY_CARE_PROVIDER_SITE_OTHER): Payer: Medicare Other | Admitting: Internal Medicine

## 2016-02-11 VITALS — BP 120/80 | HR 74 | Ht 71.0 in | Wt 309.0 lb

## 2016-02-11 DIAGNOSIS — I429 Cardiomyopathy, unspecified: Secondary | ICD-10-CM

## 2016-02-11 DIAGNOSIS — R0600 Dyspnea, unspecified: Secondary | ICD-10-CM | POA: Diagnosis not present

## 2016-02-11 DIAGNOSIS — R079 Chest pain, unspecified: Secondary | ICD-10-CM

## 2016-02-11 DIAGNOSIS — R0609 Other forms of dyspnea: Secondary | ICD-10-CM | POA: Diagnosis not present

## 2016-02-11 DIAGNOSIS — I428 Other cardiomyopathies: Secondary | ICD-10-CM

## 2016-02-11 DIAGNOSIS — I10 Essential (primary) hypertension: Secondary | ICD-10-CM

## 2016-02-11 DIAGNOSIS — G4733 Obstructive sleep apnea (adult) (pediatric): Secondary | ICD-10-CM

## 2016-02-11 DIAGNOSIS — Z9989 Dependence on other enabling machines and devices: Secondary | ICD-10-CM

## 2016-02-11 NOTE — Progress Notes (Signed)
OFFICE NOTE  Chief Complaint:  Progressive DOE  Primary Care Physician: Glo Herring., MD  HPI:  Timothy Mcdaniel  Is 70 year old gentleman with a history of hypertension and prior stroke, probably a small TIA, and was on Plavix in the past but is not on it currently. 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 Recently he had knee replacement on March 4 and is doing well recovering from that. He also has exogenous obesity and his weight has been going up. Overall, though, denies any chest pain, worsening shortness of breath, palpitations, presyncope, or syncopal symptoms. At his last office visit I started him on low-dose pravastatin do to a LDL cholesterol which was not at goal of less than 70. His total particle number was only mildly elevated at 1101. Since then, he has had about 30 pound weight gain and I expect his cholesterol is much higher.  [name] returns for routine follow-up today. He denies any chest pain or worsening shortly for his of breath. Blood pressure is well-controlled and apparently he is taking full dose hydrochlorothiazide 25 mg instead of 12.5 mg. He has no swelling.  [name] returns today for follow-up. He is reporting new progressive shortness of breath. Unfortunately he's had about 30 pound weight gain since his last office visit in October 2015. He says he religiously uses his CPAP although his devices over 2 years old and does not auto titrate. He reports some mild chest discomfort at rest while laying in a recliner. He notes significant shortness of breath with exertion. Particular a climbing stairs and walking some distances makes him very short of breath. He does have a history of nonischemic cardio myopathy with EF of 50%. He's never had heart catheterization but did have a negative stress test in the past, prior to me assuming his care. He denies any orthopnea, but does wear CPAP at  night. He reports some mild lower extremity swelling.  PMHx:  Past Medical History  Diagnosis Date  . Hypertension   . Stroke ([place])     ministroke  . Shortness of breath   . Recurrent upper respiratory infection (URI)     DEC. had flu shot- followed by bad cold, treated /w OTC  . Arthritis     OA- both knees , L shoulder   . Nonischemic cardiomyopathy ([place])   . OSA on CPAP   . Exogenous obesity   . OA (osteoarthritis)   . Hypercholesteremia     Past Surgical History  Procedure Laterality Date  . Mandible fracture surgery      1960's - occupational injury  . Total knee arthroplasty  02/15/2012    Procedure: TOTAL KNEE ARTHROPLASTY;  Surgeon: Rudean Haskell, MD;  Location: [facility];  Service: Orthopedics;  Laterality: Left;  . Transthoracic echocardiogram  08/03/2011    EF [id number]; LV systolic funtion mildly reduced; trace MR, mild TR  . Colonoscopy N/A 02/09/2014    Procedure: COLONOSCOPY;  Surgeon: Daneil Dolin, MD;  [facility];  Service: Endoscopy;  Laterality: N/A;  11:30 AM    FAMHx:  Family History  Problem Relation Age of Onset  . Anesthesia problems Neg Hx   . Hypotension Neg Hx   . Malignant hyperthermia Neg Hx   . Pseudochol deficiency Neg Hx   . Colon cancer Neg Hx   . Heart attack Mother   . Cancer Father   . Kidney disease Brother  SOCHx:   reports that he quit smoking about 22 years ago. His smoking use included Cigarettes. He has a 14 pack-year smoking history. He does not have any smokeless tobacco history on file. He reports that he does not drink alcohol or use illicit drugs.  ALLERGIES:  No Known Allergies  ROS: Pertinent items noted in HPI and remainder of comprehensive ROS otherwise negative.  HOME MEDS: Current Outpatient Prescriptions  Medication Sig Dispense Refill  . aspirin 81 MG tablet Take 81 mg by mouth daily.    . brimonidine (ALPHAGAN) 0.15 % ophthalmic solution Place 1 drop into both eyes every morning.    . fenofibrate  (TRICOR) 48 MG tablet TAKE ONE TABLET BY MOUTH ONCE DAILY 90 tablet 3  . Flaxseed, Linseed, (FLAXSEED OIL MAX STR) 1300 MG CAPS Take 1 capsule by mouth 3 (three) times daily.    . hydrochlorothiazide (HYDRODIURIL) 25 MG tablet Take 25 mg by mouth daily.    Marland Kitchen KRILL OIL OMEGA-3 PO Take 350 mg by mouth daily.    Marland Kitchen KRILL OIL PO Take 350 mg by mouth daily.    Marland Kitchen latanoprost (XALATAN) 0.005 % ophthalmic solution Place 1 drop into both eyes at bedtime.    Marland Kitchen lisinopril (PRINIVIL,ZESTRIL) 40 MG tablet Take 40 mg by mouth daily.    . Multiple Vitamins-Minerals (CENTRUM SILVER ADULT 50+ PO) Take 1 tablet by mouth daily.     No current facility-administered medications for this visit.    LABS/IMAGING: No results found for this or any previous visit (from the past 48 hour(s)). No results found.  VITALS: BP 120/80 mmHg  Pulse 74  Ht 5\' 11"  (1.803 m)  Wt 309 lb (140.161 kg)  BMI 43.12 kg/m2  EXAM: General appearance: alert, no distress and morbidly obese Neck: no carotid bruit and no JVD Lungs: clear to auscultation bilaterally Heart: regular rate and rhythm, S1, S2 normal, no murmur, click, rub or gallop Abdomen: soft, non-tender; bowel sounds normal; no masses,  no organomegaly Extremities: edema trace bilateral Pulses: 2+ and symmetric Skin: Skin color, texture, turgor normal. No rashes or lesions Neurologic: Grossly normal  EKG: Normal sinus rhythm with sinus arrhythmia at 74, LVH with repolarization abnormality  ASSESSMENT: 1. Aggressive dyspnea on exertion 2. Nonischemic cardiomyopathy, EF around 50% 3. Hypertension 4. Obesity 5. Obstructive sleep apnea on CPAP - with persistent fatigue and poor sleep 6. History of TIA  PLAN: 1.   [name] has had 30 pound weight gain which may be contributing to his shortness of breath. He does have a history of cardiomyopathy with EF 50% in the distant past. Like to repeat an echo make sure he has not had any worsening systolic function.  Also like to get a stress test based on his new symptoms, chest discomfort and progressive dyspnea on exertion. He will need a 2 day study based on his weight. Finally he reports poor nonrestorative sleep, despite wearing CPAP at night. His machine is over 61 years old. The settings may not be appropriate given his weight gain. I like for him to have a repeat split-night sleep study and hopefully he can get new equipment and have the settings readdressed by a sleep medicine specialist.  Follow-up with me in a few weeks to discuss these results.  Pixie Casino, MD, [facility] Attending Cardiologist [facility] 02/11/2016, 9:12 AM

## 2016-02-11 NOTE — Patient Instructions (Signed)
Your physician has requested that you have a 2 day lexiscan myoview. For further information please visit [email or web address]. Please follow instruction sheet, as given.  Your physician has requested that you have an echocardiogram @ [facility]. Echocardiography is a painless test that uses sound waves to create images of your heart. It provides your doctor with information about the size and shape of your heart and how well your heart's chambers and valves are working. This procedure takes approximately one hour. There are no restrictions for this procedure.  Your physician has recommended that you have a sleep study @ [facility]. This test records several body functions during sleep, including: brain activity, eye movement, oxygen and carbon dioxide blood levels, heart rate and rhythm, breathing rate and rhythm, the flow of air through your mouth and nose, snoring, body muscle movements, and chest and belly movement.  Your physician recommends that you schedule a follow-up appointment after your tests.

## 2016-02-12 ENCOUNTER — Ambulatory Visit (HOSPITAL_BASED_OUTPATIENT_CLINIC_OR_DEPARTMENT_OTHER): Payer: Medicare Other | Attending: Internal Medicine | Admitting: *Deleted

## 2016-02-12 DIAGNOSIS — Z7982 Long term (current) use of aspirin: Secondary | ICD-10-CM | POA: Diagnosis not present

## 2016-02-12 DIAGNOSIS — G4733 Obstructive sleep apnea (adult) (pediatric): Secondary | ICD-10-CM | POA: Diagnosis not present

## 2016-02-12 DIAGNOSIS — Z79899 Other long term (current) drug therapy: Secondary | ICD-10-CM | POA: Diagnosis not present

## 2016-02-12 DIAGNOSIS — R0683 Snoring: Secondary | ICD-10-CM | POA: Diagnosis not present

## 2016-02-27 ENCOUNTER — Telehealth (HOSPITAL_COMMUNITY): Payer: Self-pay

## 2016-02-27 NOTE — Telephone Encounter (Signed)
Encounter complete. 

## 2016-03-02 ENCOUNTER — Telehealth: Payer: Self-pay | Admitting: *Deleted

## 2016-03-02 ENCOUNTER — Other Ambulatory Visit: Payer: Self-pay | Admitting: *Deleted

## 2016-03-02 DIAGNOSIS — Z9989 Dependence on other enabling machines and devices: Principal | ICD-10-CM

## 2016-03-02 DIAGNOSIS — G4733 Obstructive sleep apnea (adult) (pediatric): Secondary | ICD-10-CM

## 2016-03-02 NOTE — Telephone Encounter (Signed)
-----   Message from Troy Sine, MD sent at 03/02/2016 11:15 AM EDT ----- Mariann Laster, please contact his DME for CPAP and f/u sleep clinic

## 2016-03-02 NOTE — Telephone Encounter (Signed)
Patient informed of sleep study results and recommendations. [facility]AP order placed [facility] to [facility] up to get a new machine.

## 2016-03-02 NOTE — Progress Notes (Signed)
Patient notified order will be [facility] to [facility] for new [facility] machine.

## 2016-03-02 NOTE — Sleep Study (Signed)
Patient Name: Timothy Mcdaniel, Timothy Mcdaniel Date: 02/12/2016 Gender: Male D.O.B: 03/20/1946 Age (years): 69 Referring Provider: Nadean Corwin Hilty Height (inches): 72 Interpreting Physician: Shelva Majestic MD, ABSM Weight (lbs): 309 RPSGT: Gerhard Perches BMI: 42 MRN: [id number] Neck Size: 18.50  CLINICAL INFORMATION Sleep Study Type: Split Night CPAP Indication for sleep study: OSA, Snoring Epworth Sleepiness Score: 10  SLEEP STUDY TECHNIQUE As per the AASM Manual for the Scoring of Sleep and Associated Events v2.3 (April 2016) with a hypopnea requiring 4% desaturations. The channels recorded and monitored were frontal, central and occipital EEG, electrooculogram (EOG), submentalis EMG (chin), nasal and oral airflow, thoracic and abdominal wall motion, anterior tibialis EMG, snore microphone, electrocardiogram, and pulse oximetry. Continuous positive airway pressure (CPAP) was initiated when the patient met split night criteria and was titrated according to treat sleep-disordered breathing.  MEDICATIONS  aspirin 81 MG tablet 81 mg, Daily     brimonidine (ALPHAGAN) 0.15 % ophthalmic solution 1 drop, BH-each morning     fenofibrate (TRICOR) 48 MG tablet      Flaxseed, Linseed, (FLAXSEED OIL MAX STR) 1300 MG CAPS 1 capsule, 3 times daily     hydrochlorothiazide (HYDRODIURIL) 25 MG tablet 25 mg, Daily     KRILL OIL OMEGA-3 PO 350 mg, Daily     KRILL OIL PO 350 mg, Daily     latanoprost (XALATAN) 0.005 % ophthalmic solution 1 drop, Daily at bedtime     lisinopril (PRINIVIL,ZESTRIL) 40 MG tablet 40 mg, Daily     Multiple Vitamins-Minerals (CENTRUM SILVER    Medications administered by patient during sleep study : No sleep medicine administered.  RESPIRATORY PARAMETERS Diagnostic Total AHI (/hr): 16.4 RDI (/hr): 19.9 OA Index (/hr): 1.6 CA Index (/hr): 0.0 REM AHI (/hr): 72.0 NREM AHI (/hr): 15.5 Supine AHI (/hr): 38.8 Non-supine AHI (/hr): 13.63 Min O2 Sat (%): 84.00 Mean O2  (%): 91.17 Time below 88% (min): 6.4   Titration Optimal Pressure (cm): 17 AHI at Optimal Pressure (/hr): 0.0 Min O2 at Optimal Pressure (%): 92.0 Supine % at Optimal (%): 100 Sleep % at Optimal (%): 62    SLEEP ARCHITECTURE The recording time for the entire night was 432.0 minutes. During a baseline period of 254.3 minutes, the patient slept for 153.5 minutes in REM and nonREM, yielding a sleep efficiency of 60.4%. Sleep onset after lights out was 40.7 minutes with a REM latency of 105.0 minutes. The patient spent 22.15% of the night in stage N1 sleep, 76.22% in stage N2 sleep, 0.00% in stage N3 and 1.63% in REM. During the titration period of 169.2 minutes, the patient slept for 112.5 minutes in REM and nonREM, yielding a sleep efficiency of 66.5%. Sleep onset after CPAP initiation was 29.4 minutes with a REM latency of 26.0 minutes. The patient spent 12.89% of the night in stage N1 sleep, 53.78% in stage N2 sleep, 0.00% in stage N3 and 33.33% in REM.  CARDIAC DATA The 2 lead EKG demonstrated sinus rhythm. The mean heart rate was 67.35 beats per minute. Other EKG findings include: None.  LEG MOVEMENT DATA The total Periodic Limb Movements of Sleep (PLMS) were 89. The PLMS index was 20.08 .  IMPRESSIONS - Moderate obstructive sleep apnea overall during the diagnostic portion of the study (AHI = 16.4/h), but severe sleep apnea during REM sleep (AHI 72/h).   The patient was titrated with CPAP up to 17 cm of water with an AHI 0 and minimum O2 saturation of 92%. - No significant central sleep  apnea occurred during the diagnostic portion of the study (CAI = 0.0/hour). - Mild oxygen desaturation during the diagnostic portion of the study to a nadir of 84.00%, with time below 88% at 6.4 minutes. - Reduced sleep efficiency with limited REM sleep during the diagnostic portion.  - Moderate snoring volume during the diagnostic portion of the study with resolution of snoring with CPAP. - No cardiac  abnormalities were noted during this study. - Mild periodic limb movements of sleep occurred during the study.  DIAGNOSIS - Obstructive Sleep Apnea (327.23 [G47.33 ICD-10])  RECOMMENDATIONS - Recommend an initial trial of CPAP therapy at 17 cm H2O pressure with a Large size Resmed Full Face Mask AirFit F20 mask and heated humidification. - Avoid alcohol, sedatives and other CNS depressants that may worsen sleep apnea and disrupt normal sleep architecture. - If patient is symptomatic with restless legs on CPAP therapy, consider a trial of medical therapy with a PLMS index of 20.08. - Sleep hygiene should be reviewed to assess factors that may improve sleep quality. - Weight management (BMI 42) and regular exercise should be initiated. - Recommend a download in 30 days and sleep clinic evaluation.   Troy Sine, MD, [place], American Board of Sleep Medicine  ELECTRONICALLY SIGNED ON:  03/02/2016, 10:59 AM [place] PH: [phone]   FX: [phone] [place]

## 2016-03-03 ENCOUNTER — Ambulatory Visit (HOSPITAL_COMMUNITY)
Admission: RE | Admit: 2016-03-03 | Discharge: 2016-03-03 | Disposition: A | Discharge status: Home / Self Care | Payer: Medicare Other | Source: Ambulatory Visit | Attending: Cardiovascular Disease | Admitting: Cardiovascular Disease

## 2016-03-03 DIAGNOSIS — Z6841 Body Mass Index (BMI) 40.0 and over, adult: Secondary | ICD-10-CM | POA: Diagnosis not present

## 2016-03-03 DIAGNOSIS — I429 Cardiomyopathy, unspecified: Secondary | ICD-10-CM | POA: Diagnosis not present

## 2016-03-03 DIAGNOSIS — R5383 Other fatigue: Secondary | ICD-10-CM | POA: Insufficient documentation

## 2016-03-03 DIAGNOSIS — Z87891 Personal history of nicotine dependence: Secondary | ICD-10-CM | POA: Diagnosis not present

## 2016-03-03 DIAGNOSIS — I428 Other cardiomyopathies: Secondary | ICD-10-CM

## 2016-03-03 DIAGNOSIS — R0609 Other forms of dyspnea: Secondary | ICD-10-CM | POA: Insufficient documentation

## 2016-03-03 DIAGNOSIS — G4733 Obstructive sleep apnea (adult) (pediatric): Secondary | ICD-10-CM | POA: Insufficient documentation

## 2016-03-03 DIAGNOSIS — R0600 Dyspnea, unspecified: Secondary | ICD-10-CM | POA: Diagnosis not present

## 2016-03-03 DIAGNOSIS — Z8249 Family history of ischemic heart disease and other diseases of the circulatory system: Secondary | ICD-10-CM | POA: Insufficient documentation

## 2016-03-03 DIAGNOSIS — E669 Obesity, unspecified: Secondary | ICD-10-CM | POA: Diagnosis not present

## 2016-03-03 DIAGNOSIS — R9439 Abnormal result of other cardiovascular function study: Secondary | ICD-10-CM | POA: Diagnosis not present

## 2016-03-03 DIAGNOSIS — I119 Hypertensive heart disease without heart failure: Secondary | ICD-10-CM | POA: Insufficient documentation

## 2016-03-03 DIAGNOSIS — R079 Chest pain, unspecified: Secondary | ICD-10-CM | POA: Insufficient documentation

## 2016-03-03 MED ORDER — REGADENOSON 0.4 MG/5ML IV SOLN
0.4000 mg | Freq: Once | INTRAVENOUS | Status: AC
  Administered 2016-03-03: 0.4 mg via INTRAVENOUS

## 2016-03-03 MED ORDER — TECHNETIUM TC 99M SESTAMIBI GENERIC - CARDIOLITE
29.9000 | Freq: Once | INTRAVENOUS | Status: AC | PRN
Start: 2016-03-03 — End: 2016-03-03
  Administered 2016-03-03: 29.9 via INTRAVENOUS

## 2016-03-04 ENCOUNTER — Ambulatory Visit (HOSPITAL_COMMUNITY)
Admission: RE | Admit: 2016-03-04 | Discharge: 2016-03-04 | Disposition: A | Discharge status: Home / Self Care | Payer: Medicare Other | Source: Ambulatory Visit | Attending: Cardiovascular Disease | Admitting: Cardiovascular Disease

## 2016-03-04 ENCOUNTER — Ambulatory Visit (HOSPITAL_BASED_OUTPATIENT_CLINIC_OR_DEPARTMENT_OTHER)
Admission: RE | Admit: 2016-03-04 | Discharge: 2016-03-04 | Disposition: A | Discharge status: Home / Self Care | Payer: Medicare Other | Source: Ambulatory Visit | Attending: Cardiovascular Disease | Admitting: Cardiovascular Disease

## 2016-03-04 DIAGNOSIS — I119 Hypertensive heart disease without heart failure: Secondary | ICD-10-CM | POA: Diagnosis not present

## 2016-03-04 DIAGNOSIS — R079 Chest pain, unspecified: Secondary | ICD-10-CM | POA: Diagnosis not present

## 2016-03-04 DIAGNOSIS — E669 Obesity, unspecified: Secondary | ICD-10-CM | POA: Diagnosis not present

## 2016-03-04 DIAGNOSIS — I071 Rheumatic tricuspid insufficiency: Secondary | ICD-10-CM | POA: Diagnosis not present

## 2016-03-04 DIAGNOSIS — I429 Cardiomyopathy, unspecified: Secondary | ICD-10-CM | POA: Insufficient documentation

## 2016-03-04 DIAGNOSIS — E785 Hyperlipidemia, unspecified: Secondary | ICD-10-CM | POA: Diagnosis not present

## 2016-03-04 DIAGNOSIS — Z6841 Body Mass Index (BMI) 40.0 and over, adult: Secondary | ICD-10-CM | POA: Insufficient documentation

## 2016-03-04 DIAGNOSIS — Z87891 Personal history of nicotine dependence: Secondary | ICD-10-CM | POA: Insufficient documentation

## 2016-03-04 DIAGNOSIS — I428 Other cardiomyopathies: Secondary | ICD-10-CM

## 2016-03-04 DIAGNOSIS — R0600 Dyspnea, unspecified: Secondary | ICD-10-CM | POA: Diagnosis not present

## 2016-03-04 DIAGNOSIS — J449 Chronic obstructive pulmonary disease, unspecified: Secondary | ICD-10-CM | POA: Insufficient documentation

## 2016-03-04 LAB — MYOCARDIAL PERFUSION IMAGING
CHL CUP NUCLEAR SRS: 5
CHL CUP NUCLEAR SSS: 8
CHL CUP RESTING HR STRESS: 74 {beats}/min
LV sys vol: 41 mL
LVDIAVOL: 100 mL (ref 62–150)
Peak HR: 88 {beats}/min
SDS: 3
TID: 0.78

## 2016-03-04 MED ORDER — TECHNETIUM TC 99M SESTAMIBI GENERIC - CARDIOLITE
30.2000 | Freq: Once | INTRAVENOUS | Status: AC | PRN
Start: 2016-03-04 — End: 2016-03-04
  Administered 2016-03-04: 30.2 via INTRAVENOUS

## 2016-03-09 ENCOUNTER — Telehealth: Payer: Self-pay | Admitting: Internal Medicine

## 2016-03-09 ENCOUNTER — Ambulatory Visit (INDEPENDENT_AMBULATORY_CARE_PROVIDER_SITE_OTHER): Payer: Medicare Other | Admitting: Internal Medicine

## 2016-03-09 ENCOUNTER — Encounter: Payer: Self-pay | Admitting: Internal Medicine

## 2016-03-09 VITALS — BP 122/80 | HR 85 | Ht 71.0 in | Wt 304.8 lb

## 2016-03-09 DIAGNOSIS — G4733 Obstructive sleep apnea (adult) (pediatric): Secondary | ICD-10-CM | POA: Diagnosis not present

## 2016-03-09 DIAGNOSIS — I10 Essential (primary) hypertension: Secondary | ICD-10-CM | POA: Diagnosis not present

## 2016-03-09 DIAGNOSIS — I428 Other cardiomyopathies: Secondary | ICD-10-CM

## 2016-03-09 DIAGNOSIS — I429 Cardiomyopathy, unspecified: Secondary | ICD-10-CM

## 2016-03-09 DIAGNOSIS — Z9989 Dependence on other enabling machines and devices: Secondary | ICD-10-CM

## 2016-03-09 DIAGNOSIS — R0609 Other forms of dyspnea: Secondary | ICD-10-CM

## 2016-03-09 NOTE — Telephone Encounter (Signed)
Timothy Mcdaniel is calling about a cpap machine .Marland Kitchen Please call

## 2016-03-09 NOTE — Telephone Encounter (Signed)
Returned call to patient. He states he called [facility] to find out about his CPAP & supplies.  He was informed that [facility] does not have any orders on file. Requesting order to be resent.

## 2016-03-09 NOTE — Patient Instructions (Signed)
NO CHANGES IN CURRENT MEDIATIONS   Your physician wants you to follow-up in [place] DR HILTY.  You will receive a reminder letter in the mail two months in advance. If you don't receive a letter, please call our office to schedule the follow-up appointment.   If you need a refill on your cardiac medications before your next appointment, please call your pharmacy.

## 2016-03-09 NOTE — Telephone Encounter (Signed)
They do not have the orders because the orders are waiting in Dr Evette Georges basket for signature. I have requested for Dr Claiborne Billings to go into his basket and sign the orders.

## 2016-03-09 NOTE — Progress Notes (Signed)
OFFICE NOTE  Chief Complaint:  Follow-up echo and nuclear stress test  Primary Care Physician: Glo Herring., MD  HPI:  Timothy Mcdaniel  Is 70 year old gentleman with a history of hypertension and prior stroke, probably a small TIA, and was on Plavix in the past but is not on it currently. 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 Recently he had knee replacement on March 4 and is doing well recovering from that. He also has exogenous obesity and his weight has been going up. Overall, though, denies any chest pain, worsening shortness of breath, palpitations, presyncope, or syncopal symptoms. At his last office visit I started him on low-dose pravastatin do to a LDL cholesterol which was not at goal of less than 70. His total particle number was only mildly elevated at 1101. Since then, he has had about 30 pound weight gain and I expect his cholesterol is much higher.  Timothy Mcdaniel returns for routine follow-up today. He denies any chest pain or worsening shortly for his of breath. Blood pressure is well-controlled and apparently he is taking full dose hydrochlorothiazide 25 mg instead of 12.5 mg. He has no swelling.  Timothy Mcdaniel returns today for follow-up. He is reporting new progressive shortness of breath. Unfortunately he's had about 30 pound weight gain since his last office visit in October 2015. He says he religiously uses his CPAP although his devices over 58 years old and does not auto titrate. He reports some mild chest discomfort at rest while laying in a recliner. He notes significant shortness of breath with exertion. Particular a climbing stairs and walking some distances makes him very short of breath. He does have a history of nonischemic cardio myopathy with EF of 50%. He's never had heart catheterization but did have a negative stress test in the past, prior to me assuming his care. He denies any  orthopnea, but does wear CPAP at night. He reports some mild lower extremity swelling.  I saw Timothy Mcdaniel back today in follow-up. I'm pleased to say that his stress test was low risk and his echocardiogram both showed normal LV function. This is improved from his EF of 50% in the past. He does have diastolic dysfunction. The studies did not clearly explain why he's been short of breath other than his significant weight gain. We were attempting to get him a new CPAP machine. A referral was been made to his home care provider for this one week ago but has not yet received a phone call.  PMHx:  Past Medical History  Diagnosis Date  . Hypertension   . Stroke ([place])     ministroke  . Shortness of breath   . Recurrent upper respiratory infection (URI)     DEC. had flu shot- followed by bad cold, treated /w OTC  . Arthritis     OA- both knees , L shoulder   . Nonischemic cardiomyopathy ([place])   . OSA on CPAP   . Exogenous obesity   . OA (osteoarthritis)   . Hypercholesteremia     Past Surgical History  Procedure Laterality Date  . Mandible fracture surgery      1960's - occupational injury  . Total knee arthroplasty  02/15/2012    Procedure: TOTAL KNEE ARTHROPLASTY;  Surgeon: Rudean Haskell, MD;  Location: [place];  Service: Orthopedics;  Laterality: Left;  . Transthoracic echocardiogram  08/03/2011    EF [id number]; LV systolic funtion mildly  reduced; trace MR, mild TR  . Colonoscopy N/A 02/09/2014    Procedure: COLONOSCOPY;  Surgeon: Daneil Dolin, MD;  [facility];  Service: Endoscopy;  Laterality: N/A;  11:30 AM    FAMHx:  Family History  Problem Relation Age of Onset  . Anesthesia problems Neg Hx   . Hypotension Neg Hx   . Malignant hyperthermia Neg Hx   . Pseudochol deficiency Neg Hx   . Colon cancer Neg Hx   . Heart attack Mother   . Cancer Father   . Kidney disease Brother     SOCHx:   reports that he quit smoking about 22 years ago. His smoking use included  Cigarettes. He has a 14 pack-year smoking history. He does not have any smokeless tobacco history on file. He reports that he does not drink alcohol or use illicit drugs.  ALLERGIES:  No Known Allergies  ROS: Pertinent items noted in HPI and remainder of comprehensive ROS otherwise negative.  HOME MEDS: Current Outpatient Prescriptions  Medication Sig Dispense Refill  . aspirin 81 MG tablet Take 81 mg by mouth daily.    . brimonidine (ALPHAGAN) 0.15 % ophthalmic solution Place 1 drop into both eyes every morning.    . fenofibrate (TRICOR) 48 MG tablet TAKE ONE TABLET BY MOUTH ONCE DAILY 90 tablet 3  . Flaxseed, Linseed, (FLAXSEED OIL MAX STR) 1300 MG CAPS Take 1 capsule by mouth 3 (three) times daily.    . hydrochlorothiazide (HYDRODIURIL) 25 MG tablet Take 25 mg by mouth daily.    Marland Kitchen KRILL OIL OMEGA-3 PO Take 350 mg by mouth daily.    Marland Kitchen latanoprost (XALATAN) 0.005 % ophthalmic solution Place 1 drop into both eyes at bedtime.    Marland Kitchen lisinopril (PRINIVIL,ZESTRIL) 40 MG tablet Take 40 mg by mouth daily.    . Multiple Vitamins-Minerals (CENTRUM SILVER ADULT 50+ PO) Take 1 tablet by mouth daily.     No current facility-administered medications for this visit.    LABS/IMAGING: No results found for this or any previous visit (from the past 48 hour(s)). No results found.  VITALS: BP 122/80 mmHg  Pulse 85  Ht 5\' 11"  (1.803 m)  Wt 304 lb 12.8 oz (138.256 kg)  BMI 42.53 kg/m2  EXAM: Deferred  EKG: Deferred  ASSESSMENT: 1. Progressive dyspnea on exertion - likely due to weight gain (low risk nuclear stress test) 2. Nonischemic cardiomyopathy, EF around 50% -> improved to 55-60% by recent echo 3. Hypertension 4. Obesity 5. Obstructive sleep apnea on CPAP - with persistent fatigue and poor sleep 6. History of TIA  PLAN: 1.   Timothy Mcdaniel has shortness of breath with exertion which I believe is mostly related to 30 pound weight gain. His stress test in fact shows improved LV function  as well as assess echocardiogram. He does have moderate LVH and diastolic dysfunction but no evidence of volume overload. I've encouraged him to start a low to moderate intensity exercise program and increase that as he feels he is able to. He also needs to make significant dietary changes. Blood pressure is really excellent today. With regards to CPAP, a referral has been made for a new CPAP machine. I advised him to contact his home supply company and if they have not received the order then we will reorder it.  Follow-up in 6 months.  Pixie Casino, MD, [facility] Attending Cardiologist [place] Xayvier Vallez 03/09/2016, 9:40 AM

## 2016-03-16 DIAGNOSIS — H401112 Primary open-angle glaucoma, right eye, moderate stage: Secondary | ICD-10-CM | POA: Diagnosis not present

## 2016-03-16 DIAGNOSIS — H401122 Primary open-angle glaucoma, left eye, moderate stage: Secondary | ICD-10-CM | POA: Diagnosis not present

## 2016-03-25 ENCOUNTER — Telehealth: Payer: Self-pay | Admitting: *Deleted

## 2016-03-25 NOTE — Telephone Encounter (Signed)
Left message to return a call to discuss CPAP machine. ?

## 2016-04-01 ENCOUNTER — Telehealth: Payer: Self-pay | Admitting: *Deleted

## 2016-04-01 NOTE — Telephone Encounter (Signed)
Left message to return a call to schedule a time to  See Dr Claiborne Billings on 06/24/16 to follow up on new CPAP machine.

## 2016-04-14 DIAGNOSIS — R739 Hyperglycemia, unspecified: Secondary | ICD-10-CM | POA: Diagnosis not present

## 2016-04-14 DIAGNOSIS — M159 Polyosteoarthritis, unspecified: Secondary | ICD-10-CM | POA: Diagnosis not present

## 2016-04-14 DIAGNOSIS — Z0001 Encounter for general adult medical examination with abnormal findings: Secondary | ICD-10-CM | POA: Diagnosis not present

## 2016-04-14 DIAGNOSIS — R7309 Other abnormal glucose: Secondary | ICD-10-CM | POA: Diagnosis not present

## 2016-04-14 DIAGNOSIS — Z1389 Encounter for screening for other disorder: Secondary | ICD-10-CM | POA: Diagnosis not present

## 2016-04-14 DIAGNOSIS — M1991 Primary osteoarthritis, unspecified site: Secondary | ICD-10-CM | POA: Diagnosis not present

## 2016-04-14 DIAGNOSIS — I10 Essential (primary) hypertension: Secondary | ICD-10-CM | POA: Diagnosis not present

## 2016-04-14 DIAGNOSIS — Z6841 Body Mass Index (BMI) 40.0 and over, adult: Secondary | ICD-10-CM | POA: Diagnosis not present

## 2016-04-14 DIAGNOSIS — Z125 Encounter for screening for malignant neoplasm of prostate: Secondary | ICD-10-CM | POA: Diagnosis not present

## 2016-04-14 DIAGNOSIS — Z79899 Other long term (current) drug therapy: Secondary | ICD-10-CM | POA: Diagnosis not present

## 2016-04-28 ENCOUNTER — Telehealth: Payer: Self-pay | Admitting: Cardiovascular Disease

## 2016-04-29 NOTE — Telephone Encounter (Signed)
Close encounter 

## 2016-05-13 ENCOUNTER — Telehealth: Payer: Self-pay | Admitting: Cardiovascular Disease

## 2016-05-13 NOTE — Telephone Encounter (Signed)
Close encounter 

## 2016-05-25 ENCOUNTER — Telehealth: Payer: Self-pay | Admitting: Cardiovascular Disease

## 2016-05-26 NOTE — Telephone Encounter (Signed)
F/u  Pt called back to sched appt- sleep clinic- offered next avail Sept/2017- requested to speak w/ Rollene Fare. Please call back and discuss.

## 2016-09-01 ENCOUNTER — Ambulatory Visit: Payer: Medicare Other | Admitting: Internal Medicine

## 2016-11-09 DIAGNOSIS — M19011 Primary osteoarthritis, right shoulder: Secondary | ICD-10-CM | POA: Diagnosis not present

## 2016-11-09 DIAGNOSIS — M25511 Pain in right shoulder: Secondary | ICD-10-CM | POA: Diagnosis not present

## 2017-04-07 DIAGNOSIS — H35361 Drusen (degenerative) of macula, right eye: Secondary | ICD-10-CM | POA: Diagnosis not present

## 2017-04-07 DIAGNOSIS — H401122 Primary open-angle glaucoma, left eye, moderate stage: Secondary | ICD-10-CM | POA: Diagnosis not present

## 2017-04-07 DIAGNOSIS — H401112 Primary open-angle glaucoma, right eye, moderate stage: Secondary | ICD-10-CM | POA: Diagnosis not present

## 2017-04-07 DIAGNOSIS — H2512 Age-related nuclear cataract, left eye: Secondary | ICD-10-CM | POA: Diagnosis not present

## 2017-05-19 DIAGNOSIS — Z6841 Body Mass Index (BMI) 40.0 and over, adult: Secondary | ICD-10-CM | POA: Diagnosis not present

## 2017-05-19 DIAGNOSIS — I429 Cardiomyopathy, unspecified: Secondary | ICD-10-CM | POA: Diagnosis not present

## 2017-05-19 DIAGNOSIS — Z1389 Encounter for screening for other disorder: Secondary | ICD-10-CM | POA: Diagnosis not present

## 2017-05-19 DIAGNOSIS — Z0001 Encounter for general adult medical examination with abnormal findings: Secondary | ICD-10-CM | POA: Diagnosis not present

## 2017-05-19 DIAGNOSIS — M47814 Spondylosis without myelopathy or radiculopathy, thoracic region: Secondary | ICD-10-CM | POA: Diagnosis not present

## 2017-05-19 DIAGNOSIS — M1991 Primary osteoarthritis, unspecified site: Secondary | ICD-10-CM | POA: Diagnosis not present

## 2017-05-19 DIAGNOSIS — E669 Obesity, unspecified: Secondary | ICD-10-CM | POA: Diagnosis not present

## 2017-07-05 ENCOUNTER — Other Ambulatory Visit: Payer: Self-pay

## 2017-07-28 DIAGNOSIS — H401112 Primary open-angle glaucoma, right eye, moderate stage: Secondary | ICD-10-CM | POA: Diagnosis not present

## 2017-09-21 DIAGNOSIS — Z23 Encounter for immunization: Secondary | ICD-10-CM | POA: Diagnosis not present

## 2017-10-04 DIAGNOSIS — B029 Zoster without complications: Secondary | ICD-10-CM | POA: Diagnosis not present

## 2017-12-29 DIAGNOSIS — I10 Essential (primary) hypertension: Secondary | ICD-10-CM | POA: Diagnosis not present

## 2017-12-29 DIAGNOSIS — M1991 Primary osteoarthritis, unspecified site: Secondary | ICD-10-CM | POA: Diagnosis not present

## 2017-12-29 DIAGNOSIS — Z6841 Body Mass Index (BMI) 40.0 and over, adult: Secondary | ICD-10-CM | POA: Diagnosis not present

## 2017-12-29 DIAGNOSIS — E669 Obesity, unspecified: Secondary | ICD-10-CM | POA: Diagnosis not present

## 2017-12-29 DIAGNOSIS — I428 Other cardiomyopathies: Secondary | ICD-10-CM | POA: Diagnosis not present

## 2017-12-29 DIAGNOSIS — B029 Zoster without complications: Secondary | ICD-10-CM | POA: Diagnosis not present

## 2017-12-29 DIAGNOSIS — Z1389 Encounter for screening for other disorder: Secondary | ICD-10-CM | POA: Diagnosis not present

## 2018-04-06 DIAGNOSIS — H401122 Primary open-angle glaucoma, left eye, moderate stage: Secondary | ICD-10-CM | POA: Diagnosis not present

## 2018-04-06 DIAGNOSIS — H401112 Primary open-angle glaucoma, right eye, moderate stage: Secondary | ICD-10-CM | POA: Diagnosis not present

## 2018-04-06 DIAGNOSIS — H2512 Age-related nuclear cataract, left eye: Secondary | ICD-10-CM | POA: Diagnosis not present

## 2018-04-06 DIAGNOSIS — Z961 Presence of intraocular lens: Secondary | ICD-10-CM | POA: Diagnosis not present

## 2018-05-10 DIAGNOSIS — G894 Chronic pain syndrome: Secondary | ICD-10-CM | POA: Diagnosis not present

## 2018-05-10 DIAGNOSIS — Z23 Encounter for immunization: Secondary | ICD-10-CM | POA: Diagnosis not present

## 2018-05-10 DIAGNOSIS — M1991 Primary osteoarthritis, unspecified site: Secondary | ICD-10-CM | POA: Diagnosis not present

## 2018-05-10 DIAGNOSIS — Z6841 Body Mass Index (BMI) 40.0 and over, adult: Secondary | ICD-10-CM | POA: Diagnosis not present

## 2018-05-10 DIAGNOSIS — Z1389 Encounter for screening for other disorder: Secondary | ICD-10-CM | POA: Diagnosis not present

## 2018-05-10 DIAGNOSIS — G4700 Insomnia, unspecified: Secondary | ICD-10-CM | POA: Diagnosis not present

## 2018-08-29 DIAGNOSIS — Z6839 Body mass index (BMI) 39.0-39.9, adult: Secondary | ICD-10-CM | POA: Diagnosis not present

## 2018-08-29 DIAGNOSIS — M5431 Sciatica, right side: Secondary | ICD-10-CM | POA: Diagnosis not present

## 2018-08-29 DIAGNOSIS — M545 Low back pain: Secondary | ICD-10-CM | POA: Diagnosis not present

## 2018-09-27 DIAGNOSIS — G4730 Sleep apnea, unspecified: Secondary | ICD-10-CM | POA: Diagnosis not present

## 2018-09-27 DIAGNOSIS — Z6841 Body Mass Index (BMI) 40.0 and over, adult: Secondary | ICD-10-CM | POA: Diagnosis not present

## 2018-09-27 DIAGNOSIS — Z125 Encounter for screening for malignant neoplasm of prostate: Secondary | ICD-10-CM | POA: Diagnosis not present

## 2018-09-27 DIAGNOSIS — Z1389 Encounter for screening for other disorder: Secondary | ICD-10-CM | POA: Diagnosis not present

## 2018-09-27 DIAGNOSIS — R7309 Other abnormal glucose: Secondary | ICD-10-CM | POA: Diagnosis not present

## 2018-09-27 DIAGNOSIS — M47816 Spondylosis without myelopathy or radiculopathy, lumbar region: Secondary | ICD-10-CM | POA: Diagnosis not present

## 2018-09-27 DIAGNOSIS — M1991 Primary osteoarthritis, unspecified site: Secondary | ICD-10-CM | POA: Diagnosis not present

## 2018-09-27 DIAGNOSIS — E748 Other specified disorders of carbohydrate metabolism: Secondary | ICD-10-CM | POA: Diagnosis not present

## 2018-09-27 DIAGNOSIS — G4739 Other sleep apnea: Secondary | ICD-10-CM | POA: Diagnosis not present

## 2018-09-27 DIAGNOSIS — G8929 Other chronic pain: Secondary | ICD-10-CM | POA: Diagnosis not present

## 2018-09-27 DIAGNOSIS — M545 Low back pain: Secondary | ICD-10-CM | POA: Diagnosis not present

## 2018-09-27 DIAGNOSIS — R946 Abnormal results of thyroid function studies: Secondary | ICD-10-CM | POA: Diagnosis not present

## 2018-09-27 DIAGNOSIS — Z0001 Encounter for general adult medical examination with abnormal findings: Secondary | ICD-10-CM | POA: Diagnosis not present

## 2018-11-21 DIAGNOSIS — M25561 Pain in right knee: Secondary | ICD-10-CM | POA: Diagnosis not present

## 2019-03-15 DIAGNOSIS — M179 Osteoarthritis of knee, unspecified: Secondary | ICD-10-CM | POA: Diagnosis not present

## 2019-03-15 DIAGNOSIS — G4739 Other sleep apnea: Secondary | ICD-10-CM | POA: Diagnosis not present

## 2019-05-03 DIAGNOSIS — G894 Chronic pain syndrome: Secondary | ICD-10-CM | POA: Diagnosis not present

## 2019-05-11 DIAGNOSIS — H401122 Primary open-angle glaucoma, left eye, moderate stage: Secondary | ICD-10-CM | POA: Diagnosis not present

## 2019-05-11 DIAGNOSIS — H2512 Age-related nuclear cataract, left eye: Secondary | ICD-10-CM | POA: Diagnosis not present

## 2019-05-11 DIAGNOSIS — H401112 Primary open-angle glaucoma, right eye, moderate stage: Secondary | ICD-10-CM | POA: Diagnosis not present

## 2019-05-19 DIAGNOSIS — H401131 Primary open-angle glaucoma, bilateral, mild stage: Secondary | ICD-10-CM | POA: Diagnosis not present

## 2019-05-19 DIAGNOSIS — H2512 Age-related nuclear cataract, left eye: Secondary | ICD-10-CM | POA: Diagnosis not present

## 2019-06-07 DIAGNOSIS — H2512 Age-related nuclear cataract, left eye: Secondary | ICD-10-CM | POA: Diagnosis not present

## 2019-06-08 DIAGNOSIS — H401121 Primary open-angle glaucoma, left eye, mild stage: Secondary | ICD-10-CM | POA: Diagnosis not present

## 2019-06-08 DIAGNOSIS — H2512 Age-related nuclear cataract, left eye: Secondary | ICD-10-CM | POA: Diagnosis not present

## 2019-09-05 DIAGNOSIS — Z23 Encounter for immunization: Secondary | ICD-10-CM | POA: Diagnosis not present

## 2019-09-28 DIAGNOSIS — E560 Deficiency of vitamin E: Secondary | ICD-10-CM | POA: Diagnosis not present

## 2019-09-28 DIAGNOSIS — Z0000 Encounter for general adult medical examination without abnormal findings: Secondary | ICD-10-CM | POA: Diagnosis not present

## 2019-09-28 DIAGNOSIS — Z6841 Body Mass Index (BMI) 40.0 and over, adult: Secondary | ICD-10-CM | POA: Diagnosis not present

## 2019-09-28 DIAGNOSIS — E039 Hypothyroidism, unspecified: Secondary | ICD-10-CM | POA: Diagnosis not present

## 2019-09-28 DIAGNOSIS — I10 Essential (primary) hypertension: Secondary | ICD-10-CM | POA: Diagnosis not present

## 2019-09-28 DIAGNOSIS — Z1389 Encounter for screening for other disorder: Secondary | ICD-10-CM | POA: Diagnosis not present

## 2019-09-28 DIAGNOSIS — Z23 Encounter for immunization: Secondary | ICD-10-CM | POA: Diagnosis not present

## 2019-09-28 DIAGNOSIS — Z79899 Other long term (current) drug therapy: Secondary | ICD-10-CM | POA: Diagnosis not present

## 2019-09-28 DIAGNOSIS — G4739 Other sleep apnea: Secondary | ICD-10-CM | POA: Diagnosis not present

## 2019-09-28 DIAGNOSIS — G894 Chronic pain syndrome: Secondary | ICD-10-CM | POA: Diagnosis not present

## 2019-09-28 DIAGNOSIS — M1991 Primary osteoarthritis, unspecified site: Secondary | ICD-10-CM | POA: Diagnosis not present

## 2019-09-28 DIAGNOSIS — N529 Male erectile dysfunction, unspecified: Secondary | ICD-10-CM | POA: Diagnosis not present

## 2019-10-10 ENCOUNTER — Emergency Department (HOSPITAL_COMMUNITY): Payer: Medicare Other

## 2019-10-10 ENCOUNTER — Encounter (HOSPITAL_COMMUNITY): Payer: Self-pay | Admitting: Emergency Medicine

## 2019-10-10 ENCOUNTER — Emergency Department (HOSPITAL_COMMUNITY)
Admission: EM | Admit: 2019-10-10 | Discharge: 2019-10-10 | Disposition: A | Discharge status: Home / Self Care | Payer: Medicare Other | Attending: Emergency Medicine | Admitting: Emergency Medicine

## 2019-10-10 ENCOUNTER — Other Ambulatory Visit: Payer: Self-pay

## 2019-10-10 DIAGNOSIS — Z8673 Personal history of transient ischemic attack (TIA), and cerebral infarction without residual deficits: Secondary | ICD-10-CM | POA: Diagnosis not present

## 2019-10-10 DIAGNOSIS — Z96652 Presence of left artificial knee joint: Secondary | ICD-10-CM | POA: Insufficient documentation

## 2019-10-10 DIAGNOSIS — Z7982 Long term (current) use of aspirin: Secondary | ICD-10-CM | POA: Diagnosis not present

## 2019-10-10 DIAGNOSIS — R0602 Shortness of breath: Secondary | ICD-10-CM | POA: Diagnosis not present

## 2019-10-10 DIAGNOSIS — R0789 Other chest pain: Secondary | ICD-10-CM | POA: Diagnosis present

## 2019-10-10 DIAGNOSIS — Z0389 Encounter for observation for other suspected diseases and conditions ruled out: Secondary | ICD-10-CM | POA: Diagnosis not present

## 2019-10-10 DIAGNOSIS — Z79899 Other long term (current) drug therapy: Secondary | ICD-10-CM | POA: Diagnosis not present

## 2019-10-10 DIAGNOSIS — R0781 Pleurodynia: Secondary | ICD-10-CM | POA: Diagnosis not present

## 2019-10-10 DIAGNOSIS — I10 Essential (primary) hypertension: Secondary | ICD-10-CM | POA: Diagnosis not present

## 2019-10-10 DIAGNOSIS — R079 Chest pain, unspecified: Secondary | ICD-10-CM

## 2019-10-10 LAB — CBC WITH DIFFERENTIAL/PLATELET
Abs Immature Granulocytes: 0.02 10*3/uL (ref 0.00–0.07)
Basophils Absolute: 0.1 10*3/uL (ref 0.0–0.1)
Basophils Relative: 1 %
Eosinophils Absolute: 0.3 10*3/uL (ref 0.0–0.5)
Eosinophils Relative: 4 %
HCT: 41.9 % (ref 39.0–52.0)
Hemoglobin: 13.4 g/dL (ref 13.0–17.0)
Immature Granulocytes: 0 %
Lymphocytes Relative: 41 %
Lymphs Abs: 3 10*3/uL (ref 0.7–4.0)
MCH: 29.5 pg (ref 26.0–34.0)
MCHC: 32 g/dL (ref 30.0–36.0)
MCV: 92.3 fL (ref 80.0–100.0)
Monocytes Absolute: 0.6 10*3/uL (ref 0.1–1.0)
Monocytes Relative: 8 %
Neutro Abs: 3.3 10*3/uL (ref 1.7–7.7)
Neutrophils Relative %: 46 %
Platelets: 282 10*3/uL (ref 150–400)
RBC: 4.54 MIL/uL (ref 4.22–5.81)
RDW: 14.8 % (ref 11.5–15.5)
WBC: 7.3 10*3/uL (ref 4.0–10.5)
nRBC: 0 % (ref 0.0–0.2)

## 2019-10-10 LAB — BASIC METABOLIC PANEL
Anion gap: 9 (ref 5–15)
BUN: 32 mg/dL — ABNORMAL HIGH (ref 8–23)
CO2: 23 mmol/L (ref 22–32)
Calcium: 9 mg/dL (ref 8.9–10.3)
Chloride: 106 mmol/L (ref 98–111)
Creatinine, Ser: 0.93 mg/dL (ref 0.61–1.24)
GFR calc Af Amer: >60 mL/min (ref >60)
GFR calc non Af Amer: >60 mL/min (ref >60)
Glucose, Bld: 109 mg/dL — ABNORMAL HIGH (ref 70–99)
Potassium: 3.8 mmol/L (ref 3.5–5.1)
Sodium: 138 mmol/L (ref 135–145)

## 2019-10-10 LAB — D-DIMER, QUANTITATIVE: D-Dimer, Quant: 0.77 ug/mL-FEU — ABNORMAL HIGH (ref 0.00–0.50)

## 2019-10-10 LAB — TROPONIN I (HIGH SENSITIVITY)
Troponin I (High Sensitivity): 4 ng/L (ref <18)
Troponin I (High Sensitivity): 4 ng/L (ref <18)

## 2019-10-10 MED ORDER — KETOROLAC TROMETHAMINE 30 MG/ML IJ SOLN
15.0000 mg | Freq: Once | INTRAMUSCULAR | Status: AC
  Administered 2019-10-10: 15 mg via INTRAVENOUS
  Filled 2019-10-10: qty 1

## 2019-10-10 MED ORDER — IOHEXOL 350 MG/ML SOLN
100.0000 mL | Freq: Once | INTRAVENOUS | Status: AC | PRN
  Administered 2019-10-10: 100 mL via INTRAVENOUS

## 2019-10-10 MED ORDER — SODIUM CHLORIDE 0.9% FLUSH
3.0000 mL | Freq: Once | INTRAVENOUS | Status: AC
  Administered 2019-10-10: 3 mL via INTRAVENOUS

## 2019-10-10 NOTE — ED Triage Notes (Signed)
Patient states he has been having chest pain that has been ongoing the last few days. Patient states pain is worse from center tom chest into left arm. Patient  states that he takes aspirin daily. Patient states he took aspirin yesterday.

## 2019-10-10 NOTE — Discharge Instructions (Addendum)
Take ibuprofen or naproxen as needed.  Return if your symptoms are getting worse.

## 2019-10-10 NOTE — ED Provider Notes (Signed)
[facility] EMERGENCY DEPARTMENT Provider Note   CSN: [id number] Arrival date & time: 10/10/19  0222    History   Chief Complaint Chief Complaint  Patient presents with   Chest Pain    HPI Timothy Mcdaniel is a 73 y.o. male.   The history is provided by the patient.  He has history of nonischemic cardiomyopathy and comes in complaining of chest pain for the last 2 days.  Pain is central and left-sided with some radiation toward the left shoulder.  Pain initially was dull, but now is sharp.  It is worse when he lays flat and worse when he takes a deep breath but is actually better when he walks around.  He is unable to put a number on the pain.  He did have some slight dyspnea but there has been no nausea or diaphoresis.  He denies any cough or fever.  He took aspirin 405 mg twice today without any improvement.  He is a non-smoker but does have history of hypertension and hyperlipidemia.  He does not have diabetes.  He does have history of transient ischemic attack.  He has not seen his cardiologist for several years.  Past Medical History:  Diagnosis Date   Arthritis    OA- both knees , L shoulder    Exogenous obesity    Hypercholesteremia    Hypertension    Nonischemic cardiomyopathy ([facility])    OA (osteoarthritis)    OSA on CPAP    Recurrent upper respiratory infection (URI)    DEC. had flu shot- followed by bad cold, treated /w OTC   Shortness of breath    Stroke [facility])    ministroke    Patient Active Problem List   Diagnosis Date Noted   DOE (dyspnea on exertion) 02/11/2016   NICM (nonischemic cardiomyopathy) ([place]) 07/20/2013   HTN (hypertension) 07/20/2013   Morbid obesity ([place]) 07/20/2013   OSA on CPAP 07/20/2013   TIA (transient ischemic attack) 07/20/2013    Past Surgical History:  Procedure Laterality Date   COLONOSCOPY N/A 02/09/2014   Procedure: COLONOSCOPY;  Surgeon: Daneil Dolin, MD;  [facility];  Service: Endoscopy;   Laterality: N/A;  11:30 AM   MANDIBLE FRACTURE SURGERY     1960's - occupational injury   TOTAL KNEE ARTHROPLASTY  02/15/2012   Procedure: TOTAL KNEE ARTHROPLASTY;  Surgeon: Rudean Haskell, MD;  Location: [place];  Service: Orthopedics;  Laterality: Left;   TRANSTHORACIC ECHOCARDIOGRAM  08/03/2011   EF [id number]; LV systolic funtion mildly reduced; trace MR, mild TR        Home Medications    Prior to Admission medications   Medication Sig Start Date End Date Taking? Authorizing Provider  aspirin 81 MG tablet Take 81 mg by mouth daily.    [provider]  brimonidine (ALPHAGAN) 0.15 % ophthalmic solution Place 1 drop into both eyes every morning.    [provider]  fenofibrate (TRICOR) 48 MG tablet TAKE ONE TABLET BY MOUTH ONCE DAILY 10/31/14   Hilty, Nadean Corwin, MD  Flaxseed, Linseed, (FLAXSEED OIL MAX STR) 1300 MG CAPS Take 1 capsule by mouth 3 (three) times daily.    [provider]  hydrochlorothiazide (HYDRODIURIL) 25 MG tablet Take 25 mg by mouth daily.    [provider]  KRILL OIL OMEGA-3 PO Take 350 mg by mouth daily.    [provider]  latanoprost (XALATAN) 0.005 % ophthalmic solution Place 1 drop into both eyes at bedtime.  [provider]  lisinopril (PRINIVIL,ZESTRIL) 40 MG tablet Take 40 mg by mouth daily.    [provider]  Multiple Vitamins-Minerals (CENTRUM SILVER ADULT 50+ PO) Take 1 tablet by mouth daily.    [provider]    Family History Family History  Problem Relation Age of Onset   Heart attack Mother    Cancer Father    Kidney disease Brother    Anesthesia problems Neg Hx    Hypotension Neg Hx    Malignant hyperthermia Neg Hx    Pseudochol deficiency Neg Hx    Colon cancer Neg Hx     Social History Social History   Tobacco Use   Smoking status: Former Smoker    Packs/day: 0.50    Years: 28.00    Pack years: 14.00    Types: Cigarettes    Quit date: 02/03/1994     Years since quitting: 25.6   Smokeless tobacco: Never Used  Substance Use Topics   Alcohol use: No   Drug use: No     Allergies   Patient has no known allergies.   Review of Systems Review of Systems  All other systems reviewed and are negative.    Physical Exam Updated Vital Signs BP 130/79    Pulse 65    Temp 97.8 F (36.6 C) (Oral)    Resp 15    Ht 5\' 11"  (1.803 m)    Wt 122.5 kg    SpO2 96%    BMI 37.66 kg/m   Physical Exam Vitals signs and nursing note reviewed.    73 year old male, resting comfortably and in no acute distress. Vital signs are normal. Oxygen saturation is 96%, which is normal. Head is normocephalic and atraumatic. PERRLA, EOMI. Oropharynx is clear. Neck is nontender and supple without adenopathy or JVD. Back is nontender and there is no CVA tenderness. Lungs are clear without rales, wheezes, or rhonchi. Chest is mildly tender in the left parasternal area.  There is no crepitus. Heart has regular rate and rhythm without murmur. Abdomen is soft, flat, nontender without masses or hepatosplenomegaly and peristalsis is normoactive. Extremities have trace edema, full range of motion is present. Skin is warm and dry without rash. Neurologic: Mental status is normal, cranial nerves are intact, there are no motor or sensory deficits.  ED Treatments / Results  Labs (all labs ordered are listed, but only abnormal results are displayed) Labs Reviewed  BASIC METABOLIC PANEL - Abnormal; Notable for the following components:      Result Value   Glucose, Bld 109 (*)    BUN 32 (*)    All other components within normal limits  D-DIMER, QUANTITATIVE (NOT AT [facility]) - Abnormal; Notable for the following components:   D-Dimer, Quant 0.77 (*)    All other components within normal limits  CBC WITH DIFFERENTIAL/PLATELET  TROPONIN I (HIGH SENSITIVITY)  TROPONIN I (HIGH SENSITIVITY)    EKG EKG Interpretation  Date/Time:  Tuesday October 10 2019 02:48:50  EDT Ventricular Rate:  77 PR Interval:    QRS Duration: 139 QT Interval:  415 QTC Calculation: 470 R Axis:   -60 Text Interpretation: Sinus rhythm LVH with IVCD, LAD and secondary repol abnrm Baseline wander in lead(s) V4 No old tracing to compare Confirmed by Delora Fuel ([id number]) on 10/10/2019 2:50:40 AM   Radiology Ct Angio Chest Pe W And/or Wo Contrast  Result Date: 10/10/2019 CLINICAL DATA:  Intermediate probability for pulmonary embolism EXAM: CT ANGIOGRAPHY CHEST WITH CONTRAST TECHNIQUE:  Multidetector CT imaging of the chest was performed using the standard protocol during bolus administration of intravenous contrast. Multiplanar CT image reconstructions and MIPs were obtained to evaluate the vascular anatomy. CONTRAST:  128mL OMNIPAQUE IOHEXOL 350 MG/ML SOLN COMPARISON:  08/16/2003 CT report FINDINGS: Cardiovascular: Satisfactory opacification of the pulmonary arteries to the segmental level. No evidence of pulmonary embolism. Normal heart size. No pericardial effusion. Mild atherosclerotic calcification for age. Mediastinum/Nodes: No adenopathy or mass. Lungs/Pleura: Lungs are clear. No pleural effusion or pneumothorax. Upper Abdomen: Hepatic steatosis with pericholecystic sparing Musculoskeletal: Spondylosis with multi-level ankylosis. No acute finding Review of the MIP images confirms the above findings. IMPRESSION: 1. Negative for pulmonary embolism or other acute finding. 2. Hepatic steatosis. Electronically Signed   By: Monte Fantasia M.D.   On: 10/10/2019 04:47   Dg Chest Port 1 View  Result Date: 10/10/2019 CLINICAL DATA:  Shortness of breath for several days EXAM: PORTABLE CHEST 1 VIEW COMPARISON:  02/04/2012 FINDINGS: The heart size and mediastinal contours are within normal limits. Both lungs are clear. The visualized skeletal structures are unremarkable. IMPRESSION: No active disease. Electronically Signed   By: Inez Catalina M.D.   On: 10/10/2019 03:29     Procedures Procedures  Medications Ordered in ED Medications  sodium chloride flush (NS) 0.9 % injection 3 mL (3 mLs Intravenous Given 10/10/19 0256)  ketorolac (TORADOL) 30 MG/ML injection 15 mg (15 mg Intravenous Given 10/10/19 0317)  iohexol (OMNIPAQUE) 350 MG/ML injection 100 mL (100 mLs Intravenous Contrast Given 10/10/19 0412)     Initial Impression / Assessment and Plan / ED Course  I have reviewed the triage vital signs and the nursing notes.  Pertinent labs & imaging results that were available during my care of the patient were reviewed by me and considered in my medical decision making (see chart for details).  Pleuritic chest pain.  Constant pain for 2 days is unlikely to be ACS.  ECG shows no acute changes and is unchanged from ECG in 2017.  Will check chest x-ray, screening labs and will give therapeutic trial ketorolac.  Old records are reviewed confirming history of nonischemic cardiomyopathy with last echocardiogram showing normal ejection fraction, last cardiology visit in March 2017.  Chest x-ray is unremarkable.  Labs are significant for borderline elevated D-dimer.  He is sent for CT angiogram of the chest which showed no evidence of pulmonary embolism.  He had excellent relief of pain with single dose of IV ketorolac.  This is felt to be pleurisy, possible costochondritis.  Patient is advised of findings and told to use over-the-counter NSAIDs as needed for pain.  Return precautions discussed.  Final Clinical Impressions(s) / ED Diagnoses   Final diagnoses:  Pleuritic chest pain    ED Discharge Orders    None       Delora Fuel, MD [id number]

## 2020-02-15 DIAGNOSIS — Z23 Encounter for immunization: Secondary | ICD-10-CM | POA: Diagnosis not present

## 2020-03-13 DIAGNOSIS — H401122 Primary open-angle glaucoma, left eye, moderate stage: Secondary | ICD-10-CM | POA: Diagnosis not present

## 2020-03-13 DIAGNOSIS — H401112 Primary open-angle glaucoma, right eye, moderate stage: Secondary | ICD-10-CM | POA: Diagnosis not present

## 2020-03-22 DIAGNOSIS — Z23 Encounter for immunization: Secondary | ICD-10-CM | POA: Diagnosis not present

## 2020-07-30 DIAGNOSIS — Z6841 Body Mass Index (BMI) 40.0 and over, adult: Secondary | ICD-10-CM | POA: Diagnosis not present

## 2020-07-30 DIAGNOSIS — G4733 Obstructive sleep apnea (adult) (pediatric): Secondary | ICD-10-CM | POA: Diagnosis not present

## 2020-07-30 DIAGNOSIS — R040 Epistaxis: Secondary | ICD-10-CM | POA: Diagnosis not present

## 2020-07-30 DIAGNOSIS — M1991 Primary osteoarthritis, unspecified site: Secondary | ICD-10-CM | POA: Diagnosis not present

## 2020-07-30 DIAGNOSIS — I10 Essential (primary) hypertension: Secondary | ICD-10-CM | POA: Diagnosis not present

## 2020-07-30 DIAGNOSIS — Z1389 Encounter for screening for other disorder: Secondary | ICD-10-CM | POA: Diagnosis not present

## 2020-07-31 DIAGNOSIS — H401132 Primary open-angle glaucoma, bilateral, moderate stage: Secondary | ICD-10-CM | POA: Diagnosis not present

## 2020-09-17 IMAGING — CT CT ANGIO CHEST
2 of 6 series · 18 of 46 positions shown · IV contrast (omnipaque)
Comparison: 08/16/2003 CT report

CLINICAL DATA: Intermediate probability for pulmonary embolism

EXAM:
CT ANGIOGRAPHY CHEST WITH CONTRAST
TECHNIQUE: Multidetector CT imaging of the chest was performed using the
standard protocol during bolus administration of intravenous
contrast. Multiplanar CT image reconstructions and MIPs were
obtained to evaluate the vascular anatomy.
CONTRAST:  100mL OMNIPAQUE IOHEXOL 350 MG/ML SOLN

[Series 5: pe axial thins · axial · 0.81mm/px · z∈[+1351,+1587]mm · 15 of 260 slices shown]
[im 12/260  lung]
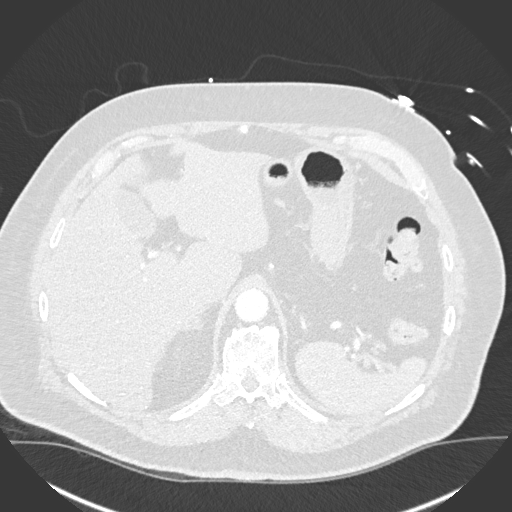
[im 34/260  soft-tissue]
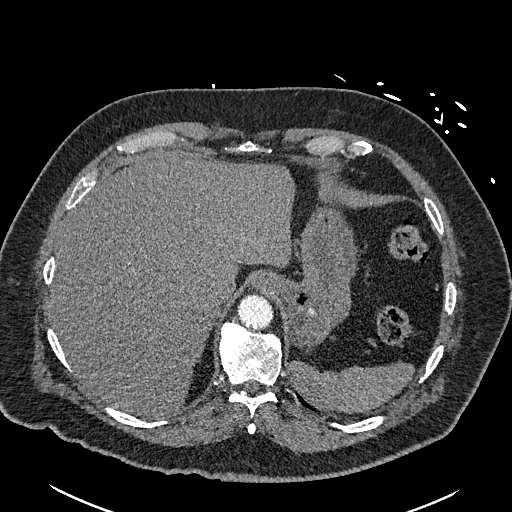
[im 46/260  lung]
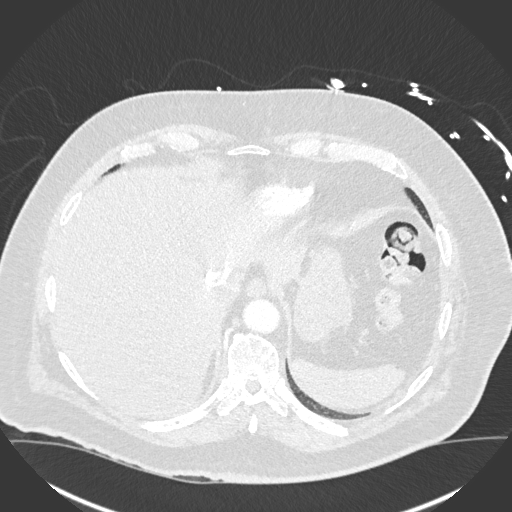
[im 68/260  soft-tissue]
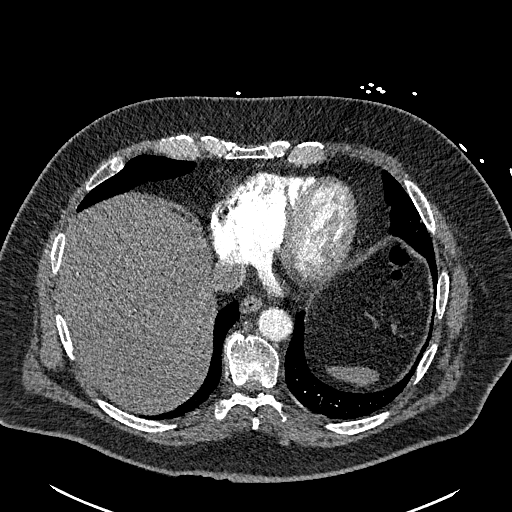
[im 79/260  lung]
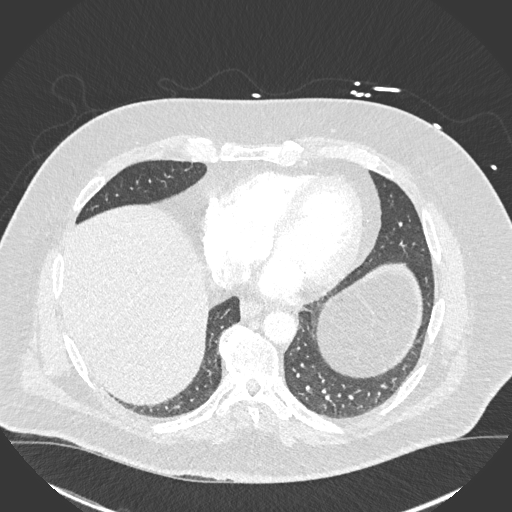
[im 102/260  soft-tissue]
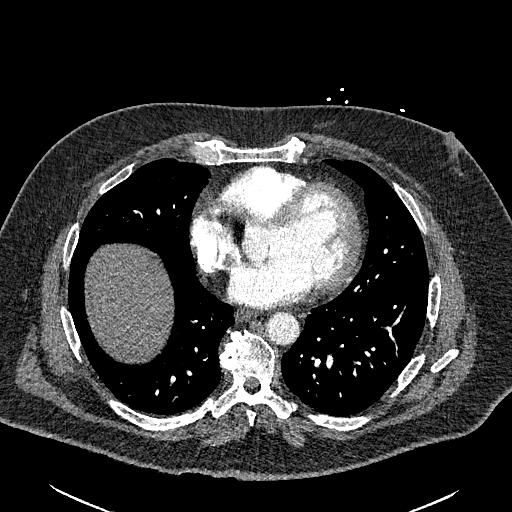
[im 113/260  lung]
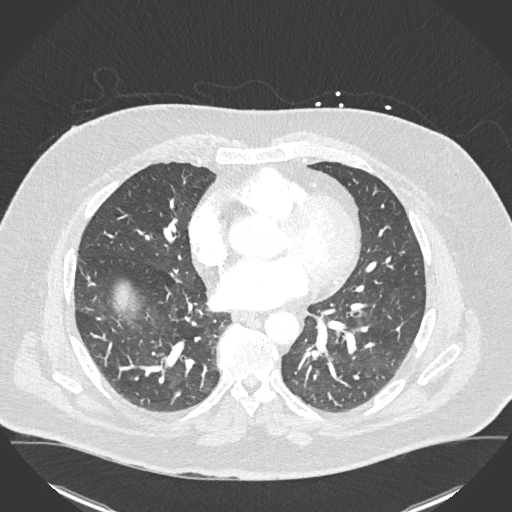
[im 136/260  soft-tissue]
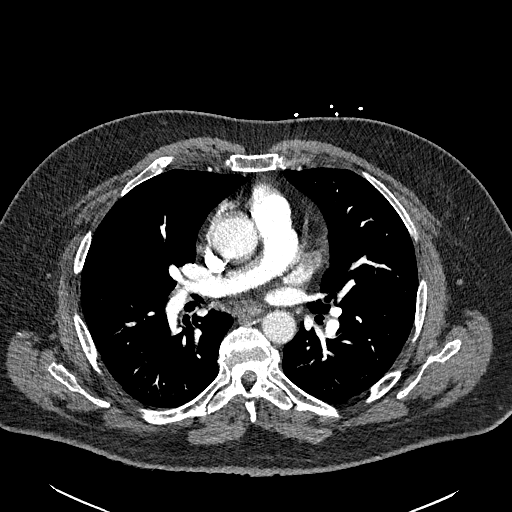
[im 147/260  lung]
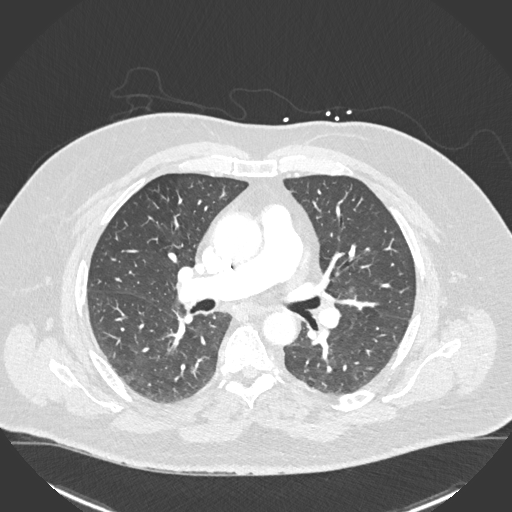
[im 158/260  soft-tissue]
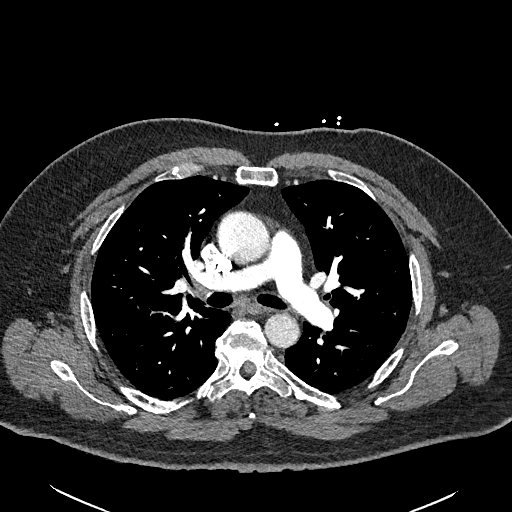
[im 181/260  lung]
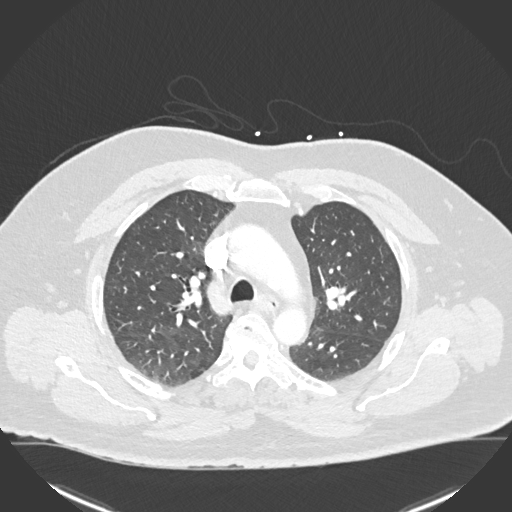
[im 192/260  soft-tissue]
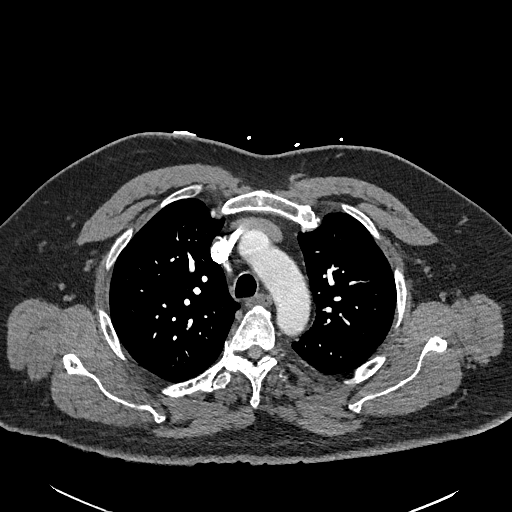
[im 214/260  lung]
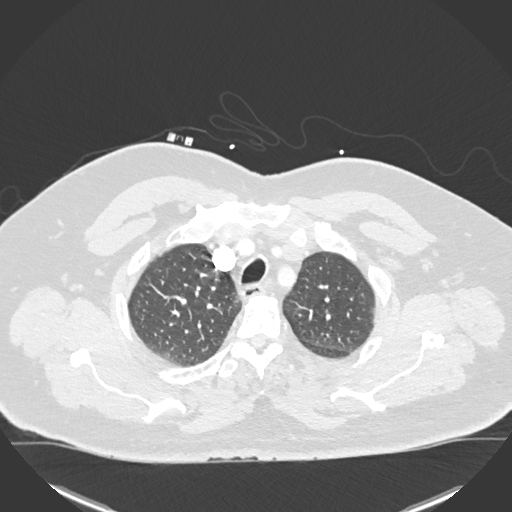
[im 226/260  soft-tissue]
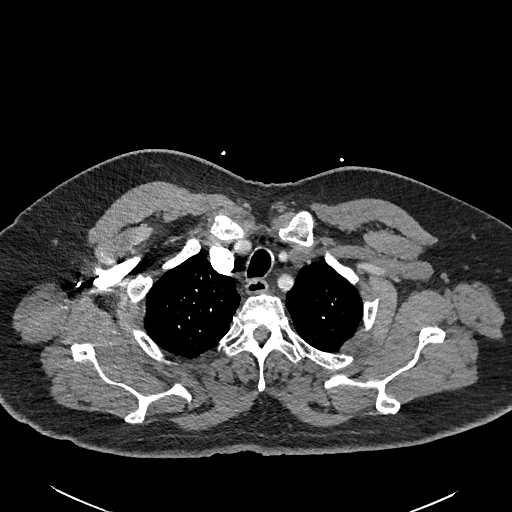
[im 248/260  lung]
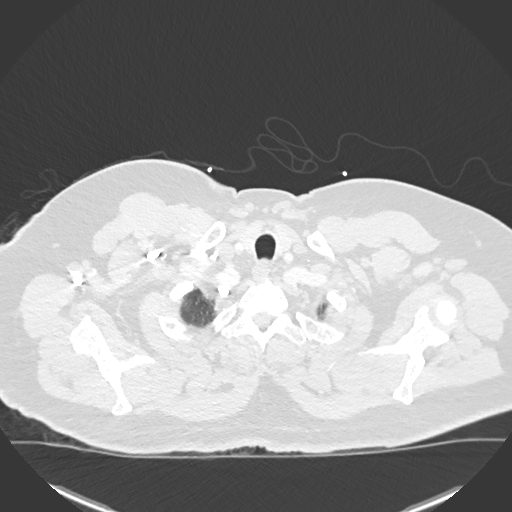

[Series 8: cor soft · coronal · 0.52mm/px · 3 of 151 slices shown]
[im 38/151  soft-tissue]
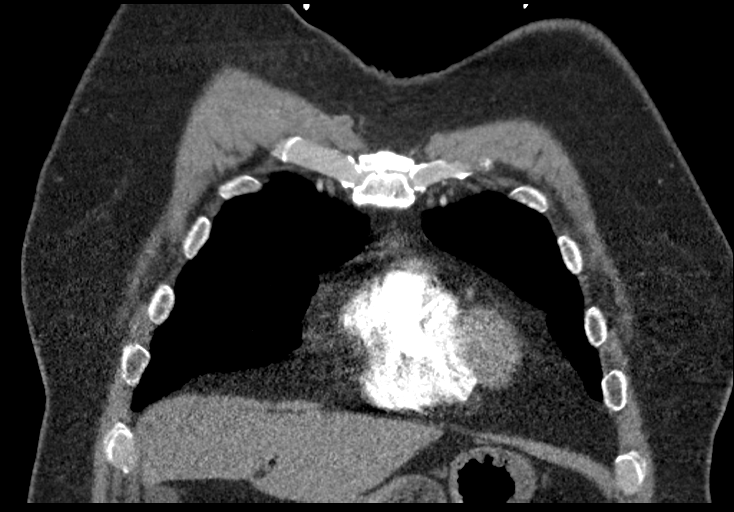
[im 76/151  soft-tissue]
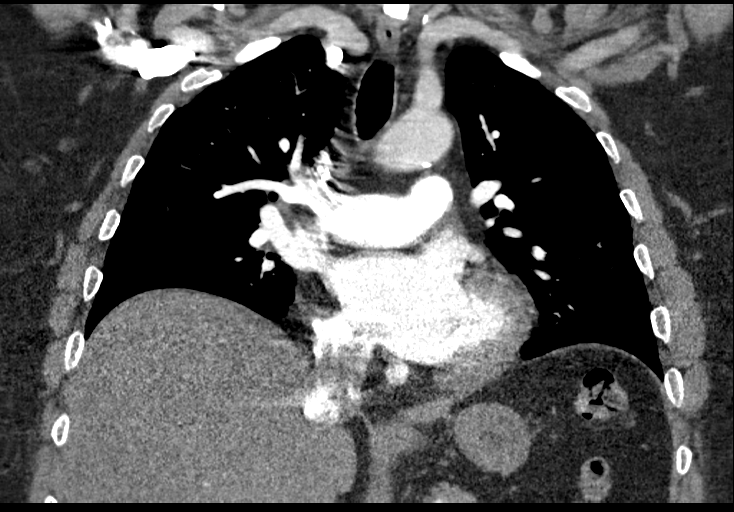
[im 113/151  soft-tissue]
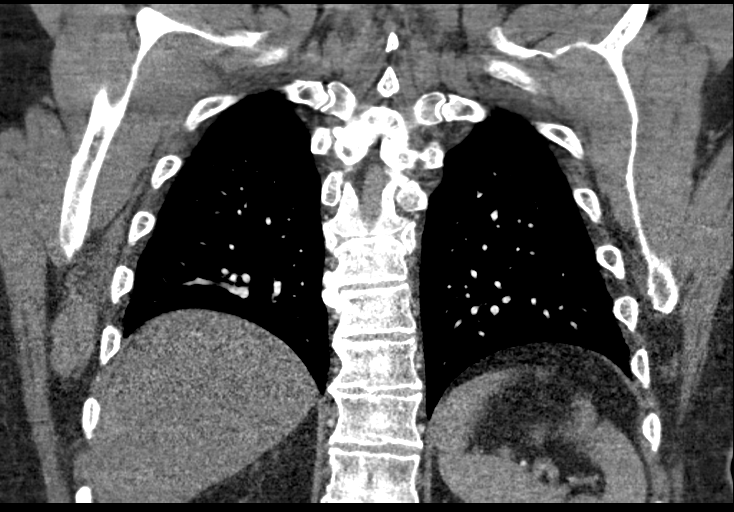

[18 of 46 positions shown; findings below may reference images not displayed]

FINDINGS: Cardiovascular: Satisfactory opacification of the pulmonary arteries
to the segmental level. No evidence of pulmonary embolism. Normal
heart size. No pericardial effusion. Mild atherosclerotic
calcification for age.

Mediastinum/Nodes: No adenopathy or mass.

Lungs/Pleura: Lungs are clear. No pleural effusion or pneumothorax.

Upper Abdomen: Hepatic steatosis with pericholecystic sparing

Musculoskeletal: Spondylosis with multi-level ankylosis. No acute
finding

Review of the MIP images confirms the above findings.
IMPRESSION: 1. Negative for pulmonary embolism or other acute finding.
2. Hepatic steatosis.

## 2020-10-08 DIAGNOSIS — Z1331 Encounter for screening for depression: Secondary | ICD-10-CM | POA: Diagnosis not present

## 2020-10-08 DIAGNOSIS — R351 Nocturia: Secondary | ICD-10-CM | POA: Diagnosis not present

## 2020-10-08 DIAGNOSIS — G894 Chronic pain syndrome: Secondary | ICD-10-CM | POA: Diagnosis not present

## 2020-10-08 DIAGNOSIS — G4730 Sleep apnea, unspecified: Secondary | ICD-10-CM | POA: Diagnosis not present

## 2020-10-08 DIAGNOSIS — Z0001 Encounter for general adult medical examination with abnormal findings: Secondary | ICD-10-CM | POA: Diagnosis not present

## 2020-10-08 DIAGNOSIS — Z1389 Encounter for screening for other disorder: Secondary | ICD-10-CM | POA: Diagnosis not present

## 2020-10-08 DIAGNOSIS — I10 Essential (primary) hypertension: Secondary | ICD-10-CM | POA: Diagnosis not present

## 2020-10-08 DIAGNOSIS — Z6841 Body Mass Index (BMI) 40.0 and over, adult: Secondary | ICD-10-CM | POA: Diagnosis not present

## 2020-10-08 DIAGNOSIS — M1991 Primary osteoarthritis, unspecified site: Secondary | ICD-10-CM | POA: Diagnosis not present

## 2021-02-04 ENCOUNTER — Encounter: Payer: Self-pay | Admitting: Internal Medicine

## 2021-02-19 DIAGNOSIS — H401132 Primary open-angle glaucoma, bilateral, moderate stage: Secondary | ICD-10-CM | POA: Diagnosis not present

## 2021-03-05 DIAGNOSIS — R319 Hematuria, unspecified: Secondary | ICD-10-CM | POA: Diagnosis not present

## 2021-03-05 DIAGNOSIS — Z6841 Body Mass Index (BMI) 40.0 and over, adult: Secondary | ICD-10-CM | POA: Diagnosis not present

## 2021-03-05 DIAGNOSIS — Z1331 Encounter for screening for depression: Secondary | ICD-10-CM | POA: Diagnosis not present

## 2021-03-05 DIAGNOSIS — Z1389 Encounter for screening for other disorder: Secondary | ICD-10-CM | POA: Diagnosis not present

## 2021-04-28 ENCOUNTER — Ambulatory Visit: Payer: Medicare Other | Admitting: Urology

## 2021-04-28 ENCOUNTER — Ambulatory Visit (INDEPENDENT_AMBULATORY_CARE_PROVIDER_SITE_OTHER): Payer: Medicare Other | Admitting: Urology

## 2021-04-28 ENCOUNTER — Encounter: Payer: Self-pay | Admitting: Urology

## 2021-04-28 VITALS — BP 112/73 | HR 69 | Temp 97.4°F | Ht 71.0 in | Wt 275.0 lb

## 2021-04-28 DIAGNOSIS — R310 Gross hematuria: Secondary | ICD-10-CM

## 2021-04-28 LAB — URINALYSIS, ROUTINE W REFLEX MICROSCOPIC
Bilirubin, UA: NEGATIVE
Glucose, UA: NEGATIVE
Ketones, UA: NEGATIVE
Leukocytes,UA: NEGATIVE
Nitrite, UA: NEGATIVE
Specific Gravity, UA: 1.025 (ref 1.005–1.030)
Urobilinogen, Ur: 1 mg/dL (ref 0.2–1.0)
pH, UA: 6.5 (ref 5.0–7.5)

## 2021-04-28 LAB — MICROSCOPIC EXAMINATION
Epithelial Cells (non renal): NONE SEEN /hpf (ref 0–10)
RBC, Urine: >30 /hpf — AB (ref 0–2)
Renal Epithel, UA: NONE SEEN /hpf
WBC, UA: NONE SEEN /hpf (ref 0–5)

## 2021-04-28 LAB — BLADDER SCAN AMB NON-IMAGING: Scan Result: 3

## 2021-04-28 NOTE — Progress Notes (Signed)
Timothy Mcdaniel   04/28/2021 11:36 AM   Timothy Mcdaniel Sep 19, 1946 [id number]  Referring provider: Cory Munch, PA-C [place],  [place] [address]  CC: gross hematuria   HPI:  New pt -   1) gross hematuria - pt noted a reddish color to his urine without clots around Mar 2022. No pain, fever or dysuria. No GU hx or surgery. No h/o BPH. No radiation or chemo. No chemical/dye exposure. He smoked for about 30 yrs and quit 25 yrs ago.   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   UA today with > 30 rbcs/hpf.   PMH: Past Medical History:  Diagnosis Date  . Arthritis    OA- both knees , L shoulder   . Exogenous obesity   . Hypercholesteremia   . Hypertension   . Nonischemic cardiomyopathy ([place])   . OA (osteoarthritis)   . OSA on CPAP   . Recurrent upper respiratory infection (URI)    DEC. had flu shot- followed by bad cold, treated /w OTC  . Shortness of breath   . Stroke [facility])    ministroke    Surgical History: Past Surgical History:  Procedure Laterality Date  . COLONOSCOPY N/A 02/09/2014   Procedure: COLONOSCOPY;  Surgeon: Daneil Dolin, MD;  [facility];  Service: Endoscopy;  Laterality: N/A;  11:30 AM  . MANDIBLE FRACTURE SURGERY     1960's - occupational injury  . TOTAL KNEE ARTHROPLASTY  02/15/2012   Procedure: TOTAL KNEE ARTHROPLASTY;  Surgeon: Rudean Haskell, MD;  Location: [facility];  Service: Orthopedics;  Laterality: Left;  . TRANSTHORACIC ECHOCARDIOGRAM  08/03/2011   EF 50%; LV systolic funtion mildly reduced; trace MR, mild TR    Home Medications:  Allergies as of 04/28/2021   No Known Allergies     Medication List       Accurate as of Apr 28, 2021 11:36 AM. If you have any questions, ask your nurse or doctor.        aspirin 81 MG tablet Take 81 mg by mouth daily.   brimonidine 0.15 % ophthalmic solution Commonly known as: ALPHAGAN Place 1 drop into both eyes every morning.   CENTRUM  SILVER ADULT 50+ PO Take 1 tablet by mouth daily.   fenofibrate 48 MG tablet Commonly known as: TRICOR TAKE ONE TABLET BY MOUTH ONCE DAILY   Flaxseed Oil Max Str 1300 MG Caps Take 1 capsule by mouth 3 (three) times daily.   hydrochlorothiazide 25 MG tablet Commonly known as: HYDRODIURIL Take 25 mg by mouth daily.   KRILL OIL OMEGA-3 PO Take 350 mg by mouth daily.   latanoprost 0.005 % ophthalmic solution Commonly known as: XALATAN Place 1 drop into both eyes at bedtime.   lisinopril 40 MG tablet Commonly known as: ZESTRIL Take 40 mg by mouth daily.       Allergies: No Known Allergies  Family History: Family History  Problem Relation Age of Onset  . Heart attack Mother   . Cancer Father   . Kidney disease Brother   . Anesthesia problems Neg Hx   . Hypotension Neg Hx   . Malignant hyperthermia Neg Hx   . Pseudochol deficiency Neg Hx   . Colon cancer Neg Hx     Social History:  reports that he quit smoking about 27 years ago. His smoking use included cigarettes. He has a 14.00 pack-year smoking history. He has never used smokeless tobacco. He reports that  he does not drink alcohol and does not use drugs.   Physical Exam: There were no vitals taken for this visit.  Constitutional:  Alert and oriented, No acute distress. HEENT: [place] AT, moist mucus membranes.  Trachea midline, no masses. Cardiovascular: No clubbing, cyanosis, or edema. Respiratory: Normal respiratory effort, no increased work of breathing. GI: Abdomen is soft, nontender, nondistended, no abdominal masses GU: No CVA tenderness Lymph: No cervical or inguinal lymphadenopathy. Skin: No rashes, bruises or suspicious lesions. Neurologic: Grossly intact, no focal deficits, moving all 4 extremities. Psychiatric: Normal mood and affect. GU: Penis circumcised, normal foreskin, testicles descended bilaterally and palpably normal, bilateral epididymis palpably normal, scrotum normal, no inguinal hernia  DRE:  Prostate 30 g, smooth without hard area or nodule   Laboratory Data: Lab Results  Component Value Date   WBC 7.3 10/10/2019   HGB 13.4 10/10/2019   HCT 41.9 10/10/2019   MCV 92.3 10/10/2019   PLT 282 10/10/2019    Lab Results  Component Value Date   CREATININE 0.93 10/10/2019    No results found for: PSA  No results found for: TESTOSTERONE  No results found for: HGBA1C  Urinalysis    Component Value Date/Time   COLORURINE YELLOW 02/04/2012 [place] 02/04/2012 1221   LABSPEC 1.017 02/04/2012 1221   PHURINE 8.0 02/04/2012 1221   GLUCOSEU NEGATIVE 02/04/2012 1221   HGBUR NEGATIVE 02/04/2012 1221   BILIRUBINUR NEGATIVE 02/04/2012 1221   KETONESUR NEGATIVE 02/04/2012 1221   PROTEINUR NEGATIVE 02/04/2012 1221   UROBILINOGEN 1.0 02/04/2012 1221   NITRITE NEGATIVE 02/04/2012 1221   LEUKOCYTESUR NEGATIVE 02/04/2012 1221    No results found for: LABMICR, WBCUA, RBCUA, LABEPIT, MUCUS, BACTERIA     Assessment & Plan:    1. Gross hematuria WIll start with a non-con CT given the nationwide contrast shortage. He will return for cystoscopy.  His DRE if benign. I sent a PSA .   - BLADDER SCAN AMB NON-IMAGING - Urinalysis, Routine w reflex microscopic   No follow-ups on file.  [name], MD

## 2021-04-28 NOTE — Patient Instructions (Signed)
Cystoscopy Cystoscopy is a procedure that is used to help diagnose and sometimes treat conditions that affect the lower urinary tract. The lower urinary tract includes the bladder and the urethra. The urethra is the tube that drains urine from the bladder. Cystoscopy is done using a thin, tube-shaped instrument with a light and camera at the end (cystoscope). The cystoscope may be hard or flexible, depending on the goal of the procedure. The cystoscope is inserted through the urethra, into the bladder. Cystoscopy may be recommended if you have:  Urinary tract infections that keep coming back.  Blood in the urine (hematuria).  An inability to control when you urinate (urinary incontinence) or an overactive bladder.  Unusual cells found in a urine sample.  A blockage in the urethra, such as a urinary stone.  Painful urination.  An abnormality in the bladder found during an intravenous pyelogram (IVP) or CT scan. Cystoscopy may also be done to remove a sample of tissue to be examined under a microscope (biopsy). Tell a health care provider about:  Any allergies you have.  All medicines you are taking, including vitamins, herbs, eye drops, creams, and over-the-counter medicines.  Any problems you or family members have had with anesthetic medicines.  Any blood disorders you have.  Any surgeries you have had.  Any medical conditions you have.  Whether you are pregnant or may be pregnant. What are the risks? Generally, this is a safe procedure. However, problems may occur, including:  Infection.  Bleeding.  Allergic reactions to medicines.  Damage to other structures or organs. What happens before the procedure? Medicines Ask your health care provider about:  Changing or stopping your regular medicines. This is especially important if you are taking diabetes medicines or blood thinners.  Taking medicines such as aspirin and ibuprofen. These medicines can thin your blood. Do  not take these medicines unless your health care provider tells you to take them.  Taking over-the-counter medicines, vitamins, herbs, and supplements. Tests You may have an exam or testing, such as:  X-rays of the bladder, urethra, or kidneys.  CT scan of the abdomen or pelvis.  Urine tests to check for signs of infection. General instructions  Follow instructions from your health care provider about eating or drinking restrictions.  Ask your health care provider what steps will be taken to help prevent infection. These steps may include: ? Washing skin with a germ-killing soap. ? Taking antibiotic medicine.  Plan to have a responsible adult take you home from the hospital or clinic. What happens during the procedure?  You will be given one or more of the following: ? A medicine to help you relax (sedative). ? A medicine to numb the area (local anesthetic).  The area around the opening of your urethra will be cleaned.  The cystoscope will be passed through your urethra into your bladder.  Germ-free (sterile) fluid will flow through the cystoscope to fill your bladder. The fluid will stretch your bladder so that your health care provider can clearly examine your bladder walls.  Your doctor will look at the urethra and bladder. Your doctor may take a biopsy or remove stones.  The cystoscope will be removed, and your bladder will be emptied. The procedure may vary among health care providers and hospitals.   What can I expect after the procedure? After the procedure, it is common to have:  Some soreness or pain in your abdomen and urethra.  Urinary symptoms. These include: ? Mild pain or burning  when you urinate. Pain should stop within a few minutes after you urinate. This may last for up to 1 week. ? A small amount of blood in your urine for several days. ? Feeling like you need to urinate but producing only a small amount of urine. Follow these instructions at  home: Medicines  Take over-the-counter and prescription medicines only as told by your health care provider.  If you were prescribed an antibiotic medicine, take it as told by your health care provider. Do not stop taking the antibiotic even if you start to feel better. General instructions  Return to your normal activities as told by your health care provider. Ask your health care provider what activities are safe for you.  If you were given a sedative during the procedure, it can affect you for several hours. Do not drive or operate machinery until your health care provider says that it is safe.  Watch for any blood in your urine. If the amount of blood in your urine increases, call your health care provider.  Follow instructions from your health care provider about eating or drinking restrictions.  If a tissue sample was removed for testing (biopsy) during your procedure, it is up to you to get your test results. Ask your health care provider, or the department that is doing the test, when your results will be ready.  Drink enough fluid to keep your urine pale yellow.  Keep all follow-up visits. This is important. Contact a health care provider if:  You have pain that gets worse or does not get better with medicine, especially pain when you urinate.  You have trouble urinating.  You have more blood in your urine. Get help right away if:  You have blood clots in your urine.  You have abdominal pain.  You have a fever or chills.  You are unable to urinate. Summary  Cystoscopy is a procedure that is used to help diagnose and sometimes treat conditions that affect the lower urinary tract.  Cystoscopy is done using a thin, tube-shaped instrument with a light and camera at the end.  After the procedure, it is common to have some soreness or pain in your abdomen and urethra.  Watch for any blood in your urine. If the amount of blood in your urine increases, call your health  care provider.  If you were prescribed an antibiotic medicine, take it as told by your health care provider. Do not stop taking the antibiotic even if you start to feel better. This information is not intended to replace advice given to you by your health care provider. Make sure you discuss any questions you have with your health care provider. Document Revised: 07/12/2020 Document Reviewed: 07/12/2020 Elsevier Patient Education  [place].

## 2021-04-28 NOTE — Progress Notes (Signed)
post void residual=3  Urological Symptom Review  Patient is experiencing the following symptoms: Frequent urination Hard to postpone urination Get up at night to urinate Leakage of urine Stream starts and stops Blood in urine Weak stream Erection problems (male only)   Review of Systems  Gastrointestinal (upper)  : Negative for upper GI symptoms  Gastrointestinal (lower) : Constipation  Constitutional : Negative for symptoms  Skin: Negative for skin symptoms  Eyes: Negative for eye symptoms  Ear/Nose/Throat : Sinus problems  Hematologic/Lymphatic: Negative for Hematologic/Lymphatic symptoms  Cardiovascular : Negative for cardiovascular symptoms  Respiratory : Negative for respiratory symptoms  Endocrine: Negative for endocrine symptoms  Musculoskeletal: Joint pain  Neurological: Headaches  Psychologic: Negative for psychiatric symptoms

## 2021-04-29 LAB — PSA: Prostate Specific Ag, Serum: 0.2 ng/mL (ref 0.0–4.0)

## 2021-05-23 ENCOUNTER — Ambulatory Visit (HOSPITAL_COMMUNITY)
Admission: RE | Admit: 2021-05-23 | Discharge: 2021-05-23 | Disposition: A | Discharge status: Home / Self Care | Payer: Medicare Other | Source: Ambulatory Visit | Attending: Urology | Admitting: Urology

## 2021-05-23 ENCOUNTER — Other Ambulatory Visit: Payer: Self-pay

## 2021-05-23 DIAGNOSIS — R310 Gross hematuria: Secondary | ICD-10-CM | POA: Diagnosis not present

## 2021-05-23 DIAGNOSIS — R319 Hematuria, unspecified: Secondary | ICD-10-CM | POA: Diagnosis not present

## 2021-05-23 DIAGNOSIS — Q644 Malformation of urachus: Secondary | ICD-10-CM | POA: Diagnosis not present

## 2021-05-23 DIAGNOSIS — N281 Cyst of kidney, acquired: Secondary | ICD-10-CM | POA: Diagnosis not present

## 2021-05-23 DIAGNOSIS — N323 Diverticulum of bladder: Secondary | ICD-10-CM | POA: Diagnosis not present

## 2021-05-30 ENCOUNTER — Telehealth: Payer: Self-pay

## 2021-05-30 NOTE — Telephone Encounter (Signed)
-----   Message from [name], MD sent at 05/29/2021  3:05 PM EDT ----- Let Timothy Mcdaniel know his CT showed a growth or polyp in his bladder so be sure to follow-up for cystoscopy as planned so I can take a look and see if it needs a biopsy. Thanks - Dr Johnette Abraham   ----- Message ----- From: Iris Pert, LPN Sent: 0/06/6225   8:22 AM EDT To: [name], MD  Please review

## 2021-05-30 NOTE — Telephone Encounter (Signed)
Patient called and made aware.

## 2021-06-30 ENCOUNTER — Other Ambulatory Visit: Payer: Self-pay

## 2021-06-30 ENCOUNTER — Ambulatory Visit (INDEPENDENT_AMBULATORY_CARE_PROVIDER_SITE_OTHER): Payer: Medicare Other | Admitting: Urology

## 2021-06-30 VITALS — BP 113/69 | HR 69

## 2021-06-30 DIAGNOSIS — R310 Gross hematuria: Secondary | ICD-10-CM | POA: Diagnosis not present

## 2021-06-30 DIAGNOSIS — D414 Neoplasm of uncertain behavior of bladder: Secondary | ICD-10-CM | POA: Diagnosis not present

## 2021-06-30 LAB — MICROSCOPIC EXAMINATION
Bacteria, UA: NONE SEEN
Epithelial Cells (non renal): NONE SEEN /hpf (ref 0–10)
Renal Epithel, UA: NONE SEEN /hpf
WBC, UA: NONE SEEN /hpf (ref 0–5)

## 2021-06-30 LAB — URINALYSIS, ROUTINE W REFLEX MICROSCOPIC
Bilirubin, UA: NEGATIVE
Glucose, UA: NEGATIVE
Ketones, UA: NEGATIVE
Leukocytes,UA: NEGATIVE
Nitrite, UA: NEGATIVE
Specific Gravity, UA: 1.02 (ref 1.005–1.030)
Urobilinogen, Ur: 0.2 mg/dL (ref 0.2–1.0)
pH, UA: 7 (ref 5.0–7.5)

## 2021-06-30 MED ORDER — CIPROFLOXACIN HCL 500 MG PO TABS
500.0000 mg | ORAL_TABLET | Freq: Once | ORAL | Status: AC
  Administered 2021-06-30: 500 mg via ORAL

## 2021-06-30 NOTE — Progress Notes (Signed)
   06/30/21  CC: gross hematuria   HPI:  1) gross hematuria - pt noted a reddish color to his urine without clots around Mar 2022. No pain, fever or dysuria. No GU hx or surgery. No h/o BPH. No radiation or chemo. No chemical/dye exposure. He smoked for about 30 yrs and quit 25 yrs ago. UA with > 30 rbcs/hpf. CT non-con (contrast shortage) A/P 05/23/2021 with a papillary calcified lesion of 19 mm left bladder base. No LAD, bone lesions or hydro. I reviewed images.    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    Blood pressure 113/69, pulse 69. NED. A&Ox3.   No respiratory distress   Abd soft, NT, ND Normal phallus with bilateral descended testicles  Cystoscopy Procedure Note  Patient identification was confirmed, informed consent was obtained, and patient was prepped using Betadine solution.  Lidocaine jelly was administered per urethral meatus.     Pre-Procedure: - Inspection reveals a normal caliber ureteral meatus.  Procedure: The flexible cystoscope was introduced without difficulty - No urethral strictures/lesions are present. - lateral lobe hypertrophy prostate borderline obs  - patent bladder neck - Bilateral ureteral orifices identified - Bladder mucosa  reveals a small papillary lesion at the dome and the 2 cm papillary lesion left base.  - No bladder stones - No trabeculation  Retroflexion shows papillary lesion   Post-Procedure: - Patient tolerated the procedure well  Assessment/ Plan:  Bladder neoplasm -  discussed with patient the CT and cysto findings (viewed on monitor) and the nature, potential benefits, risks and alternatives to TURBT with post-op instillation of gemcitabine, including side effects of the proposed treatment, the likelihood of the patient achieving the goals of the procedure, and any potential problems that might occur during the procedure or recuperation. All questions answered. Patient elects to proceed.    No  follow-ups on file.  [name], MD

## 2021-06-30 NOTE — Progress Notes (Signed)
Urological Symptom Review  Patient is experiencing the following symptoms: Frequent urination Hard to postpone urination Get up at night to urinate Leakage of urine Blood in urine Erection problems (male only)   Review of Systems  Gastrointestinal (upper)  : Negative for upper GI symptoms  Gastrointestinal (lower) : Negative for lower GI symptoms  Constitutional : Negative for symptoms  Skin: Negative for skin symptoms  Eyes: Negative for eye symptoms  Ear/Nose/Throat : Negative for Ear/Nose/Throat symptoms  Hematologic/Lymphatic: Negative for Hematologic/Lymphatic symptoms  Cardiovascular : Negative for cardiovascular symptoms  Respiratory : Negative for respiratory symptoms  Endocrine: Negative for endocrine symptoms  Musculoskeletal: Joint pain  Neurological: Negative for neurological symptoms  Psychologic: Negative for psychiatric symptoms

## 2021-07-23 ENCOUNTER — Other Ambulatory Visit: Payer: Self-pay | Admitting: Urology

## 2021-08-01 NOTE — Progress Notes (Signed)
DUE TO COVID-19 ONLY ONE VISITOR IS ALLOWED TO COME WITH YOU AND STAY IN THE WAITING ROOM ONLY DURING PRE OP AND PROCEDURE DAY OF SURGERY. THE 1 VISITOR  MAY VISIT WITH YOU AFTER SURGERY IN YOUR PRIVATE ROOM DURING VISITING HOURS ONLY!  YOU NEED TO HAVE A COVID 19 TEST ON_______ '@_______'$ , THIS TEST MUST BE DONE BEFORE SURGERY,  COVID TESTING SITE [address] [place] [address], IT IS ON THE RIGHT GOING OUT [address] APPROXIMATELY  2 MINUTES PAST [employer] ON THE RIGHT. ONCE YOUR COVID TEST IS COMPLETED,  PLEASE BEGIN THE QUARANTINE INSTRUCTIONS AS OUTLINED IN YOUR HANDOUT.                Timothy Mcdaniel  08/01/2021   Your procedure is scheduled on:  08/12/2021   Report to [facility] Main  Entrance   Report to admitting at    1000AM     Call this number if you have problems the morning of surgery [phone]    Remember: Do not eat food , candy gum or mints :After Midnight. You may have clear liquids from midnight until   0915am    CLEAR LIQUID DIET   Foods Allowed                                                                       Coffee and tea, regular and decaf                              Plain Jell-O any favor except red or purple                                            Fruit ices (not with fruit pulp)                                      Iced Popsicles                                     Carbonated beverages, regular and diet                                    Cranberry, grape and apple juices Sports drinks like Gatorade Lightly seasoned clear broth or consume(fat free) Sugar, honey syrup   _____________________________________________________________________    BRUSH YOUR TEETH MORNING OF SURGERY AND RINSE YOUR MOUTH OUT, NO CHEWING GUM CANDY OR MINTS.     Take these medicines the morning of surgery with A SIP OF WATER:  eye drops as usual   DO NOT TAKE ANY DIABETIC MEDICATIONS DAY OF YOUR SURGERY                                You may not have any  metal on your body including hair pins and              piercings  Do not wear jewelry, make-up, lotions, powders or perfumes, deodorant             Do not wear nail polish on your fingernails.  Do not shave  48 hours prior to surgery.              Men may shave face and neck.   Do not bring valuables to the hospital. [place].  Contacts, dentures or bridgework may not be worn into surgery.  Leave suitcase in the car. After surgery it may be brought to your room.     Patients discharged the day of surgery will not be allowed to drive home. IF YOU ARE HAVING SURGERY AND GOING HOME THE SAME DAY, YOU MUST HAVE AN ADULT TO DRIVE YOU HOME AND BE WITH YOU FOR 24 HOURS. YOU MAY GO HOME BY TAXI OR UBER OR ORTHERWISE, BUT AN ADULT MUST ACCOMPANY YOU HOME AND STAY WITH YOU FOR 24 HOURS.  Name and phone number of your driver:  Special Instructions: N/A              Please read over the following fact sheets you were given: _____________________________________________________________________  [facility] - Preparing for Surgery Before surgery, you can play an important role.  Because skin is not sterile, your skin needs to be as free of germs as possible.  You can reduce the number of germs on your skin by washing with CHG (chlorahexidine gluconate) soap before surgery.  CHG is an antiseptic cleaner which kills germs and bonds with the skin to continue killing germs even after washing. Please DO NOT use if you have an allergy to CHG or antibacterial soaps.  If your skin becomes reddened/irritated stop using the CHG and inform your nurse when you arrive at Short Stay. Do not shave (including legs and underarms) for at least 48 hours prior to the first CHG shower.  You may shave your face/neck. Please follow these instructions carefully:  1.  Shower with CHG Soap the night before surgery and the  morning of Surgery.  2.  If you  choose to wash your hair, wash your hair first as usual with your  normal  shampoo.  3.  After you shampoo, rinse your hair and body thoroughly to remove the  shampoo.                           4.  Use CHG as you would any other liquid soap.  You can apply chg directly  to the skin and wash                       Gently with a scrungie or clean washcloth.  5.  Apply the CHG Soap to your body ONLY FROM THE NECK DOWN.   Do not use on face/ open                           Wound or open sores. Avoid contact with eyes, ears mouth and genitals (private parts).                       Wash face,  Genitals (private parts) with your normal soap.             6.  Wash thoroughly, paying special attention to the area where your surgery  will be performed.  7.  Thoroughly rinse your body with warm water from the neck down.  8.  DO NOT shower/wash with your normal soap after using and rinsing off  the CHG Soap.                9.  Pat yourself dry with a clean towel.            10.  Wear clean pajamas.            11.  Place clean sheets on your bed the night of your first shower and do not  sleep with pets. Day of Surgery : Do not apply any lotions/deodorants the morning of surgery.  Please wear clean clothes to the hospital/surgery center.  FAILURE TO FOLLOW THESE INSTRUCTIONS MAY RESULT IN THE CANCELLATION OF YOUR SURGERY PATIENT SIGNATURE_________________________________  NURSE SIGNATURE__________________________________  ________________________________________________________________________

## 2021-08-05 ENCOUNTER — Encounter (HOSPITAL_COMMUNITY)
Admission: RE | Admit: 2021-08-05 | Discharge: 2021-08-05 | Disposition: A | Discharge status: Home / Self Care | Payer: Medicare Other | Source: Ambulatory Visit | Attending: Urology | Admitting: Urology

## 2021-08-05 ENCOUNTER — Other Ambulatory Visit: Payer: Self-pay

## 2021-08-05 ENCOUNTER — Encounter (HOSPITAL_COMMUNITY): Payer: Self-pay

## 2021-08-05 DIAGNOSIS — I447 Left bundle-branch block, unspecified: Secondary | ICD-10-CM | POA: Insufficient documentation

## 2021-08-05 DIAGNOSIS — Z01818 Encounter for other preprocedural examination: Secondary | ICD-10-CM | POA: Insufficient documentation

## 2021-08-05 DIAGNOSIS — Z961 Presence of intraocular lens: Secondary | ICD-10-CM | POA: Diagnosis not present

## 2021-08-05 DIAGNOSIS — H401132 Primary open-angle glaucoma, bilateral, moderate stage: Secondary | ICD-10-CM | POA: Diagnosis not present

## 2021-08-05 LAB — BASIC METABOLIC PANEL
Anion gap: 12 (ref 5–15)
BUN: 22 mg/dL (ref 8–23)
CO2: 23 mmol/L (ref 22–32)
Calcium: 9.6 mg/dL (ref 8.9–10.3)
Chloride: 104 mmol/L (ref 98–111)
Creatinine, Ser: 0.95 mg/dL (ref 0.61–1.24)
GFR, Estimated: >60 mL/min (ref >60)
Glucose, Bld: 124 mg/dL — ABNORMAL HIGH (ref 70–99)
Potassium: 4 mmol/L (ref 3.5–5.1)
Sodium: 139 mmol/L (ref 135–145)

## 2021-08-05 LAB — CBC
HCT: 44 % (ref 39.0–52.0)
Hemoglobin: 14.2 g/dL (ref 13.0–17.0)
MCH: 30.3 pg (ref 26.0–34.0)
MCHC: 32.3 g/dL (ref 30.0–36.0)
MCV: 93.8 fL (ref 80.0–100.0)
Platelets: 249 10*3/uL (ref 150–400)
RBC: 4.69 MIL/uL (ref 4.22–5.81)
RDW: 14.1 % (ref 11.5–15.5)
WBC: 5.8 10*3/uL (ref 4.0–10.5)
nRBC: 0 % (ref 0.0–0.2)

## 2021-08-05 NOTE — Progress Notes (Addendum)
        Anesthesia Review:  PCP:  DR [employer] [phone] Requested LOV note.  Cardiologist : Chest x-ray : EKG : 08/05/21 Echo :2017 Stress test:2017  Cardiac Cath :  Activity level:  Sleep Study/ CPAP : Fasting Blood Sugar :      / Checks Blood Sugar -- times a day:   Blood Thinner/ Instructions /Last Dose: ASA / Instructions/ Last Dose :   NO covid test required.  Mini stroke was 25 plus years ago with no deficits .

## 2021-08-12 ENCOUNTER — Ambulatory Visit (HOSPITAL_COMMUNITY)
Admission: RE | Admit: 2021-08-12 | Discharge: 2021-08-12 | Disposition: A | Discharge status: Home / Self Care | Payer: Medicare Other | Attending: Urology | Admitting: Urology

## 2021-08-12 ENCOUNTER — Ambulatory Visit (HOSPITAL_COMMUNITY): Payer: Medicare Other | Admitting: Physician Assistant

## 2021-08-12 ENCOUNTER — Encounter (HOSPITAL_COMMUNITY)
Admission: RE | Disposition: A | Discharge status: Home / Self Care | Payer: Self-pay | Source: Home / Self Care | Attending: Urology

## 2021-08-12 ENCOUNTER — Ambulatory Visit (HOSPITAL_COMMUNITY): Payer: Medicare Other | Admitting: Certified Registered"

## 2021-08-12 ENCOUNTER — Encounter (HOSPITAL_COMMUNITY): Payer: Self-pay | Admitting: Urology

## 2021-08-12 DIAGNOSIS — C674 Malignant neoplasm of posterior wall of bladder: Secondary | ICD-10-CM | POA: Insufficient documentation

## 2021-08-12 DIAGNOSIS — Z79899 Other long term (current) drug therapy: Secondary | ICD-10-CM | POA: Insufficient documentation

## 2021-08-12 DIAGNOSIS — C671 Malignant neoplasm of dome of bladder: Secondary | ICD-10-CM | POA: Insufficient documentation

## 2021-08-12 DIAGNOSIS — C678 Malignant neoplasm of overlapping sites of bladder: Secondary | ICD-10-CM | POA: Diagnosis not present

## 2021-08-12 DIAGNOSIS — D414 Neoplasm of uncertain behavior of bladder: Secondary | ICD-10-CM | POA: Diagnosis not present

## 2021-08-12 DIAGNOSIS — Z87891 Personal history of nicotine dependence: Secondary | ICD-10-CM | POA: Diagnosis not present

## 2021-08-12 DIAGNOSIS — Z9989 Dependence on other enabling machines and devices: Secondary | ICD-10-CM | POA: Diagnosis not present

## 2021-08-12 DIAGNOSIS — G4733 Obstructive sleep apnea (adult) (pediatric): Secondary | ICD-10-CM | POA: Diagnosis not present

## 2021-08-12 DIAGNOSIS — I10 Essential (primary) hypertension: Secondary | ICD-10-CM | POA: Diagnosis not present

## 2021-08-12 HISTORY — PX: TRANSURETHRAL RESECTION OF BLADDER TUMOR: SHX2575

## 2021-08-12 SURGERY — TURBT (TRANSURETHRAL RESECTION OF BLADDER TUMOR)
Anesthesia: General | Site: Bladder

## 2021-08-12 MED ORDER — PHENYLEPHRINE HCL (PRESSORS) 10 MG/ML IV SOLN
INTRAVENOUS | Status: DC | PRN
  Administered 2021-08-12 (×2): 40 ug via INTRAVENOUS

## 2021-08-12 MED ORDER — FENTANYL CITRATE (PF) 100 MCG/2ML IJ SOLN
INTRAMUSCULAR | Status: AC
  Filled 2021-08-12: qty 2

## 2021-08-12 MED ORDER — FENTANYL CITRATE (PF) 250 MCG/5ML IJ SOLN
INTRAMUSCULAR | Status: DC | PRN
  Administered 2021-08-12 (×2): 50 ug via INTRAVENOUS

## 2021-08-12 MED ORDER — SUGAMMADEX SODIUM 200 MG/2ML IV SOLN
INTRAVENOUS | Status: DC | PRN
  Administered 2021-08-12: 265 mg via INTRAVENOUS

## 2021-08-12 MED ORDER — ONDANSETRON HCL 4 MG/2ML IJ SOLN
INTRAMUSCULAR | Status: AC
  Filled 2021-08-12: qty 2

## 2021-08-12 MED ORDER — PROPOFOL 10 MG/ML IV BOLUS
INTRAVENOUS | Status: AC
  Filled 2021-08-12: qty 40

## 2021-08-12 MED ORDER — ROCURONIUM BROMIDE 10 MG/ML (PF) SYRINGE
PREFILLED_SYRINGE | INTRAVENOUS | Status: DC | PRN
  Administered 2021-08-12: 50 mg via INTRAVENOUS

## 2021-08-12 MED ORDER — GEMCITABINE CHEMO FOR BLADDER INSTILLATION 2000 MG
2000.0000 mg | Freq: Once | INTRAVENOUS | Status: AC
  Administered 2021-08-12: 2000 mg via INTRAVESICAL
  Filled 2021-08-12: qty 2000

## 2021-08-12 MED ORDER — CHLORHEXIDINE GLUCONATE 0.12 % MT SOLN
15.0000 mL | Freq: Once | OROMUCOSAL | Status: AC
  Administered 2021-08-12: 15 mL via OROMUCOSAL

## 2021-08-12 MED ORDER — PROPOFOL 10 MG/ML IV BOLUS
INTRAVENOUS | Status: DC | PRN
  Administered 2021-08-12: 160 mg via INTRAVENOUS

## 2021-08-12 MED ORDER — ORAL CARE MOUTH RINSE: 15.0000 mL | Freq: Once | OROMUCOSAL | Status: AC

## 2021-08-12 MED ORDER — NITROFURANTOIN MONOHYD MACRO 100 MG PO CAPS: 100.0000 mg | ORAL_CAPSULE | Freq: Every day | ORAL | 0 refills | Status: AC

## 2021-08-12 MED ORDER — SUGAMMADEX SODIUM 500 MG/5ML IV SOLN
INTRAVENOUS | Status: AC
  Filled 2021-08-12: qty 5

## 2021-08-12 MED ORDER — LIDOCAINE 2% (20 MG/ML) 5 ML SYRINGE
INTRAMUSCULAR | Status: AC
  Filled 2021-08-12: qty 5

## 2021-08-12 MED ORDER — LACTATED RINGERS IV SOLN: INTRAVENOUS | Status: DC

## 2021-08-12 MED ORDER — DEXAMETHASONE SODIUM PHOSPHATE 10 MG/ML IJ SOLN
INTRAMUSCULAR | Status: DC | PRN
  Administered 2021-08-12: 4 mg via INTRAVENOUS

## 2021-08-12 MED ORDER — PHENYLEPHRINE 40 MCG/ML (10ML) SYRINGE FOR IV PUSH (FOR BLOOD PRESSURE SUPPORT)
PREFILLED_SYRINGE | INTRAVENOUS | Status: AC
  Filled 2021-08-12: qty 10

## 2021-08-12 MED ORDER — CEFAZOLIN IN SODIUM CHLORIDE 3-0.9 GM/100ML-% IV SOLN
INTRAVENOUS | Status: AC
  Filled 2021-08-12: qty 100

## 2021-08-12 MED ORDER — CEFAZOLIN IN SODIUM CHLORIDE 3-0.9 GM/100ML-% IV SOLN
3.0000 g | Freq: Once | INTRAVENOUS | Status: AC
  Administered 2021-08-12: 3 g via INTRAVENOUS

## 2021-08-12 MED ORDER — STERILE WATER FOR IRRIGATION IR SOLN
Status: DC | PRN
  Administered 2021-08-12: 500 mL

## 2021-08-12 MED ORDER — SODIUM CHLORIDE 0.9 % IR SOLN
Status: DC | PRN
  Administered 2021-08-12: 12000 mL

## 2021-08-12 MED ORDER — LIDOCAINE 2% (20 MG/ML) 5 ML SYRINGE
INTRAMUSCULAR | Status: DC | PRN
  Administered 2021-08-12: 100 mg via INTRAVENOUS

## 2021-08-12 MED ORDER — ONDANSETRON HCL 4 MG/2ML IJ SOLN
INTRAMUSCULAR | Status: DC | PRN
  Administered 2021-08-12: 4 mg via INTRAVENOUS

## 2021-08-12 MED ORDER — ROCURONIUM BROMIDE 10 MG/ML (PF) SYRINGE
PREFILLED_SYRINGE | INTRAVENOUS | Status: AC
  Filled 2021-08-12: qty 10

## 2021-08-12 SURGICAL SUPPLY — 17 items
BAG URINE DRAIN 2000ML AR STRL (UROLOGICAL SUPPLIES) IMPLANT
BAG URO CATCHER STRL LF (MISCELLANEOUS) ×2 IMPLANT
CATH FOLEY 2WAY SLVR  5CC 18FR (CATHETERS) ×2
CATH FOLEY 2WAY SLVR 5CC 18FR (CATHETERS) ×1 IMPLANT
DRAPE FOOT SWITCH (DRAPES) ×2 IMPLANT
GLOVE SURG ENC TEXT LTX SZ7.5 (GLOVE) ×2 IMPLANT
GLOVE SURG UNDER POLY LF SZ6.5 (GLOVE) ×4 IMPLANT
GLOVE SURG UNDER POLY LF SZ7.5 (GLOVE) ×2 IMPLANT
GOWN STRL REUS W/TWL LRG LVL3 (GOWN DISPOSABLE) ×2 IMPLANT
GOWN STRL REUS W/TWL XL LVL3 (GOWN DISPOSABLE) ×4 IMPLANT
KIT TURNOVER KIT A (KITS) ×2 IMPLANT
LOOP CUT BIPOLAR 24F LRG (ELECTROSURGICAL) ×2 IMPLANT
MANIFOLD NEPTUNE II (INSTRUMENTS) ×2 IMPLANT
PACK CYSTO (CUSTOM PROCEDURE TRAY) ×2 IMPLANT
PENCIL SMOKE EVACUATOR (MISCELLANEOUS) IMPLANT
TUBING CONNECTING 10 (TUBING) ×2 IMPLANT
TUBING UROLOGY SET (TUBING) ×2 IMPLANT

## 2021-08-12 NOTE — Anesthesia Preprocedure Evaluation (Addendum)
Anesthesia Evaluation  Patient identified by MRN, date of birth, ID band Patient awake    Reviewed: Allergy & Precautions, H&P , NPO status , Patient's Chart, lab work & pertinent test results  Airway Mallampati: II  TM Distance: >3 FB Neck ROM: Full    Dental  (+) Dental Advisory Given, Teeth Intact   Pulmonary shortness of breath (cpap o2 @ night) and Long-Term Oxygen Therapy, sleep apnea (CPAP @ night) , Recent URI , Resolved, former smoker,    Pulmonary exam normal breath sounds clear to auscultation       Cardiovascular hypertension, Pt. on medications pulmonary hypertension+ DOE  Normal cardiovascular exam Rhythm:Regular Rate:Normal  Echo 2017 - Left ventricle: The cavity size was normal. There was moderate concentric hypertrophy. Systolic function was normal. The estimated ejection fraction was in the range of 55% to 60%. Wall motion was normal; there were no regional wall motionabnormalities. Doppler parameters are consistent with abnormalleft ventricular relaxation (grade 1 diastolic dysfunction). There was no evidence of elevated ventricular filling pressure by Doppler parameters.  - Aortic valve: Trileaflet; normal thickness leaflets. There was no regurgitation. Valve area (Vmax): 2.79 cm^2.  - Aortic root: The aortic root was normal in size.  - Mitral valve: Structurally normal valve. There was no  regurgitation.  - Left atrium: The atrium was mildly dilated.  - Right ventricle: The cavity size was normal. Wall thickness was normal. Systolic function was normal.  - Right atrium: The atrium was normal in size.  - Tricuspid valve: There was trivial regurgitation.  - Pulmonary arteries: Systolic pressure was mildly increased. PA peak pressure: 33 mm Hg (S).  - Inferior vena cava: The vessel was normal in size.  - Pericardium, extracardiac: There was no pericardial effusion.    Neuro/Psych TIACVA, No Residual  Symptoms    GI/Hepatic   Endo/Other  Morbid obesity  Renal/GU      Musculoskeletal  (+) Arthritis ,   Abdominal   Peds  Hematology   Anesthesia Other Findings   Reproductive/Obstetrics                           Anesthesia Physical  Anesthesia Plan  ASA: 3  Anesthesia Plan: General   Post-op Pain Management:    Induction: Intravenous  PONV Risk Score and Plan: 3 and Ondansetron, Treatment may vary due to age or medical condition and Dexamethasone  Airway Management Planned: Oral ETT  Additional Equipment:   Intra-op Plan:   Post-operative Plan: Extubation in OR  Informed Consent: I have reviewed the patients History and Physical, chart, labs and discussed the procedure including the risks, benefits and alternatives for the proposed anesthesia with the patient or authorized representative who has indicated his/her understanding and acceptance.     Dental advisory given  Plan Discussed with: CRNA  Anesthesia Plan Comments:       Anesthesia Quick Evaluation

## 2021-08-12 NOTE — Anesthesia Postprocedure Evaluation (Signed)
Anesthesia Post Note  Patient: Timothy Mcdaniel  Procedure(s) Performed: TRANSURETHRAL RESECTION OF BLADDER TUMOR (TURBT) WITH POST OPERATIVE INSTILLATION OF GEMCITABINE (Bladder)     Patient location during evaluation: PACU Anesthesia Type: General Level of consciousness: sedated and patient cooperative Pain management: pain level controlled Vital Signs Assessment: post-procedure vital signs reviewed and stable Respiratory status: spontaneous breathing Cardiovascular status: stable Anesthetic complications: no   No notable events documented.  Last Vitals:  Vitals:   08/12/21 1330 08/12/21 1345  BP: (!) 145/91 (!) 148/91  Pulse: 81 71  Resp: (!) 32 11  Temp:  36.6 C  SpO2: 95% 95%    Last Pain:  Vitals:   08/12/21 1345  TempSrc:   PainSc: 0-No pain                 Nolon Nations

## 2021-08-12 NOTE — Op Note (Signed)
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  CT scan revealed a posterior left bladder tumor.  No metastatic disease or upper tract lesions.  Cystoscopy revealed the same plus the dome tumor.  Findings: On exam under anesthesia the penis was circumcised and without mass or lesion.  Testicles descended bilaterally and palpably normal.  On DRE prostate was about 40 g and smooth without hard area or nodule.  No bladder mass was palpated.  On cystoscopy the urethra was unremarkable although there was some narrowing near the membranous urethra.  Prostatic urethra was borderline obstructive from lateral lobe and a small median lobe.  Bladder was moderately trabeculated with stone debris in the prostatic urethra that was pushed up into the bladder and some stone debris in the bladder.  We think this was stone debris/calcification on the papillary tumor that broke off.  This was probably causing some of his dysuria.  The trigone and ureteral orifice ease were in their normal orthotopic position with clear reflux.  They were not involved.  The left posterior tumor was noted a much smaller 1 up toward the dome and then a similar sized 2 cm tumor that was right superior and lateral.  No other tumors were noted.  Description of procedure: After consent was obtained patient brought to the operating room.  After adequate anesthesia was placed lithotomy position and prepped and draped in the usual sterile fashion.  Timeout was performed to confirm the patient and procedure.  Cystoscope was passed per urethra the bladder was carefully inspected with a 30 degree and 70 degree lens.  We then switched out for the resectoscope  continuous-flow sheath with the visual obturator and then the loop and handle.  We started at the right superior lateral tumor and resected this down to the bladder wall.  Chips were evacuated and sent to pathology.  Adequate hemostasis was ensured at low pressure.  The small posterior tumor was very high up toward the dome the patient had a large capacity very tall bladder we can just barely get to it with the loop therefore we mainly ablated this tumor and then cauterized it.  This was a small tumor about 5 to 8 mm.  We then turned our attention to the left posterior tumor which was posterior bladder and up superior from the left UO.  This was resected down to the bladder wall.  Again hemostasis was ensured at low pressure.  Chips evacuated.  Having collected all the specimens the bladder was inspected again noted to be free of any concerned about perforation, adequate hemostasis was insured.  No other mucosal lesions were noted.  The scope was removed and an 7 [place] two-way catheter was advanced and the balloon inflated it was seated at the bladder neck and left to gravity drainage.  Drainage was clear.  On exam under anesthesia and DRE was performed.  He was awakened taken the cover room in stable condition.  Postoperative instillation of gemcitabine in PACU: Gemcitabine 2000 mg was instilled per urethra into the bladder and left indwelling for an hour and then drained.  Complications: None  Blood loss: 30 mL  Specimens to pathology: #1 right superior lateral bladder tumor #2 left posterior bladder tumor  Drains: 18 [place] two-way catheter-we  will remove that in 3 to 7 days.  Disposition: Patient stable to PACU

## 2021-08-12 NOTE — Transfer of Care (Signed)
Immediate Anesthesia Transfer of Care Note  Patient: Timothy Mcdaniel  Procedure(s) Performed: TRANSURETHRAL RESECTION OF BLADDER TUMOR (TURBT) WITH POST OPERATIVE INSTILLATION OF GEMCITABINE (Bladder)  Patient Location: PACU  Anesthesia Type:General  Level of Consciousness: awake, alert  and patient cooperative  Airway & Oxygen Therapy: Patient Spontanous Breathing and Patient connected to face mask oxygen  Post-op Assessment: Report given to RN and Post -op Vital signs reviewed and stable  Post vital signs: Reviewed and stable  Last Vitals:  Vitals Value Taken Time  BP    Temp    Pulse 72 08/12/21 1251  Resp 19 08/12/21 1251  SpO2 96 % 08/12/21 1251  Vitals shown include unvalidated device data.  Last Pain:  Vitals:   08/12/21 1056  TempSrc:   PainSc: 0-No pain         Complications: No notable events documented.

## 2021-08-12 NOTE — H&P (Signed)
H&P  Chief Complaint: Bladder neoplasm  History of Present Illness: Timothy Mcdaniel is a 75 year old male with a history of gross hematuria.  He underwent CT scan of the abdomen and pelvis June 2022 which was noncontrast due to contrast shortage.  This revealed a 19 mm left bladder base lesion.  There was no lymphadenopathy or bone lesion.  No hydronephrosis.  He underwent cystoscopy July 2022 which showed a tumor at the dome and the larger left base bladder tumor.  He continues to have some gross hematuria and clots which causes some dysuria but he has not had any bladder pain.  No fever.  Past Medical History:  Diagnosis Date   Arthritis    OA- both knees , L shoulder    Exogenous obesity    Hypercholesteremia    Hypertension    Nonischemic cardiomyopathy (HCC)    OA (osteoarthritis)    OSA on CPAP    Recurrent upper respiratory infection (URI)    DEC. had flu shot- followed by bad cold, treated /w OTC   Shortness of breath    Stroke ([facility])    ministroke   Past Surgical History:  Procedure Laterality Date   COLONOSCOPY N/A 02/09/2014   Procedure: COLONOSCOPY;  Surgeon: Daneil Dolin, MD;  [facility];  Service: Endoscopy;  Laterality: N/A;  11:30 AM   MANDIBLE FRACTURE SURGERY     1960's - occupational injury   TOTAL KNEE ARTHROPLASTY  02/15/2012   Procedure: TOTAL KNEE ARTHROPLASTY;  Surgeon: Rudean Haskell, MD;  Location: [facility];  Service: Orthopedics;  Laterality: Left;   TRANSTHORACIC ECHOCARDIOGRAM  08/03/2011   EF [id number]; LV systolic funtion mildly reduced; trace MR, mild TR    Home Medications:  Medications Prior to Admission  Medication Sig Dispense Refill Last Dose   aspirin 81 MG tablet Take 81 mg by mouth daily.   Past Month   Cholecalciferol (VITAMIN D3) 1.25 MG (50000 UT) CAPS Take 50,000 Units by mouth once a week.   Past Month   dorzolamide-timolol (COSOPT) 22.3-6.8 MG/ML ophthalmic solution Place 1 drop into both eyes 2 (two) times daily.   08/11/2021    fenofibrate (TRICOR) 48 MG tablet TAKE ONE TABLET BY MOUTH ONCE DAILY 90 tablet 3 08/11/2021   hydrochlorothiazide (HYDRODIURIL) 25 MG tablet Take 12.5 mg by mouth daily.   08/11/2021   KRILL OIL OMEGA-3 PO Take 500 mg by mouth daily.   Past Week   lisinopril (PRINIVIL,ZESTRIL) 40 MG tablet Take 40 mg by mouth daily.   08/11/2021   Multiple Minerals-Vitamins (CALCIUM CITRATE-MAG-MINERALS PO) Take 1 tablet by mouth daily at 12 noon. Zinc   Past Week   Multiple Vitamins-Minerals (CENTRUM SILVER ADULT 50+ PO) Take 1 tablet by mouth daily.   Past Week   traZODone (DESYREL) 100 MG tablet Take 100 mg by mouth at bedtime as needed for sleep.   08/11/2021   Allergies: No Known Allergies  Family History  Problem Relation Age of Onset   Heart attack Mother    Cancer Father    Kidney disease Brother    Anesthesia problems Neg Hx    Hypotension Neg Hx    Malignant hyperthermia Neg Hx    Pseudochol deficiency Neg Hx    Colon cancer Neg Hx    Social History:  reports that he quit smoking about 27 years ago. His smoking use included cigarettes. He has a 15.00 pack-year smoking history. He has never used smokeless tobacco. He reports current alcohol use. He reports  that he does not use drugs.  ROS: A complete review of systems was performed.  All systems are negative except for pertinent findings as noted. Review of Systems  All other systems reviewed and are negative.   Physical Exam:  Vital signs in last 24 hours: Temp:  [98.9 F (37.2 C)] 98.9 F (37.2 C) (08/30 1054) Pulse Rate:  [69] 69 (08/30 1054) Resp:  [18] 18 (08/30 1054) BP: (148)/(84) 148/84 (08/30 1054) SpO2:  [100 %] 100 % (08/30 1054) Weight:  [132.5 kg] 132.5 kg (08/30 1045) General:  Alert and oriented, No acute distress HEENT: Normocephalic, atraumatic Cardiovascular: Regular rate and rhythm Lungs: Regular rate and effort Abdomen: Soft, nontender, nondistended, no abdominal masses Back: No CVA tenderness Extremities: No  edema Neurologic: Grossly intact  Laboratory Data:  No results found for this or any previous visit (from the past 24 hour(s)). No results found for this or any previous visit (from the past 240 hour(s)). Creatinine: No results for input(s): CREATININE in the last 168 hours.  Impression/Assessment/plan:  Bladder neoplasm-I discussed with the patient the nature, potential benefits, risks and alternatives to TURBT with postoperative instillation of gemcitabine, including side effects of the proposed treatment, the likelihood of the patient achieving the goals of the procedure, and any potential problems that might occur during the procedure or recuperation.  We talked about possible need for ureteral stent and Foley catheter for prolonged period.  All questions answered. Patient elects to proceed.   [name] 08/12/2021, 11:28 AM

## 2021-08-12 NOTE — Anesthesia Procedure Notes (Signed)
Procedure Name: Intubation Date/Time: 08/12/2021 11:40 AM Performed by: Eben Burow, CRNA Pre-anesthesia Checklist: Patient identified, Emergency Drugs available, Suction available, Patient being monitored and Timeout performed Patient Re-evaluated:Patient Re-evaluated prior to induction Oxygen Delivery Method: Circle system utilized Preoxygenation: Pre-oxygenation with 100% oxygen Induction Type: IV induction Ventilation: Mask ventilation without difficulty Laryngoscope Size: Mac and 4 Grade View: Grade II Tube type: Oral Tube size: 7.5 mm Number of attempts: 1 Airway Equipment and Method: Stylet Placement Confirmation: ETT inserted through vocal cords under direct vision, positive ETCO2 and breath sounds checked- equal and bilateral Secured at: 23 cm Tube secured with: Tape Dental Injury: Teeth and Oropharynx as per pre-operative assessment

## 2021-08-12 NOTE — Discharge Instructions (Signed)
Cystoscopy (Bladder Exam) Care After Refer to this sheet in the next few weeks. These discharge instructions provide you with general information on caring for yourself after you leave the hospital. Your caregiver may also give you specific instructions. Your treatment has been planned according to the most current medical practices available, but unavoidable complications sometimes occur. If you have any problems or questions after discharge, please call your caregiver. AFTER THE PROCEDURE  There may be temporary bleeding and burning with urination.  Drink enough water and fluids to keep your urine clear or pale yellow.  If you have a stent, you will likely urinate more frequently and urgently until the stent is removed and you may experience some discomfort/pain in the lower abdomen and flank especially when urinating. You may take pain medication prescribed to you if needed for pain. You may also intermittently have blood in the urine until the stent is removed. If you have a catheter, you will be taught how to take care of the catheter by the nursing staff prior to discharge from the hospital.  You may periodically feel a strong urge to void with the catheter in place.  This is a bladder spasm and most often can occur when having a bowel movement or moving around. It is typically self-limited and usually will stop after a few minutes.  You may use some Vaseline or Neosporin around the tip of the catheter to reduce friction at the tip of the penis. You may also see some blood in the urine.  A very small amount of blood can make the urine look quite red.  As long as the catheter is draining well, there usually is not a problem.  However, if the catheter is not draining well and is bloody, you should call the office [phone]) to notify us.  FINDING OUT THE RESULTS OF YOUR TEST Not all test results are available during your visit. If your test results are not back during the visit, make an appointment  with your caregiver to find out the results. Do not assume everything is normal if you have not heard from your caregiver or the medical facility. It is important for you to follow up on all of your test results.  CALL OFFICE, [phone], OR SEEK IMMEDIATE MEDICAL CARE IF:  There is an increase in blood in the urine or you are passing clots.  There is difficulty passing urine.  You develop the chills.  You have an oral temperature above 102 F (38.9 C), not controlled by medicine.  Belly (abdominal) pain develops.

## 2021-08-13 ENCOUNTER — Encounter (HOSPITAL_COMMUNITY): Payer: Self-pay | Admitting: Urology

## 2021-08-13 LAB — SURGICAL PATHOLOGY

## 2021-08-19 ENCOUNTER — Telehealth: Payer: Self-pay

## 2021-08-19 NOTE — Telephone Encounter (Signed)
Called patient to schedule NV voiding trial. No answer. Will attempt again

## 2021-08-19 NOTE — Telephone Encounter (Signed)
-----   Message from [name], MD sent at 08/12/2021 12:51 PM EDT ----- Post-op NV - he needs a post-op NV for void trial foley removal in next 3-7 days. Thanks. Keep f/u with me 10/17.

## 2021-08-22 NOTE — Telephone Encounter (Signed)
Called patient again today. No answer. Left message to return call to office.

## 2021-08-25 NOTE — Telephone Encounter (Signed)
Left message for patient to return office call to have catheter removed.

## 2021-08-26 NOTE — Telephone Encounter (Signed)
Patient called stating that Dr. Junious Silk told him he could remove catheter himself. Patient states he has already removed his catheter and has had no complications. Patient will keep scheduled post op.

## 2021-09-29 ENCOUNTER — Other Ambulatory Visit: Payer: Self-pay

## 2021-09-29 ENCOUNTER — Ambulatory Visit (INDEPENDENT_AMBULATORY_CARE_PROVIDER_SITE_OTHER): Payer: Medicare Other | Admitting: Urology

## 2021-09-29 ENCOUNTER — Encounter: Payer: Self-pay | Admitting: Urology

## 2021-09-29 VITALS — BP 136/84 | HR 70 | Temp 98.1°F | Wt 299.0 lb

## 2021-09-29 DIAGNOSIS — C672 Malignant neoplasm of lateral wall of bladder: Secondary | ICD-10-CM

## 2021-09-29 DIAGNOSIS — D414 Neoplasm of uncertain behavior of bladder: Secondary | ICD-10-CM

## 2021-09-29 LAB — MICROSCOPIC EXAMINATION: Renal Epithel, UA: NONE SEEN /hpf

## 2021-09-29 LAB — URINALYSIS, ROUTINE W REFLEX MICROSCOPIC
Bilirubin, UA: NEGATIVE
Glucose, UA: NEGATIVE
Nitrite, UA: NEGATIVE
Specific Gravity, UA: 1.02 (ref 1.005–1.030)
Urobilinogen, Ur: 1 mg/dL (ref 0.2–1.0)
pH, UA: 7 (ref 5.0–7.5)

## 2021-09-29 NOTE — Progress Notes (Signed)
09/29/2021 10:51 AM   Timothy Mcdaniel 06-10-1946 [id number]  Referring provider: Redmond School, MD [address] [place],  [place] [address]  No chief complaint on file.   HPI:  F/u -   1) bladder ca - pt dx with high-grade T1 bladder cancer August 2022.  He presented with gross hematuria.  Biopsy/TUR: August 2022-high-grade T1 right lateral and left up toward dome. 2 cm.  Gemcitabine instilled.  Staging: June 2022 CT scan abdomen and pelvis-benign.  No bone lesions or lymphadenopathy.  He is well today. No gross hematuria. He notes hematuria resolved and LUTS have improved.   PMH: Past Medical History:  Diagnosis Date   Arthritis    OA- both knees , L shoulder    Exogenous obesity    Hypercholesteremia    Hypertension    Nonischemic cardiomyopathy (HCC)    OA (osteoarthritis)    OSA on CPAP    Recurrent upper respiratory infection (URI)    DEC. had flu shot- followed by bad cold, treated /w OTC   Shortness of breath    Stroke [facility])    ministroke    Surgical History: Past Surgical History:  Procedure Laterality Date   COLONOSCOPY N/A 02/09/2014   Procedure: COLONOSCOPY;  Surgeon: Daneil Dolin, MD;  [facility];  Service: Endoscopy;  Laterality: N/A;  11:30 AM   MANDIBLE FRACTURE SURGERY     1960's - occupational injury   TOTAL KNEE ARTHROPLASTY  02/15/2012   Procedure: TOTAL KNEE ARTHROPLASTY;  Surgeon: Rudean Haskell, MD;  Location: [facility];  Service: Orthopedics;  Laterality: Left;   TRANSTHORACIC ECHOCARDIOGRAM  08/03/2011   EF 29%; LV systolic funtion mildly reduced; trace MR, mild TR   TRANSURETHRAL RESECTION OF BLADDER TUMOR N/A 08/12/2021   Procedure: TRANSURETHRAL RESECTION OF BLADDER TUMOR (TURBT) WITH POST OPERATIVE INSTILLATION OF GEMCITABINE;  Surgeon: [name], MD;  Location[facility];  Service: Urology;  Laterality: N/A;    Home Medications:  Allergies as of 09/29/2021   No Known Allergies      Medication List         Accurate as of September 29, 2021 10:51 AM. If you have any questions, ask your nurse or doctor.          aspirin 81 MG tablet Take 81 mg by mouth daily.   CALCIUM CITRATE-MAG-MINERALS PO Take 1 tablet by mouth daily at 12 noon. Zinc   CENTRUM SILVER ADULT 50+ PO Take 1 tablet by mouth daily.   dorzolamide-timolol 22.3-6.8 MG/ML ophthalmic solution Commonly known as: COSOPT Place 1 drop into both eyes 2 (two) times daily.   fenofibrate 48 MG tablet Commonly known as: TRICOR TAKE ONE TABLET BY MOUTH ONCE DAILY   hydrochlorothiazide 25 MG tablet Commonly known as: HYDRODIURIL Take 12.5 mg by mouth daily.   KRILL OIL OMEGA-3 PO Take 500 mg by mouth daily.   lisinopril 40 MG tablet Commonly known as: ZESTRIL Take 40 mg by mouth daily.   traZODone 100 MG tablet Commonly known as: DESYREL Take 100 mg by mouth at bedtime as needed for sleep.   Vitamin D3 1.25 MG (50000 UT) Caps Take 50,000 Units by mouth once a week.        Allergies: No Known Allergies  Family History: Family History  Problem Relation Age of Onset   Heart attack Mother    Cancer Father    Kidney disease Brother    Anesthesia problems Neg Hx    Hypotension Neg Hx  Malignant hyperthermia Neg Hx    Pseudochol deficiency Neg Hx    Colon cancer Neg Hx     Social History:  reports that he quit smoking about 27 years ago. His smoking use included cigarettes. He has a 15.00 pack-year smoking history. He has never used smokeless tobacco. He reports current alcohol use. He reports that he does not use drugs.   Physical Exam: BP 136/84   Pulse 70   Temp 98.1 F (36.7 C)   Wt 299 lb (135.6 kg)   BMI 41.70 kg/m   Constitutional:  Alert and oriented, No acute distress. HEENT: [place] AT, moist mucus membranes.  Trachea midline, no masses. Cardiovascular: No clubbing, cyanosis, or edema. Respiratory: Normal respiratory effort, no increased work of breathing. GI: Abdomen is soft, nontender,  nondistended, no abdominal masses GU: No CVA tenderness Skin: No rashes, bruises or suspicious lesions. Neurologic: Grossly intact, no focal deficits, moving all 4 extremities. Psychiatric: Normal mood and affect.  Laboratory Data: Lab Results  Component Value Date   WBC 5.8 08/05/2021   HGB 14.2 08/05/2021   HCT 44.0 08/05/2021   MCV 93.8 08/05/2021   PLT 249 08/05/2021    Lab Results  Component Value Date   CREATININE 0.95 08/05/2021    No results found for: PSA  No results found for: TESTOSTERONE  No results found for: HGBA1C  Urinalysis    Component Value Date/Time   COLORURINE YELLOW 02/04/2012 1221   APPEARANCEUR Clear 06/30/2021 1043   LABSPEC 1.017 02/04/2012 1221   PHURINE 8.0 02/04/2012 1221   GLUCOSEU Negative 06/30/2021 1043   HGBUR NEGATIVE 02/04/2012 1221   BILIRUBINUR Negative 06/30/2021 1043   KETONESUR NEGATIVE 02/04/2012 1221   PROTEINUR 1+ (A) 06/30/2021 1043   PROTEINUR NEGATIVE 02/04/2012 1221   UROBILINOGEN 1.0 02/04/2012 1221   NITRITE Negative 06/30/2021 1043   NITRITE NEGATIVE 02/04/2012 1221   LEUKOCYTESUR Negative 06/30/2021 1043    Lab Results  Component Value Date   LABMICR See below: 06/30/2021   WBCUA None seen 06/30/2021   LABEPIT None seen 06/30/2021   MUCUS Present 06/30/2021   BACTERIA None seen 06/30/2021   Reviewed path report with patient.   Assessment & Plan:    1. Bladder cancer lateral  -we discussed his stage, grade and prognosis.  We discussed the importance of repeat staging and also the possibility of recurrence and progression.  We went over the nature risk benefits and alternatives to BCG treatment and he will consider.  I am going to schedule him for repeat cystoscopy, bladder biopsy/TUR to ensure proper staging and no residual disease. Check bilateral RGP. Possible gemcitabine vs. Surveillance cysto in office. He will proceed with OR. He will follow-up to review and we can get BCG started after that.  We  considered starting it now but he is about to leave for [place] and will not be back until December. - Urinalysis, Routine w reflex microscopic   No follow-ups on file.  [name], MD  [facility]  [address] [place], [place] [address] [phone]

## 2021-09-29 NOTE — Progress Notes (Signed)
Urological Symptom Review  Patient is experiencing the following symptoms: N/a   Review of Systems  Gastrointestinal (upper)  : Negative for upper GI symptoms  Gastrointestinal (lower) : Negative for lower GI symptoms  Constitutional : Fatigue  Skin: Negative for skin symptoms  Eyes: Negative for eye symptoms  Ear/Nose/Throat : Negative for Ear/Nose/Throat symptoms  Hematologic/Lymphatic: Negative for Hematologic/Lymphatic symptoms  Cardiovascular : Negative for cardiovascular symptoms  Respiratory : Shortness of breath  Endocrine: Negative for endocrine symptoms  Musculoskeletal: Negative for musculoskeletal symptoms  Neurological: Negative for neurological symptoms  Psychologic: Negative for psychiatric symptoms

## 2021-09-30 ENCOUNTER — Telehealth: Payer: Self-pay

## 2021-09-30 NOTE — Telephone Encounter (Signed)
Surgical posting sheet, demographics, ov and insurance faxed to [facility] per Dr. Junious Silk for scheduling.

## 2021-10-13 ENCOUNTER — Other Ambulatory Visit: Payer: Self-pay | Admitting: Urology

## 2021-11-04 NOTE — Patient Instructions (Signed)
DUE TO COVID-19 ONLY ONE VISITOR IS ALLOWED TO COME WITH YOU AND STAY IN THE WAITING ROOM ONLY DURING PRE OP AND PROCEDURE DAY OF SURGERY.   Up to two visitors ages 16+ are allowed at one time in a patient's room.  The visitors may rotate out with other people throughout the day.  Additionally, up to two children between the ages of 30 and 54 are allowed and do not count toward the number of allowed visitors.  Children within this age range must be accompanied by an adult visitor.  One adult visitor may remain with the patient overnight and must be in the room by 8 PM.    Your procedure is scheduled on: 11-14-21   Report to [facility] Main  Entrance   Report to admitting at        [place]  AM     Call this number if you have problems the morning of surgery [phone]   Remember: Do not eat food :After Midnight.  You may have clear liquids until 0600 am then nothing by mouth    CLEAR LIQUID DIET                                                                    water Black Coffee and tea, regular and decaf No Creamer                            Plain Jell-O any favor except red or purple                                  Fruit ices (not with fruit pulp)                                      Iced Popsicles                                     Carbonated beverages, regular and diet                                    Cranberry, grape and apple juices Sports drinks like Gatorade Lightly seasoned clear broth or consume(fat free) Sugar, honey syrup   _____________________________________________________________________     BRUSH YOUR TEETH MORNING OF SURGERY AND RINSE YOUR MOUTH OUT, NO CHEWING GUM CANDY OR MINTS.     Take these medicines the morning of surgery with A SIP OF WATER: eye drops, fenofibrate                                 You may not have any metal on your body including hair pins and              piercings  Do not wear jewelry,  lotions, powders,perfumes,         deodorant  Men may shave face and neck.   Do not bring valuables to the hospital. [place].  Contacts, dentures or bridgework may not be worn into surgery.       Patients discharged the day of surgery will not be allowed to drive home. IF YOU ARE HAVING SURGERY AND GOING HOME THE SAME DAY, YOU MUST HAVE AN ADULT TO DRIVE YOU HOME AND BE WITH YOU FOR 24 HOURS. YOU MAY GO HOME BY TAXI OR UBER OR ORTHERWISE, BUT AN ADULT MUST ACCOMPANY YOU HOME AND STAY WITH YOU FOR 24 HOURS.  Name and phone number of your driver:  Special Instructions: N/A              Please read over the following fact sheets you were given: _____________________________________________________________________             [facility] - Preparing for Surgery Before surgery, you can play an important role.  Because skin is not sterile, your skin needs to be as free of germs as possible.  You can reduce the number of germs on your skin by washing with CHG (chlorahexidine gluconate) soap before surgery.  CHG is an antiseptic cleaner which kills germs and bonds with the skin to continue killing germs even after washing. Please DO NOT use if you have an allergy to CHG or antibacterial soaps.  If your skin becomes reddened/irritated stop using the CHG and inform your nurse when you arrive at Short Stay. Do not shave (including legs and underarms) for at least 48 hours prior to the first CHG shower.  You may shave your face/neck. Please follow these instructions carefully:  1.  Shower with CHG Soap the night before surgery and the  morning of Surgery.  2.  If you choose to wash your hair, wash your hair first as usual with your  normal  shampoo.  3.  After you shampoo, rinse your hair and body thoroughly to remove the  shampoo.                           4.  Use CHG as you would any other liquid soap.  You can apply chg directly  to the skin and wash                        Gently with a scrungie or clean washcloth.  5.  Apply the CHG Soap to your body ONLY FROM THE NECK DOWN.   Do not use on face/ open                           Wound or open sores. Avoid contact with eyes, ears mouth and genitals (private parts).                       Wash face,  Genitals (private parts) with your normal soap.             6.  Wash thoroughly, paying special attention to the area where your surgery  will be performed.  7.  Thoroughly rinse your body with warm water from the neck down.  8.  DO NOT shower/wash with your normal soap after using and rinsing off  the CHG Soap.  9.  Pat yourself dry with a clean towel.            10.  Wear clean pajamas.            11.  Place clean sheets on your bed the night of your first shower and do not  sleep with pets. Day of Surgery : Do not apply any lotions/deodorants the morning of surgery.  Please wear clean clothes to the hospital/surgery center.  FAILURE TO FOLLOW THESE INSTRUCTIONS MAY RESULT IN THE CANCELLATION OF YOUR SURGERY PATIENT SIGNATURE_________________________________  NURSE SIGNATURE__________________________________  ________________________________________________________________________

## 2021-11-04 NOTE — Progress Notes (Addendum)
PCP - [facility], MD Cardiologist - Lyman Bishop LOV 2017  Saw post TIA  No longer sees Heart doctor   PPM/ICD -  Device Orders -  Rep Notified -   Chest x-ray -  EKG - 08-05-21 epic Stress Test - 2017 ECHO - 2017 Cardiac Cath -   Sleep Study -  CPAP -   Fasting Blood Sugar -  Checks Blood Sugar _____ times a day  Blood Thinner Instructions: Aspirin Instructions:  ERAS Protcol - PRE-SURGERY Ensure or G2-   COVID TEST- N/A COVID vaccine -Vaccinated x2 Moderna  Activity--Able to walk around home without SOB SOB when climbing stairs is nothing new for pt.  Anesthesia review: TIA, HTN, OSA, BP elevated at preop advised to continue checking at home and reach out to PCP if it remains elevated  Patient denies shortness of breath, fever, cough and chest pain at PAT appointment   All instructions explained to the patient, with a verbal understanding of the material. Patient agrees to go over the instructions while at home for a better understanding. Patient also instructed to self quarantine after being tested for COVID-19. The opportunity to ask questions was provided.

## 2021-11-10 ENCOUNTER — Encounter (HOSPITAL_COMMUNITY)
Admission: RE | Admit: 2021-11-10 | Discharge: 2021-11-10 | Disposition: A | Discharge status: Home / Self Care | Payer: Medicare Other | Source: Ambulatory Visit | Attending: Urology | Admitting: Urology

## 2021-11-10 ENCOUNTER — Other Ambulatory Visit: Payer: Self-pay

## 2021-11-10 ENCOUNTER — Encounter (HOSPITAL_COMMUNITY): Payer: Self-pay

## 2021-11-10 VITALS — BP 154/103 | HR 65 | Temp 98.3°F | Resp 16 | Ht 71.0 in | Wt 277.0 lb

## 2021-11-10 DIAGNOSIS — I10 Essential (primary) hypertension: Secondary | ICD-10-CM | POA: Diagnosis not present

## 2021-11-10 DIAGNOSIS — Z01812 Encounter for preprocedural laboratory examination: Secondary | ICD-10-CM | POA: Diagnosis not present

## 2021-11-10 LAB — BASIC METABOLIC PANEL
Anion gap: 9 (ref 5–15)
BUN: 17 mg/dL (ref 8–23)
CO2: 25 mmol/L (ref 22–32)
Calcium: 9.3 mg/dL (ref 8.9–10.3)
Chloride: 106 mmol/L (ref 98–111)
Creatinine, Ser: 0.88 mg/dL (ref 0.61–1.24)
GFR, Estimated: >60 mL/min (ref >60)
Glucose, Bld: 122 mg/dL — ABNORMAL HIGH (ref 70–99)
Potassium: 4.9 mmol/L (ref 3.5–5.1)
Sodium: 140 mmol/L (ref 135–145)

## 2021-11-10 LAB — CBC
HCT: 42.4 % (ref 39.0–52.0)
Hemoglobin: 13.7 g/dL (ref 13.0–17.0)
MCH: 30.2 pg (ref 26.0–34.0)
MCHC: 32.3 g/dL (ref 30.0–36.0)
MCV: 93.6 fL (ref 80.0–100.0)
Platelets: 243 10*3/uL (ref 150–400)
RBC: 4.53 MIL/uL (ref 4.22–5.81)
RDW: 14.3 % (ref 11.5–15.5)
WBC: 5 10*3/uL (ref 4.0–10.5)
nRBC: 0 % (ref 0.0–0.2)

## 2021-11-14 ENCOUNTER — Ambulatory Visit (HOSPITAL_COMMUNITY)
Admission: RE | Admit: 2021-11-14 | Discharge: 2021-11-14 | Disposition: A | Discharge status: Home / Self Care | Payer: Medicare Other | Attending: Urology | Admitting: Urology

## 2021-11-14 ENCOUNTER — Ambulatory Visit (HOSPITAL_COMMUNITY): Payer: Medicare Other

## 2021-11-14 ENCOUNTER — Encounter (HOSPITAL_COMMUNITY): Payer: Self-pay | Admitting: Urology

## 2021-11-14 ENCOUNTER — Encounter (HOSPITAL_COMMUNITY)
Admission: RE | Disposition: A | Discharge status: Home / Self Care | Payer: Self-pay | Source: Home / Self Care | Attending: Urology

## 2021-11-14 ENCOUNTER — Ambulatory Visit (HOSPITAL_COMMUNITY): Payer: Medicare Other | Admitting: Physician Assistant

## 2021-11-14 DIAGNOSIS — R310 Gross hematuria: Secondary | ICD-10-CM | POA: Insufficient documentation

## 2021-11-14 DIAGNOSIS — N401 Enlarged prostate with lower urinary tract symptoms: Secondary | ICD-10-CM | POA: Diagnosis not present

## 2021-11-14 DIAGNOSIS — Z9989 Dependence on other enabling machines and devices: Secondary | ICD-10-CM | POA: Diagnosis not present

## 2021-11-14 DIAGNOSIS — Z87891 Personal history of nicotine dependence: Secondary | ICD-10-CM | POA: Insufficient documentation

## 2021-11-14 DIAGNOSIS — I10 Essential (primary) hypertension: Secondary | ICD-10-CM | POA: Diagnosis not present

## 2021-11-14 DIAGNOSIS — Z8551 Personal history of malignant neoplasm of bladder: Secondary | ICD-10-CM | POA: Insufficient documentation

## 2021-11-14 DIAGNOSIS — Z6838 Body mass index (BMI) 38.0-38.9, adult: Secondary | ICD-10-CM | POA: Insufficient documentation

## 2021-11-14 DIAGNOSIS — Z79899 Other long term (current) drug therapy: Secondary | ICD-10-CM | POA: Insufficient documentation

## 2021-11-14 DIAGNOSIS — N138 Other obstructive and reflux uropathy: Secondary | ICD-10-CM | POA: Diagnosis not present

## 2021-11-14 DIAGNOSIS — G4733 Obstructive sleep apnea (adult) (pediatric): Secondary | ICD-10-CM | POA: Diagnosis not present

## 2021-11-14 DIAGNOSIS — D303 Benign neoplasm of bladder: Secondary | ICD-10-CM | POA: Diagnosis not present

## 2021-11-14 DIAGNOSIS — C679 Malignant neoplasm of bladder, unspecified: Secondary | ICD-10-CM | POA: Diagnosis not present

## 2021-11-14 DIAGNOSIS — C674 Malignant neoplasm of posterior wall of bladder: Secondary | ICD-10-CM

## 2021-11-14 HISTORY — PX: TRANSURETHRAL RESECTION OF BLADDER TUMOR: SHX2575

## 2021-11-14 SURGERY — TURBT (TRANSURETHRAL RESECTION OF BLADDER TUMOR)
Anesthesia: General

## 2021-11-14 MED ORDER — CEFAZOLIN IN SODIUM CHLORIDE 3-0.9 GM/100ML-% IV SOLN
3.0000 g | Freq: Once | INTRAVENOUS | Status: AC
  Administered 2021-11-14: 3 g via INTRAVENOUS
  Filled 2021-11-14: qty 100

## 2021-11-14 MED ORDER — CHLORHEXIDINE GLUCONATE 0.12 % MT SOLN
15.0000 mL | Freq: Once | OROMUCOSAL | Status: AC
  Administered 2021-11-14: 15 mL via OROMUCOSAL

## 2021-11-14 MED ORDER — ONDANSETRON HCL 4 MG/2ML IJ SOLN
INTRAMUSCULAR | Status: AC
  Filled 2021-11-14: qty 2

## 2021-11-14 MED ORDER — IOHEXOL 300 MG/ML  SOLN
INTRAMUSCULAR | Status: DC | PRN
  Administered 2021-11-14: 15 mL

## 2021-11-14 MED ORDER — ORAL CARE MOUTH RINSE: 15.0000 mL | Freq: Once | OROMUCOSAL | Status: AC

## 2021-11-14 MED ORDER — LIDOCAINE HCL (CARDIAC) PF 100 MG/5ML IV SOSY
PREFILLED_SYRINGE | INTRAVENOUS | Status: DC | PRN
  Administered 2021-11-14: 60 mg via INTRAVENOUS

## 2021-11-14 MED ORDER — DEXAMETHASONE SODIUM PHOSPHATE 4 MG/ML IJ SOLN
INTRAMUSCULAR | Status: DC | PRN
  Administered 2021-11-14: 5 mg via INTRAVENOUS

## 2021-11-14 MED ORDER — PHENYLEPHRINE 40 MCG/ML (10ML) SYRINGE FOR IV PUSH (FOR BLOOD PRESSURE SUPPORT)
PREFILLED_SYRINGE | INTRAVENOUS | Status: DC | PRN
  Administered 2021-11-14: 80 ug via INTRAVENOUS

## 2021-11-14 MED ORDER — LACTATED RINGERS IV SOLN: INTRAVENOUS | Status: DC

## 2021-11-14 MED ORDER — FENTANYL CITRATE (PF) 100 MCG/2ML IJ SOLN
INTRAMUSCULAR | Status: AC
  Filled 2021-11-14: qty 2

## 2021-11-14 MED ORDER — FENTANYL CITRATE PF 50 MCG/ML IJ SOSY: 25.0000 ug | PREFILLED_SYRINGE | INTRAMUSCULAR | Status: DC | PRN

## 2021-11-14 MED ORDER — LIDOCAINE HCL (PF) 2 % IJ SOLN
INTRAMUSCULAR | Status: AC
  Filled 2021-11-14: qty 5

## 2021-11-14 MED ORDER — SODIUM CHLORIDE 0.9 % IR SOLN
Status: DC | PRN
  Administered 2021-11-14: 3000 mL

## 2021-11-14 MED ORDER — CEFAZOLIN SODIUM 1 G IJ SOLR
INTRAMUSCULAR | Status: AC
  Filled 2021-11-14: qty 10

## 2021-11-14 MED ORDER — ONDANSETRON HCL 4 MG/2ML IJ SOLN
INTRAMUSCULAR | Status: DC | PRN
  Administered 2021-11-14: 4 mg via INTRAVENOUS

## 2021-11-14 MED ORDER — LIDOCAINE HCL URETHRAL/MUCOSAL 2 % EX GEL
CUTANEOUS | Status: AC
  Filled 2021-11-14: qty 30

## 2021-11-14 MED ORDER — FENTANYL CITRATE (PF) 100 MCG/2ML IJ SOLN
INTRAMUSCULAR | Status: DC | PRN
  Administered 2021-11-14: 50 ug via INTRAVENOUS
  Administered 2021-11-14: 25 ug via INTRAVENOUS
  Administered 2021-11-14 (×2): 50 ug via INTRAVENOUS
  Administered 2021-11-14: 25 ug via INTRAVENOUS

## 2021-11-14 MED ORDER — DEXAMETHASONE SODIUM PHOSPHATE 10 MG/ML IJ SOLN
INTRAMUSCULAR | Status: AC
  Filled 2021-11-14: qty 1

## 2021-11-14 MED ORDER — LIDOCAINE HCL URETHRAL/MUCOSAL 2 % EX GEL
CUTANEOUS | Status: DC | PRN
  Administered 2021-11-14: 1

## 2021-11-14 MED ORDER — ROCURONIUM BROMIDE 10 MG/ML (PF) SYRINGE
PREFILLED_SYRINGE | INTRAVENOUS | Status: AC
  Filled 2021-11-14: qty 10

## 2021-11-14 MED ORDER — PROPOFOL 10 MG/ML IV BOLUS
INTRAVENOUS | Status: DC | PRN
  Administered 2021-11-14: 200 mg via INTRAVENOUS

## 2021-11-14 SURGICAL SUPPLY — 13 items
BAG URINE DRAIN 2000ML AR STRL (UROLOGICAL SUPPLIES) IMPLANT
BAG URO CATCHER STRL LF (MISCELLANEOUS) ×2 IMPLANT
CATH URETL 5X70 OPEN END (CATHETERS) ×2 IMPLANT
DRAPE FOOT SWITCH (DRAPES) ×2 IMPLANT
GLOVE SURG ENC TEXT LTX SZ7.5 (GLOVE) ×2 IMPLANT
GOWN STRL REUS W/TWL XL LVL3 (GOWN DISPOSABLE) ×2 IMPLANT
KIT TURNOVER KIT A (KITS) IMPLANT
LOOP CUT BIPOLAR 24F LRG (ELECTROSURGICAL) ×4 IMPLANT
MANIFOLD NEPTUNE II (INSTRUMENTS) ×2 IMPLANT
PACK CYSTO (CUSTOM PROCEDURE TRAY) ×2 IMPLANT
PENCIL SMOKE EVACUATOR (MISCELLANEOUS) IMPLANT
TUBING CONNECTING 10 (TUBING) ×2 IMPLANT
TUBING UROLOGY SET (TUBING) ×2 IMPLANT

## 2021-11-14 NOTE — Op Note (Addendum)
Preoperative diagnosis: Bladder cancer Postoperative diagnosis: Same  Procedure: Cystoscopy, bilateral retrograde pyelogram, TURBT small 0.5 to 2 cm  Surgeon: Junious Silk  Anesthesia: General  Indication for procedure: Timothy Mcdaniel is a 75 year old male that had 2 bladder tumors which were high-grade T1 on resection August 2022.  He was brought back today for cystoscopy and repeat resection.  Upper tract surveillance.  Findings: On exam under anesthesia penis circumcised without mass or lesion.  Testicles descended bilaterally, prostate about 30 g smooth without hard area or nodule.  Left retrograde pyelogram-this outlined a single ureter single collecting system unit without filling defect, stricture or dilation.  Right retrograde pyelogram-this outlined a single ureter single collecting system unit without filling defect, stricture or dilation.   On cystoscopy the urethra was unremarkable, prostate partially obstructed with some lateral and a small median lobe hypertrophy.  Bladder was moderately trabeculated.  Small right bladder diverticulum and just superior to this prior resection site was noted with some erythema about the resection site likely consistent with granulation tissue and healing.  Similarly left posterior superior prior resection site healing with some erythematous tissue.  The sites were reresected.  Otherwise, there were no new or recurrent papillary tumors and no conspicuous patches of erythema.  Description of procedure: After consent was obtained patient brought to the operating room.  After adequate anesthesia was placed lithotomy position and prepped and draped in the usual sterile fashion.  Timeout was performed confirm the patient and procedure.  Cystoscope was passed per urethra and the bladder inspected carefully with 30 degree and 70 degree lens.  Left ureteral orifice was then cannulated with a 5 [place] open-ended catheter and left retrograde injection of contrast was  performed.  Similarly right retrograde was performed.  Fluoroscopic imaging obtained during left and right retrograde.  I then took the loop and resected the prior left posterior site and sent two pieces for pathology.  The loop burned a little bright and he had an obturator reflex even though we were not over near the left side therefore turned the power down during this resection.  I then tried to resect the right side but there was not enough power so it was increased but I could not get good purchase as it was a superior right location and a bit of a difficult region awkward angle with the loop.  The loop made a small divot but I could not get any tissue to come off.  Ended up passing the cold cup biopsy forceps and taking 2 biopsies from tissue that the loop and partially removed.  These were sent to pathology.  We then tried to cauterize this site but there was a fall on the connector to the machine.  We ended up having to change machines and then the right superior site was fulgurated.  Hemostasis was excellent both at the right superior and left posterior site.  There was no concern for perforation.  He did not need a Foley catheter.  He did not need gemcitabine.  The scope was backed out and a partially filled the bladder.  Lidocaine jelly instilled per urethra.  Exam under anesthesia performed.  He was awakened taken to cover him in stable condition.  Complications: None  Blood loss: Minimal  Specimens to pathology: #1 left posterior bladder biopsy #2 right lateral superior bladder biopsy  Drains: None  Disposition: Patient stable to PACU - discussed with Hilda procedure, postop and follow-up care

## 2021-11-14 NOTE — H&P (Signed)
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  He presented with gross hematuria.  He had a right lateral tumor and the tumor up toward the dome.  Gemcitabine was utilized in PACU.  A staging CT scan was done June 2022 with no metastatic disease or upper tract lesions.  He is brought today for repeat cystoscopy with TUR versus bladder biopsy.  He has been well without dysuria or gross hematuria.  No fever.  Past Medical History:  Diagnosis Date   Arthritis    OA- both knees , L shoulder    Exogenous obesity    Hypercholesteremia    Hypertension    Nonischemic cardiomyopathy (HCC)    OA (osteoarthritis)    OSA on CPAP    Recurrent upper respiratory infection (URI)    DEC. had flu shot- followed by bad cold, treated /w OTC   Shortness of breath    Stroke ([place])    ministroke   Past Surgical History:  Procedure Laterality Date   COLONOSCOPY N/A 02/09/2014   Procedure: COLONOSCOPY;  Surgeon: Daneil Dolin, MD;  Location: [facility];  Service: Endoscopy;  Laterality: N/A;  11:30 AM   EYE SURGERY     cataracts   MANDIBLE FRACTURE SURGERY     1960's - occupational injury   TOTAL KNEE ARTHROPLASTY  02/15/2012   Procedure: TOTAL KNEE ARTHROPLASTY;  Surgeon: Rudean Haskell, MD;  Location: [place];  Service: Orthopedics;  Laterality: Left;   TRANSTHORACIC ECHOCARDIOGRAM  08/03/2011   EF 01%; LV systolic funtion mildly reduced; trace MR, mild TR   TRANSURETHRAL RESECTION OF BLADDER TUMOR N/A 08/12/2021   Procedure: TRANSURETHRAL RESECTION OF BLADDER TUMOR (TURBT) WITH POST OPERATIVE INSTILLATION OF GEMCITABINE;  Surgeon: [name], MD;  Location: [facility];  Service: Urology;  Laterality: N/A;    Home Medications:  Medications Prior to Admission  Medication Sig Dispense Refill Last Dose   aspirin 81 MG tablet Take 81 mg by mouth daily.   11/13/2021    Calcium-Magnesium-Zinc (CAL-MAG-ZINC PO) Take 1 tablet by mouth daily.   11/13/2021   Cholecalciferol (VITAMIN D3) 125 MCG (5000 UT) CAPS Take 5,000 Units by mouth 2 (two) times a week. Tues and Fri   11/13/2021   diclofenac Sodium (VOLTAREN) 1 % GEL Apply 1 application topically daily as needed (pain).   Past Month   dorzolamide-timolol (COSOPT) 22.3-6.8 MG/ML ophthalmic solution Place 1 drop into the right eye 2 (two) times daily.   11/13/2021   fenofibrate (TRICOR) 48 MG tablet TAKE ONE TABLET BY MOUTH ONCE DAILY 90 tablet 3 11/13/2021   hydrochlorothiazide (HYDRODIURIL) 12.5 MG tablet Take 12.5 mg by mouth daily.   11/13/2021   KRILL OIL OMEGA-3 PO Take 500 mg by mouth daily.   11/13/2021   lisinopril (PRINIVIL,ZESTRIL) 40 MG tablet Take 40 mg by mouth daily.   11/13/2021   Menthol, Topical Analgesic, (BIOFREEZE) 4 % GEL Apply 1 application topically daily as needed (pain).   Past Week   Multiple Vitamins-Minerals (CENTRUM SILVER ADULT 50+ PO) Take 1 tablet by mouth daily.   11/13/2021   oxymetazoline (AFRIN) 0.05 % nasal spray Place 1 spray into both nostrils 2 (two) times daily as needed for congestion.   11/13/2021   traMADol (ULTRAM) 50 MG tablet Take 50 mg by mouth every 6 (six) hours as needed for severe pain.   Past Month  traZODone (DESYREL) 100 MG tablet Take 100 mg by mouth at bedtime.   11/13/2021   Allergies: No Known Allergies  Family History  Problem Relation Age of Onset   Heart attack Mother    Cancer Father    Kidney disease Brother    Anesthesia problems Neg Hx    Hypotension Neg Hx    Malignant hyperthermia Neg Hx    Pseudochol deficiency Neg Hx    Colon cancer Neg Hx    Social History:  reports that he quit smoking about 27 years ago. His smoking use included cigarettes. He has a 15.00 pack-year smoking history. He has never used smokeless tobacco. He reports current alcohol use. He reports that he does not use drugs.  ROS: A complete review of systems was performed.  All  systems are negative except for pertinent findings as noted. Review of Systems  All other systems reviewed and are negative.   Physical Exam:  Vital signs in last 24 hours: Temp:  [98.6 F (37 C)] 98.6 F (37 C) (12/02 0657) Pulse Rate:  [81] 81 (12/02 0657) Resp:  [18] 18 (12/02 0657) BP: (147-226)/(90-130) 147/90 (12/02 0720) SpO2:  [98 %] 98 % (12/02 0657) Weight:  [125.6 kg] 125.6 kg (12/02 0716) General:  Alert and oriented, No acute distress HEENT: Normocephalic, atraumatic Cardiovascular: Regular rate and rhythm Lungs: Regular rate and effort Abdomen: Soft, nontender, nondistended, no abdominal masses Back: No CVA tenderness Extremities: No edema Neurologic: Grossly intact  Laboratory Data:  No results found for this or any previous visit (from the past 24 hour(s)). No results found for this or any previous visit (from the past 240 hour(s)). Creatinine: Recent Labs    11/10/21 1346  CREATININE 0.88    Impression/Assessment:  Bladder cancer-high-grade T1 disease-  Plan:  I discussed with the patient the nature, potential benefits, risks and alternatives to TURBT, including side effects of the proposed treatment, the likelihood of the patient achieving the goals of the procedure, and any potential problems that might occur during the procedure or recuperation.  We discussed he may need a postoperative Foley for a period of time.  All questions answered. Patient elects to proceed.   [name] 11/14/2021, 8:15 AM

## 2021-11-14 NOTE — Anesthesia Preprocedure Evaluation (Addendum)
Anesthesia Evaluation  Patient identified by MRN, date of birth, ID band Patient awake    Reviewed: Allergy & Precautions, NPO status , Patient's Chart, lab work & pertinent test results  History of Anesthesia Complications Negative for: history of anesthetic complications  Airway Mallampati: II  TM Distance: >3 FB Neck ROM: Full    Dental  (+) Dental Advisory Given, Chipped   Pulmonary sleep apnea and Continuous Positive Airway Pressure Ventilation , former smoker,    breath sounds clear to auscultation       Cardiovascular hypertension, Pt. on medications (-) angina Rhythm:Regular Rate:Normal  '17 ECHO: moderateconcentric hypertrophy. Systolic function was normal, EF 55% to 60%. Wallmotion was normal, no regional wall motion abnormalities, grade 1 DD, no significant valvular abnormalities   Neuro/Psych TIA   GI/Hepatic negative GI ROS, Neg liver ROS,   Endo/Other  Morbid obesity  Renal/GU negative Renal ROS   Bladder tumor    Musculoskeletal   Abdominal (+) + obese,   Peds  Hematology negative hematology ROS (+)   Anesthesia Other Findings   Reproductive/Obstetrics                            Anesthesia Physical Anesthesia Plan  ASA: 3  Anesthesia Plan: General   Post-op Pain Management: Tylenol PO (pre-op)   Induction: Intravenous  PONV Risk Score and Plan: 2 and Ondansetron, Dexamethasone and Treatment may vary due to age or medical condition  Airway Management Planned: LMA  Additional Equipment: None  Intra-op Plan:   Post-operative Plan:   Informed Consent: I have reviewed the patients History and Physical, chart, labs and discussed the procedure including the risks, benefits and alternatives for the proposed anesthesia with the patient or authorized representative who has indicated his/her understanding and acceptance.     Dental advisory given  Plan Discussed  with: CRNA and Surgeon  Anesthesia Plan Comments:        Anesthesia Quick Evaluation

## 2021-11-14 NOTE — Transfer of Care (Signed)
Immediate Anesthesia Transfer of Care Note  Patient: Timothy Mcdaniel  Procedure(s) Performed: TRANSURETHRAL RESECTION OF SMALL BLADDER TUMOR (TURBT) BILATERAL RETROGRADE/POST OPERATIVE INSTILLATION OF GEMCITABINE  Patient Location: PACU  Anesthesia Type:General  Level of Consciousness: awake, alert  and oriented  Airway & Oxygen Therapy: Patient Spontanous Breathing and Patient connected to face mask oxygen  Post-op Assessment: Report given to RN and Post -op Vital signs reviewed and stable  Post vital signs: Reviewed and stable  Last Vitals:  Vitals Value Taken Time  BP    Temp    Pulse    Resp    SpO2      Last Pain:  Vitals:   11/14/21 0716  TempSrc:   PainSc: 0-No pain      Patients Stated Pain Goal: 3 (27/51/70 0174)  Complications: No notable events documented.

## 2021-11-14 NOTE — Anesthesia Procedure Notes (Signed)
Procedure Name: LMA Insertion Date/Time: 11/14/2021 8:53 AM Performed by: Lieutenant Diego, CRNA Pre-anesthesia Checklist: Patient identified, Emergency Drugs available, Suction available and Patient being monitored Patient Re-evaluated:Patient Re-evaluated prior to induction Oxygen Delivery Method: Circle system utilized Preoxygenation: Pre-oxygenation with 100% oxygen Induction Type: IV induction Ventilation: Mask ventilation without difficulty LMA: LMA inserted LMA Size: 5.0 Number of attempts: 1 Placement Confirmation: positive ETCO2 and breath sounds checked- equal and bilateral Tube secured with: Tape Dental Injury: Teeth and Oropharynx as per pre-operative assessment

## 2021-11-14 NOTE — Anesthesia Postprocedure Evaluation (Signed)
Anesthesia Post Note  Patient: Timothy Mcdaniel  Procedure(s) Performed: TRANSURETHRAL RESECTION OF SMALL BLADDER TUMOR (TURBT) BILATERAL RETROGRADE/POST OPERATIVE INSTILLATION OF GEMCITABINE     Patient location during evaluation: PACU Anesthesia Type: General Level of consciousness: awake and alert, patient cooperative and oriented Pain management: pain level controlled Vital Signs Assessment: post-procedure vital signs reviewed and stable Respiratory status: spontaneous breathing, nonlabored ventilation and respiratory function stable Cardiovascular status: blood pressure returned to baseline and stable Postop Assessment: no apparent nausea or vomiting and adequate PO intake Anesthetic complications: no   No notable events documented.  Last Vitals:  Vitals:   11/14/21 1006 11/14/21 1030  BP:  134/76  Pulse:  63  Resp:  20  Temp:    SpO2: 100% 93%    Last Pain:  Vitals:   11/14/21 1030  TempSrc:   PainSc: 0-No pain                 Eaton Folmar,E. Devinn Voshell

## 2021-11-15 ENCOUNTER — Encounter (HOSPITAL_COMMUNITY): Payer: Self-pay | Admitting: Urology

## 2021-11-17 LAB — SURGICAL PATHOLOGY

## 2021-12-01 ENCOUNTER — Encounter: Payer: Self-pay | Admitting: Urology

## 2021-12-01 ENCOUNTER — Ambulatory Visit (INDEPENDENT_AMBULATORY_CARE_PROVIDER_SITE_OTHER): Payer: Medicare Other | Admitting: Urology

## 2021-12-01 ENCOUNTER — Other Ambulatory Visit: Payer: Self-pay

## 2021-12-01 VITALS — BP 163/84 | HR 99 | Wt 277.0 lb

## 2021-12-01 DIAGNOSIS — R8271 Bacteriuria: Secondary | ICD-10-CM | POA: Diagnosis not present

## 2021-12-01 DIAGNOSIS — C672 Malignant neoplasm of lateral wall of bladder: Secondary | ICD-10-CM

## 2021-12-01 DIAGNOSIS — R310 Gross hematuria: Secondary | ICD-10-CM | POA: Diagnosis not present

## 2021-12-01 LAB — MICROSCOPIC EXAMINATION
RBC, Urine: >30 /hpf — AB (ref 0–2)
Renal Epithel, UA: NONE SEEN /hpf

## 2021-12-01 LAB — URINALYSIS, ROUTINE W REFLEX MICROSCOPIC
Bilirubin, UA: NEGATIVE
Ketones, UA: NEGATIVE
Nitrite, UA: NEGATIVE
Specific Gravity, UA: >1.03 — ABNORMAL HIGH (ref 1.005–1.030)
Urobilinogen, Ur: 1 mg/dL (ref 0.2–1.0)
pH, UA: 6 (ref 5.0–7.5)

## 2021-12-01 NOTE — Progress Notes (Signed)

## 2021-12-01 NOTE — Progress Notes (Signed)
12/01/2021 9:47 AM   Timothy Mcdaniel 1946-11-10 [id number]  Referring provider: Redmond School, MD [address] [place],  [place] [address]  Chief Complaint  Patient presents with   Other    Biopsy Results    HPI: F/U - Pt presents for Bladder bx results. Doing well and denies urinary sxs today. No gross hematuria. No abdominal or back pain.  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  He presented with gross hematuria. F/u TUR 12/22 benign.    Biopsy/TUR: August 2022-high-grade T1 right lateral and left up toward dome. 2 cm.  Gemcitabine instilled. Dec 2022 - TUR (benign no residual disease), bilat rgp (normal), DRE nl    Staging/imaging: June 2022 CT scan abdomen and pelvis-benign.  No bone lesions or lymphadenopathy.  Dec 2022 bilateral RGP -normal   He is well today.    PMH: Past Medical History:  Diagnosis Date   Arthritis    OA- both knees , L shoulder    Exogenous obesity    Hypercholesteremia    Hypertension    Nonischemic cardiomyopathy (HCC)    OA (osteoarthritis)    OSA on CPAP    Recurrent upper respiratory infection (URI)    DEC. had flu shot- followed by bad cold, treated /w OTC   Shortness of breath    Stroke [facility])    ministroke    Surgical History: Past Surgical History:  Procedure Laterality Date   COLONOSCOPY N/A 02/09/2014   Procedure: COLONOSCOPY;  Surgeon: Daneil Dolin, MD;  [facility];  Service: Endoscopy;  Laterality: N/A;  11:30 AM   EYE SURGERY     cataracts   MANDIBLE FRACTURE SURGERY     1960's - occupational injury   TOTAL KNEE ARTHROPLASTY  02/15/2012   Procedure: TOTAL KNEE ARTHROPLASTY;  Surgeon: Rudean Haskell, MD;  Location: [place];  Service: Orthopedics;  Laterality: Left;   TRANSTHORACIC ECHOCARDIOGRAM  08/03/2011   EF 69%; LV systolic funtion mildly reduced; trace MR, mild TR   TRANSURETHRAL RESECTION OF BLADDER TUMOR N/A 08/12/2021   Procedure: TRANSURETHRAL RESECTION  OF BLADDER TUMOR (TURBT) WITH POST OPERATIVE INSTILLATION OF GEMCITABINE;  Surgeon: [name], MD;  [facility];  Service: Urology;  Laterality: N/A;   TRANSURETHRAL RESECTION OF BLADDER TUMOR N/A 11/14/2021   Procedure: TRANSURETHRAL RESECTION OF SMALL BLADDER TUMOR (TURBT) BILATERAL RETROGRADE/POST OPERATIVE INSTILLATION OF GEMCITABINE;  Surgeon: [name], MD;  [facility];  Service: Urology;  Laterality: N/A;    Home Medications:  Allergies as of 12/01/2021   No Known Allergies      Medication List        Accurate as of December 01, 2021  9:47 AM. If you have any questions, ask your nurse or doctor.          aspirin 81 MG tablet Take 81 mg by mouth daily.   Biofreeze 4 % Gel Generic drug: Menthol (Topical Analgesic) Apply 1 application topically daily as needed (pain).   CAL-MAG-ZINC PO Take 1 tablet by mouth daily.   CENTRUM SILVER ADULT 50+ PO Take 1 tablet by mouth daily.   diclofenac Sodium 1 % Gel Commonly known as: VOLTAREN Apply 1 application topically daily as needed (pain).   dorzolamide-timolol 22.3-6.8 MG/ML ophthalmic solution Commonly known as: COSOPT Place 1 drop into the right eye 2 (two) times daily.   fenofibrate 48 MG tablet Commonly known as: TRICOR TAKE ONE TABLET BY MOUTH ONCE DAILY   hydrochlorothiazide  12.5 MG tablet Commonly known as: HYDRODIURIL Take 12.5 mg by mouth daily.   KRILL OIL OMEGA-3 PO Take 500 mg by mouth daily.   lisinopril 40 MG tablet Commonly known as: ZESTRIL Take 40 mg by mouth daily.   oxymetazoline 0.05 % nasal spray Commonly known as: AFRIN Place 1 spray into both nostrils 2 (two) times daily as needed for congestion.   traMADol 50 MG tablet Commonly known as: ULTRAM Take 50 mg by mouth every 6 (six) hours as needed for severe pain.   traZODone 100 MG tablet Commonly known as: DESYREL Take 100 mg by mouth at bedtime.   Vitamin D3 125 MCG (5000 UT) Caps Take 5,000 Units by  mouth 2 (two) times a week. Tues and Fri        Allergies: No Known Allergies  Family History: Family History  Problem Relation Age of Onset   Heart attack Mother    Cancer Father    Kidney disease Brother    Anesthesia problems Neg Hx    Hypotension Neg Hx    Malignant hyperthermia Neg Hx    Pseudochol deficiency Neg Hx    Colon cancer Neg Hx     Social History:  reports that he quit smoking about 27 years ago. His smoking use included cigarettes. He has a 15.00 pack-year smoking history. He has never used smokeless tobacco. He reports current alcohol use. He reports that he does not use drugs.   Physical Exam: There were no vitals taken for this visit.  Constitutional:  Alert and oriented, No acute distress. HEENT: [place] AT Trachea midline, no masses. Cardiovascular: No clubbing, cyanosis, or edema. Respiratory: Normal respiratory effort, no increased work of breathing. GI: Abdomen is distended Skin: No rashes, bruises or suspicious lesions. Neurologic: Grossly intact, no focal deficits, moving all 4 extremities. Psychiatric: Normal mood and affect.  Laboratory Data: Lab Results  Component Value Date   WBC 5.0 11/10/2021   HGB 13.7 11/10/2021   HCT 42.4 11/10/2021   MCV 93.6 11/10/2021   PLT 243 11/10/2021    Lab Results  Component Value Date   CREATININE 0.88 11/10/2021    No results found for: PSA  No results found for: TESTOSTERONE  No results found for: HGBA1C  Urinalysis    Component Value Date/Time   COLORURINE YELLOW 02/04/2012 1221   APPEARANCEUR Clear 09/29/2021 1141   LABSPEC 1.017 02/04/2012 1221   PHURINE 8.0 02/04/2012 1221   GLUCOSEU Negative 09/29/2021 1141   HGBUR NEGATIVE 02/04/2012 1221   BILIRUBINUR Negative 09/29/2021 1141   KETONESUR NEGATIVE 02/04/2012 1221   PROTEINUR Trace (A) 09/29/2021 1141   PROTEINUR NEGATIVE 02/04/2012 1221   UROBILINOGEN 1.0 02/04/2012 1221   NITRITE Negative 09/29/2021 1141   NITRITE NEGATIVE  02/04/2012 1221   LEUKOCYTESUR 1+ (A) 09/29/2021 1141    Lab Results  Component Value Date   LABMICR See below: 09/29/2021   WBCUA 6-10 (A) 09/29/2021   LABEPIT 0-10 09/29/2021   MUCUS Present 09/29/2021   BACTERIA Few 09/29/2021    Pertinent Imaging:    Assessment & Plan:    1. Bladder cancer - HGT1 disease with f/u TUR December 2022 benign.  I recommended BCG-we went over the nature risk benefits and alternatives to BCG and discussed fever and sepsis among others.     No follow-ups on file.  [name], MD  [facility]  [address] [place], [place] [address] [phone]

## 2021-12-03 LAB — URINE CULTURE

## 2021-12-17 ENCOUNTER — Other Ambulatory Visit: Payer: Self-pay

## 2021-12-17 ENCOUNTER — Ambulatory Visit: Payer: Medicare Other

## 2021-12-17 DIAGNOSIS — R310 Gross hematuria: Secondary | ICD-10-CM | POA: Diagnosis not present

## 2021-12-17 LAB — URINALYSIS, ROUTINE W REFLEX MICROSCOPIC
Bilirubin, UA: NEGATIVE
Glucose, UA: NEGATIVE
Ketones, UA: NEGATIVE
Nitrite, UA: NEGATIVE
Specific Gravity, UA: 1.025 (ref 1.005–1.030)
Urobilinogen, Ur: 0.2 mg/dL (ref 0.2–1.0)
pH, UA: 6 (ref 5.0–7.5)

## 2021-12-17 LAB — MICROSCOPIC EXAMINATION: Renal Epithel, UA: NONE SEEN /hpf

## 2021-12-17 MED ORDER — BCG LIVE 50 MG IS SUSR
3.2400 mL | Freq: Once | INTRAVESICAL | Status: AC
  Administered 2021-12-17: 81 mg via INTRAVESICAL

## 2021-12-17 NOTE — Progress Notes (Unsigned)
BCG Bladder Instillation  BCG # 1 of 6  Due to Bladder Cancer patient is present today for a BCG treatment. Patient was cleaned and prepped in a sterile fashion with betadine. A 14FR catheter was inserted, urine return was noted .46ml, urine was Yellow in color.  45ml of reconstituted BCG was instilled into the bladder. The catheter was then removed. Patient tolerated well, no complications were noted  Performed by: Janett Billow CMA

## 2021-12-17 NOTE — Patient Instructions (Signed)

## 2022-01-07 ENCOUNTER — Ambulatory Visit (INDEPENDENT_AMBULATORY_CARE_PROVIDER_SITE_OTHER): Payer: Medicare Other

## 2022-01-07 ENCOUNTER — Other Ambulatory Visit: Payer: Self-pay

## 2022-01-07 DIAGNOSIS — C672 Malignant neoplasm of lateral wall of bladder: Secondary | ICD-10-CM | POA: Diagnosis not present

## 2022-01-07 LAB — MICROSCOPIC EXAMINATION
Bacteria, UA: NONE SEEN
Renal Epithel, UA: NONE SEEN /hpf

## 2022-01-07 LAB — URINALYSIS, ROUTINE W REFLEX MICROSCOPIC
Bilirubin, UA: NEGATIVE
Glucose, UA: NEGATIVE
Ketones, UA: NEGATIVE
Nitrite, UA: NEGATIVE
Specific Gravity, UA: 1.025 (ref 1.005–1.030)
Urobilinogen, Ur: 0.2 mg/dL (ref 0.2–1.0)
pH, UA: 6 (ref 5.0–7.5)

## 2022-01-07 MED ORDER — BCG LIVE 50 MG IS SUSR
3.2400 mL | Freq: Once | INTRAVESICAL | Status: AC
  Administered 2022-01-07: 12:00:00 81 mg via INTRAVESICAL

## 2022-01-07 NOTE — Progress Notes (Signed)
BCG Bladder Instillation  BCG # 2 of 6  Due to Bladder Cancer patient is present today for a BCG treatment. Patient was cleaned and prepped in a sterile fashion with betadine. A 14FR catheter was inserted, urine return was noted 81ml, urine was yellow in color.  85ml of reconstituted BCG was instilled into the bladder. The catheter was then removed. Patient tolerated well, no complications were noted  Performed by: Estill Bamberg RN  Follow up/ Additional notes: weekly BCG treatment

## 2022-01-09 DIAGNOSIS — J329 Chronic sinusitis, unspecified: Secondary | ICD-10-CM | POA: Diagnosis not present

## 2022-01-12 ENCOUNTER — Ambulatory Visit (INDEPENDENT_AMBULATORY_CARE_PROVIDER_SITE_OTHER): Payer: Medicare Other | Admitting: Physician Assistant

## 2022-01-12 ENCOUNTER — Other Ambulatory Visit: Payer: Self-pay

## 2022-01-12 VITALS — BP 155/79 | HR 87 | Wt 299.0 lb

## 2022-01-12 DIAGNOSIS — R350 Frequency of micturition: Secondary | ICD-10-CM | POA: Diagnosis not present

## 2022-01-12 DIAGNOSIS — C672 Malignant neoplasm of lateral wall of bladder: Secondary | ICD-10-CM

## 2022-01-12 LAB — MICROSCOPIC EXAMINATION
Bacteria, UA: NONE SEEN
Epithelial Cells (non renal): NONE SEEN /hpf (ref 0–10)
RBC, Urine: NONE SEEN /hpf (ref 0–2)
Renal Epithel, UA: NONE SEEN /hpf
WBC, UA: NONE SEEN /hpf (ref 0–5)

## 2022-01-12 LAB — URINALYSIS, ROUTINE W REFLEX MICROSCOPIC
Bilirubin, UA: NEGATIVE
Ketones, UA: NEGATIVE
Leukocytes,UA: NEGATIVE
Nitrite, UA: NEGATIVE
RBC, UA: NEGATIVE
Specific Gravity, UA: >1.03 — ABNORMAL HIGH (ref 1.005–1.030)
Urobilinogen, Ur: 0.2 mg/dL (ref 0.2–1.0)
pH, UA: 5.5 (ref 5.0–7.5)

## 2022-01-12 MED ORDER — BCG LIVE 50 MG IS SUSR
3.2400 mL | Freq: Once | INTRAVESICAL | Status: AC
  Administered 2022-01-12: 81 mg via INTRAVESICAL

## 2022-01-12 NOTE — Progress Notes (Signed)
Created in error

## 2022-01-12 NOTE — Patient Instructions (Signed)

## 2022-01-12 NOTE — Progress Notes (Signed)
Assessment: 1. Malignant neoplasm of lateral wall of urinary bladder (HCC)   2. Urinary frequency     Plan: BCG treatment today and will f/u appt next week for BCG 4/6.   Chief Complaint: BCG 3/6  HPI: 01/12/22 Timothy Mcdaniel is a 76 y.o. male who presents for continued evaluation of bladder cancer. He is scheduled for BCG treatment 3/6 today. He has no c/o dysuria, burning, urgency, or hematuria, but has had increase in frequency since last treatment. Pt currently on Amoxicillin and prednisone for sinusitis.  UA clear today.   12/01/21 F/U - Pt presents for Bladder bx results. Doing well and denies urinary sxs today. No gross hematuria. No abdominal or back pain.   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  He presented with gross hematuria. F/u TUR 12/22 benign.    Biopsy/TUR: August 2022-high-grade T1 right lateral and left up toward dome. 2 cm.  Gemcitabine instilled. Dec 2022 - TUR (benign no residual disease), bilat rgp (normal), DRE nl    Staging/imaging: June 2022 CT scan abdomen and pelvis-benign.  No bone lesions or lymphadenopathy.  Dec 2022 bilateral RGP -normal    He is well today.   Portions of the above documentation were copied from a prior visit for review purposes only.  Allergies: No Known Allergies  PMH: Past Medical History:  Diagnosis Date   Arthritis    OA- both knees , L shoulder    Exogenous obesity    Hypercholesteremia    Hypertension    Nonischemic cardiomyopathy (HCC)    OA (osteoarthritis)    OSA on CPAP    Recurrent upper respiratory infection (URI)    DEC. had flu shot- followed by bad cold, treated /w OTC   Shortness of breath    Stroke ([employer])    ministroke    PSH: Past Surgical History:  Procedure Laterality Date   COLONOSCOPY N/A 02/09/2014   Procedure: COLONOSCOPY;  Surgeon: Daneil Dolin, MD;  [facility];  Service: Endoscopy;  Laterality: N/A;  11:30 AM   EYE SURGERY      cataracts   MANDIBLE FRACTURE SURGERY     1960's - occupational injury   TOTAL KNEE ARTHROPLASTY  02/15/2012   Procedure: TOTAL KNEE ARTHROPLASTY;  Surgeon: Rudean Haskell, MD;  Location: [place];  Service: Orthopedics;  Laterality: Left;   TRANSTHORACIC ECHOCARDIOGRAM  08/03/2011   EF 02%; LV systolic funtion mildly reduced; trace MR, mild TR   TRANSURETHRAL RESECTION OF BLADDER TUMOR N/A 08/12/2021   Procedure: TRANSURETHRAL RESECTION OF BLADDER TUMOR (TURBT) WITH POST OPERATIVE INSTILLATION OF GEMCITABINE;  Surgeon: [name], MD;  [facility];  Service: Urology;  Laterality: N/A;   TRANSURETHRAL RESECTION OF BLADDER TUMOR N/A 11/14/2021   Procedure: TRANSURETHRAL RESECTION OF SMALL BLADDER TUMOR (TURBT) BILATERAL RETROGRADE/POST OPERATIVE INSTILLATION OF GEMCITABINE;  Surgeon: [name], MD;  [facility];  Service: Urology;  Laterality: N/A;    SH: Social History   Tobacco Use   Smoking status: Former    Packs/day: 0.50    Years: 30.00    Pack years: 15.00    Types: Cigarettes    Quit date: 03/29/1994    Years since quitting: 27.8   Smokeless tobacco: Never  Vaping Use   Vaping Use: Never used  Substance Use Topics   Alcohol use: Yes    Comment: rare    one time a month   Drug use: No  ROS: Constitutional:  Negative for fever, chills, weight loss CV: Negative for chest pain, previous MI, hypertension Respiratory:  Negative for shortness of breath, wheezing, sleep apnea, frequent cough GI:  Negative for nausea, vomiting, bloody stool, GERD  PE: BP (!) 155/79    Pulse 87    Wt 299 lb (135.6 kg)    BMI 41.70 kg/m  GENERAL APPEARANCE:  Well appearing, well developed, well nourished, NAD HEENT:  Atraumatic, normocephalic NECK:  Supple. Trachea midline ABDOMEN:  Soft, non-tender, no masses EXTREMITIES:  Moves all extremities well, without clubbing, cyanosis, or edema NEUROLOGIC:  Alert and oriented x 3, normal gait, CN II-XII grossly  intact MENTAL STATUS:  appropriate SKIN:  Warm, dry, and intact   Results: Laboratory Data: Lab Results  Component Value Date   WBC 5.0 11/10/2021   HGB 13.7 11/10/2021   HCT 42.4 11/10/2021   MCV 93.6 11/10/2021   PLT 243 11/10/2021    Lab Results  Component Value Date   CREATININE 0.88 11/10/2021     Urinalysis    Component Value Date/Time   COLORURINE YELLOW 02/04/2012 1221   APPEARANCEUR Clear 01/07/2022 1206   LABSPEC 1.017 02/04/2012 1221   PHURINE 8.0 02/04/2012 1221   GLUCOSEU Negative 01/07/2022 1206   HGBUR NEGATIVE 02/04/2012 1221   BILIRUBINUR Negative 01/07/2022 1206   KETONESUR NEGATIVE 02/04/2012 1221   PROTEINUR 1+ (A) 01/07/2022 1206   PROTEINUR NEGATIVE 02/04/2012 1221   UROBILINOGEN 1.0 02/04/2012 1221   NITRITE Negative 01/07/2022 1206   NITRITE NEGATIVE 02/04/2012 1221   LEUKOCYTESUR Trace (A) 01/07/2022 1206    Lab Results  Component Value Date   LABMICR See below: 01/07/2022   WBCUA 6-10 (A) 01/07/2022   LABEPIT 0-10 01/07/2022   MUCUS Present 01/07/2022   BACTERIA None seen 01/07/2022    Pertinent Imaging:  No results found for this or any previous visit (from the past 24 hour(s)).

## 2022-01-12 NOTE — Progress Notes (Signed)
BCG Bladder Instillation  BCG # 3 of 6  Due to Bladder Cancer patient is present today for a BCG treatment. Patient was cleaned and prepped in a sterile fashion with betadine. A 14FR catheter was inserted, urine return was noted 5ml, urine was yellow in color.  70ml of reconstituted BCG was instilled into the bladder. The catheter was then removed. Patient tolerated well, no complications were noted  Performed by: Saadiya Wilfong LPN  Follow up/ Additional notes: Keep next scheduled [place]

## 2022-01-14 ENCOUNTER — Ambulatory Visit: Payer: Medicare Other

## 2022-01-21 ENCOUNTER — Other Ambulatory Visit: Payer: Self-pay

## 2022-01-21 ENCOUNTER — Encounter: Payer: Self-pay | Admitting: Physician Assistant

## 2022-01-21 ENCOUNTER — Ambulatory Visit (INDEPENDENT_AMBULATORY_CARE_PROVIDER_SITE_OTHER): Payer: Medicare Other | Admitting: Physician Assistant

## 2022-01-21 VITALS — BP 121/78 | HR 78 | Wt 299.0 lb

## 2022-01-21 DIAGNOSIS — R310 Gross hematuria: Secondary | ICD-10-CM | POA: Diagnosis not present

## 2022-01-21 DIAGNOSIS — R350 Frequency of micturition: Secondary | ICD-10-CM

## 2022-01-21 LAB — URINALYSIS, ROUTINE W REFLEX MICROSCOPIC
Bilirubin, UA: NEGATIVE
Glucose, UA: NEGATIVE
Ketones, UA: NEGATIVE
Leukocytes,UA: NEGATIVE
Nitrite, UA: NEGATIVE
RBC, UA: NEGATIVE
Specific Gravity, UA: 1.025 (ref 1.005–1.030)
Urobilinogen, Ur: 0.2 mg/dL (ref 0.2–1.0)
pH, UA: 6 (ref 5.0–7.5)

## 2022-01-21 MED ORDER — BCG LIVE 50 MG IS SUSR
3.2400 mL | Freq: Once | INTRAVESICAL | Status: AC
  Administered 2022-01-21: 81 mg via INTRAVESICAL

## 2022-01-21 NOTE — Progress Notes (Signed)
Assessment: 1. Urinary frequency   2. Gross hematuria     Plan: Follow-up for next BCG as scheduled and will plan cystoscopic exam with Dr. Junious Silk 6 to 8 weeks after final installation.  Chief Complaint: No chief complaint on file. BCG Tx 4/6  HPI: 01/21/22 Timothy Mcdaniel is a 76 y.o. male who presents for BCG tx 4/6 for continued treatment of bladder CA. He states he is doing well and tolerated last tx without difficulty. No hematuria. No c/o LUTS UA clear today  12/01/21 F/U - Pt presents for Bladder bx results. Doing well and denies urinary sxs today. No gross hematuria. No abdominal or back pain.    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  He presented with gross hematuria. F/u TUR 12/22 benign.    Biopsy/TUR: August 2022-high-grade T1 right lateral and left up toward dome. 2 cm.  Gemcitabine instilled. Dec 2022 - TUR (benign no residual disease), bilat rgp (normal), DRE nl    Staging/imaging: June 2022 CT scan abdomen and pelvis-benign.  No bone lesions or lymphadenopathy.  Dec 2022 bilateral RGP -normal    Portions of the above documentation were copied from a prior visit for review purposes only.  Allergies: No Known Allergies  PMH: Past Medical History:  Diagnosis Date   Arthritis    OA- both knees , L shoulder    Exogenous obesity    Hypercholesteremia    Hypertension    Nonischemic cardiomyopathy (HCC)    OA (osteoarthritis)    OSA on CPAP    Recurrent upper respiratory infection (URI)    DEC. had flu shot- followed by bad cold, treated /w OTC   Shortness of breath    Stroke ([place])    ministroke    PSH: Past Surgical History:  Procedure Laterality Date   COLONOSCOPY N/A 02/09/2014   Procedure: COLONOSCOPY;  Surgeon: Daneil Dolin, MD;  [facility];  Service: Endoscopy;  Laterality: N/A;  11:30 AM   EYE SURGERY     cataracts   MANDIBLE FRACTURE SURGERY     1960's - occupational injury   TOTAL  KNEE ARTHROPLASTY  02/15/2012   Procedure: TOTAL KNEE ARTHROPLASTY;  Surgeon: Rudean Haskell, MD;  Location: [place];  Service: Orthopedics;  Laterality: Left;   TRANSTHORACIC ECHOCARDIOGRAM  08/03/2011   EF 76%; LV systolic funtion mildly reduced; trace MR, mild TR   TRANSURETHRAL RESECTION OF BLADDER TUMOR N/A 08/12/2021   Procedure: TRANSURETHRAL RESECTION OF BLADDER TUMOR (TURBT) WITH POST OPERATIVE INSTILLATION OF GEMCITABINE;  Surgeon: [name], MD;  [facility];  Service: Urology;  Laterality: N/A;   TRANSURETHRAL RESECTION OF BLADDER TUMOR N/A 11/14/2021   Procedure: TRANSURETHRAL RESECTION OF SMALL BLADDER TUMOR (TURBT) BILATERAL RETROGRADE/POST OPERATIVE INSTILLATION OF GEMCITABINE;  Surgeon: [name], MD;  [facility];  Service: Urology;  Laterality: N/A;    SH: Social History   Tobacco Use   Smoking status: Former    Packs/day: 0.50    Years: 30.00    Pack years: 15.00    Types: Cigarettes    Quit date: 03/29/1994    Years since quitting: 27.8   Smokeless tobacco: Never  Vaping Use   Vaping Use: Never used  Substance Use Topics   Alcohol use: Yes    Comment: rare    one time a month   Drug use: No    ROS: Constitutional:  Negative for fever, chills, weight loss CV: Negative for chest  pain, previous MI, hypertension Respiratory:  Negative for shortness of breath, wheezing, sleep apnea, frequent cough GI:  Negative for nausea, vomiting, bloody stool, GERD  PE: BP 121/78    Pulse 78    Wt 299 lb (135.6 kg)    BMI 41.70 kg/m  GENERAL APPEARANCE:  Well appearing, well developed, well nourished, NAD HEENT:  Atraumatic, normocephalic NECK:  Supple. Trachea midline ABDOMEN:  Soft, non-tender, no masses EXTREMITIES:  Moves all extremities well, without clubbing, cyanosis, or edema NEUROLOGIC:  Alert and oriented x 3, normal gait, CN II-XII grossly intact MENTAL STATUS:  appropriate BACK:  Non-tender to palpation, No CVAT SKIN:  Warm, dry, and  intact   Results: Laboratory Data: Lab Results  Component Value Date   WBC 5.0 11/10/2021   HGB 13.7 11/10/2021   HCT 42.4 11/10/2021   MCV 93.6 11/10/2021   PLT 243 11/10/2021    Lab Results  Component Value Date   CREATININE 0.88 11/10/2021    No results found for: PSA  No results found for: TESTOSTERONE  No results found for: HGBA1C  Urinalysis    Component Value Date/Time   COLORURINE YELLOW 02/04/2012 1221   APPEARANCEUR Clear 01/12/2022 1119   LABSPEC 1.017 02/04/2012 1221   PHURINE 8.0 02/04/2012 1221   GLUCOSEU 2+ (A) 01/12/2022 1119   HGBUR NEGATIVE 02/04/2012 1221   BILIRUBINUR Negative 01/12/2022 1119   KETONESUR NEGATIVE 02/04/2012 1221   PROTEINUR 1+ (A) 01/12/2022 1119   PROTEINUR NEGATIVE 02/04/2012 1221   UROBILINOGEN 1.0 02/04/2012 1221   NITRITE Negative 01/12/2022 1119   NITRITE NEGATIVE 02/04/2012 1221   LEUKOCYTESUR Negative 01/12/2022 1119    Lab Results  Component Value Date   LABMICR See below: 01/12/2022   WBCUA None seen 01/12/2022   LABEPIT None seen 01/12/2022   MUCUS Present 01/12/2022   BACTERIA None seen 01/12/2022    Pertinent Imaging:   No results found for this or any previous visit (from the past 24 hour(s)).

## 2022-01-21 NOTE — Progress Notes (Signed)
BCG Bladder Instillation  BCG # 4 of 6  Due to Bladder Cancer patient is present today for a BCG treatment. Patient was cleaned and prepped in a sterile fashion with betadine. A 14FR catheter was inserted, urine return was noted 75ml, urine was yellow in color.  50ml of reconstituted BCG was instilled into the bladder. The catheter was then removed. Patient tolerated well, no complications were noted  Performed by: Estill Bamberg RN  Follow up/ Additional notes: weekly BCG

## 2022-01-21 NOTE — Patient Instructions (Signed)

## 2022-01-28 ENCOUNTER — Ambulatory Visit (INDEPENDENT_AMBULATORY_CARE_PROVIDER_SITE_OTHER): Payer: Medicare Other | Admitting: Physician Assistant

## 2022-01-28 ENCOUNTER — Encounter: Payer: Self-pay | Admitting: Physician Assistant

## 2022-01-28 ENCOUNTER — Other Ambulatory Visit: Payer: Self-pay

## 2022-01-28 VITALS — BP 148/72 | HR 79 | Wt 299.0 lb

## 2022-01-28 DIAGNOSIS — C672 Malignant neoplasm of lateral wall of bladder: Secondary | ICD-10-CM | POA: Diagnosis not present

## 2022-01-28 DIAGNOSIS — R310 Gross hematuria: Secondary | ICD-10-CM

## 2022-01-28 DIAGNOSIS — R350 Frequency of micturition: Secondary | ICD-10-CM

## 2022-01-28 LAB — URINALYSIS, ROUTINE W REFLEX MICROSCOPIC
Bilirubin, UA: NEGATIVE
Glucose, UA: NEGATIVE
Ketones, UA: NEGATIVE
Nitrite, UA: NEGATIVE
RBC, UA: NEGATIVE
Specific Gravity, UA: >1.01 (ref 1.005–1.030)
Urobilinogen, Ur: 0.2 mg/dL (ref 0.2–1.0)
pH, UA: 5.5 (ref 5.0–7.5)

## 2022-01-28 LAB — MICROSCOPIC EXAMINATION
Epithelial Cells (non renal): NONE SEEN /hpf (ref 0–10)
RBC, Urine: NONE SEEN /hpf (ref 0–2)
Renal Epithel, UA: NONE SEEN /hpf

## 2022-01-28 MED ORDER — BCG LIVE 50 MG IS SUSR
3.2400 mL | Freq: Once | INTRAVESICAL | Status: AC
  Administered 2022-01-28: 81 mg via INTRAVESICAL

## 2022-01-28 NOTE — Progress Notes (Signed)
BCG Bladder Instillation  BCG # 5 of 6  Due to Bladder Cancer patient is present today for a BCG treatment. Patient was cleaned and prepped in a sterile fashion with betadine. A 16FR catheter was inserted, urine return was noted 131ml, urine was yellow in color.  36ml of reconstituted BCG was instilled into the bladder. The catheter was then removed. Patient tolerated well, no complications were noted  Performed by: Janett Billow CMA  Follow up/ Additional notes: Next scheduled BCG

## 2022-01-29 NOTE — Progress Notes (Signed)
Assessment: 1. Malignant neoplasm of lateral wall of urinary bladder (HCC)   2. Urinary frequency     Plan: Final treatment is scheduled for next week.  He will then need follow-up cystoscopic exam in approximately 6 to 8 weeks with Dr. Junious Silk.  Chief Complaint: BCG treatment 5 of 6  HPI: 01/29/22 Timothy Mcdaniel is a 76 y.o. male who presents for continued evaluation of bladder CA.  He presents today for BCG treatment 5 of 6.  He states he is doing well without complaints.  No gross hematuria.  No complaint of LUTS.  UA = 6-10 white cells and few bacteria, nitrite negative, no RBCs  01/21/22 Timothy Mcdaniel is a 76 y.o. male who presents for BCG tx 4/6 for continued treatment of bladder CA. He states he is doing well and tolerated last tx without difficulty. No hematuria. No c/o LUTS UA clear today   12/01/21 F/U - Pt presents for Bladder bx results. Doing well and denies urinary sxs today. No gross hematuria. No abdominal or back pain.    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  He presented with gross hematuria. F/u TUR 12/22 benign.    Biopsy/TUR: August 2022-high-grade T1 right lateral and left up toward dome. 2 cm.  Gemcitabine instilled. Dec 2022 - TUR (benign no residual disease), bilat rgp (normal), DRE nl    Staging/imaging: June 2022 CT scan abdomen and pelvis-benign.  No bone lesions or lymphadenopathy.  Dec 2022 bilateral RGP -normal  Portions of the above documentation were copied from a prior visit for review purposes only.  Allergies: No Known Allergies  PMH: Past Medical History:  Diagnosis Date   Arthritis    OA- both knees , L shoulder    Exogenous obesity    Hypercholesteremia    Hypertension    Nonischemic cardiomyopathy (HCC)    OA (osteoarthritis)    OSA on CPAP    Recurrent upper respiratory infection (URI)    DEC. had flu shot- followed by bad cold, treated /w OTC   Shortness of breath    Stroke ([place])     ministroke    PSH: Past Surgical History:  Procedure Laterality Date   COLONOSCOPY N/A 02/09/2014   Procedure: COLONOSCOPY;  Surgeon: Daneil Dolin, MD;  [facility];  Service: Endoscopy;  Laterality: N/A;  11:30 AM   EYE SURGERY     cataracts   MANDIBLE FRACTURE SURGERY     1960's - occupational injury   TOTAL KNEE ARTHROPLASTY  02/15/2012   Procedure: TOTAL KNEE ARTHROPLASTY;  Surgeon: Rudean Haskell, MD;  Location: [place];  Service: Orthopedics;  Laterality: Left;   TRANSTHORACIC ECHOCARDIOGRAM  08/03/2011   EF 55%; LV systolic funtion mildly reduced; trace MR, mild TR   TRANSURETHRAL RESECTION OF BLADDER TUMOR N/A 08/12/2021   Procedure: TRANSURETHRAL RESECTION OF BLADDER TUMOR (TURBT) WITH POST OPERATIVE INSTILLATION OF GEMCITABINE;  Surgeon: [name], MD;  [facility];  Service: Urology;  Laterality: N/A;   TRANSURETHRAL RESECTION OF BLADDER TUMOR N/A 11/14/2021   Procedure: TRANSURETHRAL RESECTION OF SMALL BLADDER TUMOR (TURBT) BILATERAL RETROGRADE/POST OPERATIVE INSTILLATION OF GEMCITABINE;  Surgeon: [name], MD;  [facility];  Service: Urology;  Laterality: N/A;    SH: Social History   Tobacco Use   Smoking status: Former    Packs/day: 0.50    Years: 30.00    Pack years: 15.00    Types: Cigarettes    Quit  date: 03/29/1994    Years since quitting: 27.8   Smokeless tobacco: Never  Vaping Use   Vaping Use: Never used  Substance Use Topics   Alcohol use: Yes    Comment: rare    one time a month   Drug use: No    ROS: Constitutional:  Negative for fever, chills, weight loss CV: Negative for chest pain Respiratory:  Negative for shortness of breath, wheezing, sleep apnea, frequent cough GI:  Negative for nausea, vomiting, bloody stool, GERD  PE: BP (!) 148/72    Pulse 79    Wt 299 lb (135.6 kg)    BMI 41.70 kg/m  GENERAL APPEARANCE:  Well appearing, well developed, well nourished, NAD HEENT:  Atraumatic,  normocephalic NECK:  Supple. Trachea midline ABDOMEN:  Soft, non-tender, no masses EXTREMITIES:  Moves all extremities well, without clubbing, cyanosis, or edema NEUROLOGIC:  Alert and oriented x 3, normal gait, CN II-XII grossly intact MENTAL STATUS:  appropriate BACK:  Non-tender to palpation, No CVAT SKIN:  Warm, dry, and intact   Results: Laboratory Data: Lab Results  Component Value Date   WBC 5.0 11/10/2021   HGB 13.7 11/10/2021   HCT 42.4 11/10/2021   MCV 93.6 11/10/2021   PLT 243 11/10/2021    Lab Results  Component Value Date   CREATININE 0.88 11/10/2021   Urinalysis    Component Value Date/Time   COLORURINE YELLOW 02/04/2012 1221   APPEARANCEUR Clear 01/28/2022 1057   LABSPEC 1.017 02/04/2012 1221   PHURINE 8.0 02/04/2012 1221   GLUCOSEU Negative 01/28/2022 1057   HGBUR NEGATIVE 02/04/2012 1221   BILIRUBINUR Negative 01/28/2022 1057   KETONESUR NEGATIVE 02/04/2012 1221   PROTEINUR 1+ (A) 01/28/2022 1057   PROTEINUR NEGATIVE 02/04/2012 1221   UROBILINOGEN 1.0 02/04/2012 1221   NITRITE Negative 01/28/2022 1057   NITRITE NEGATIVE 02/04/2012 1221   LEUKOCYTESUR Trace (A) 01/28/2022 1057    Lab Results  Component Value Date   LABMICR See below: 01/28/2022   WBCUA 6-10 (A) 01/28/2022   LABEPIT None seen 01/28/2022   MUCUS Present 01/28/2022   BACTERIA Few 01/28/2022    Pertinent Imaging:    No results found for this or any previous visit (from the past 24 hour(s)).

## 2022-02-04 ENCOUNTER — Ambulatory Visit: Payer: Medicare Other | Admitting: Physician Assistant

## 2022-02-04 ENCOUNTER — Ambulatory Visit (INDEPENDENT_AMBULATORY_CARE_PROVIDER_SITE_OTHER): Payer: Medicare Other

## 2022-02-04 ENCOUNTER — Other Ambulatory Visit: Payer: Self-pay

## 2022-02-04 DIAGNOSIS — C672 Malignant neoplasm of lateral wall of bladder: Secondary | ICD-10-CM | POA: Diagnosis not present

## 2022-02-04 LAB — URINALYSIS, ROUTINE W REFLEX MICROSCOPIC
Bilirubin, UA: NEGATIVE
Glucose, UA: NEGATIVE
Ketones, UA: NEGATIVE
Nitrite, UA: NEGATIVE
RBC, UA: NEGATIVE
Specific Gravity, UA: 1.025 (ref 1.005–1.030)
Urobilinogen, Ur: 0.2 mg/dL (ref 0.2–1.0)
pH, UA: 6.5 (ref 5.0–7.5)

## 2022-02-04 LAB — MICROSCOPIC EXAMINATION
Epithelial Cells (non renal): NONE SEEN /hpf (ref 0–10)
RBC, Urine: NONE SEEN /hpf (ref 0–2)
Renal Epithel, UA: NONE SEEN /hpf

## 2022-02-04 MED ORDER — BCG LIVE 50 MG IS SUSR
3.2400 mL | Freq: Once | INTRAVESICAL | Status: AC
  Administered 2022-02-04: 81 mg via INTRAVESICAL

## 2022-02-04 NOTE — Patient Instructions (Signed)

## 2022-02-04 NOTE — Progress Notes (Signed)
BCG Bladder Instillation  BCG # 6 of 6  Due to Bladder Cancer patient is present today for a BCG treatment. Patient was cleaned and prepped in a sterile fashion with betadine. A 14FR catheter was inserted, urine return was noted 69ml, urine was yellow in color.  73ml of reconstituted BCG was instilled into the bladder. The catheter was then removed. Patient tolerated well, no complications were noted  Performed by: Estill Bamberg RN  Follow up/ Additional notes: f/u with MD

## 2022-02-05 ENCOUNTER — Ambulatory Visit (HOSPITAL_COMMUNITY)
Admission: RE | Admit: 2022-02-05 | Discharge: 2022-02-05 | Disposition: A | Discharge status: Home / Self Care | Payer: Medicare Other | Source: Ambulatory Visit | Attending: Internal Medicine | Admitting: Internal Medicine

## 2022-02-05 ENCOUNTER — Other Ambulatory Visit (HOSPITAL_COMMUNITY): Payer: Self-pay | Admitting: Internal Medicine

## 2022-02-05 DIAGNOSIS — R059 Cough, unspecified: Secondary | ICD-10-CM

## 2022-02-05 DIAGNOSIS — Z6841 Body Mass Index (BMI) 40.0 and over, adult: Secondary | ICD-10-CM | POA: Diagnosis not present

## 2022-02-05 DIAGNOSIS — T464X5A Adverse effect of angiotensin-converting-enzyme inhibitors, initial encounter: Secondary | ICD-10-CM | POA: Diagnosis not present

## 2022-02-05 DIAGNOSIS — R058 Other specified cough: Secondary | ICD-10-CM | POA: Diagnosis not present

## 2022-02-17 DIAGNOSIS — H401132 Primary open-angle glaucoma, bilateral, moderate stage: Secondary | ICD-10-CM | POA: Diagnosis not present

## 2022-03-16 ENCOUNTER — Encounter: Payer: Self-pay | Admitting: Urology

## 2022-03-16 ENCOUNTER — Ambulatory Visit (INDEPENDENT_AMBULATORY_CARE_PROVIDER_SITE_OTHER): Payer: Medicare Other | Admitting: Urology

## 2022-03-16 VITALS — BP 127/73 | HR 87

## 2022-03-16 DIAGNOSIS — C672 Malignant neoplasm of lateral wall of bladder: Secondary | ICD-10-CM

## 2022-03-16 LAB — URINALYSIS, ROUTINE W REFLEX MICROSCOPIC
Bilirubin, UA: NEGATIVE
Glucose, UA: NEGATIVE
Ketones, UA: NEGATIVE
Leukocytes,UA: NEGATIVE
Nitrite, UA: NEGATIVE
Protein,UA: NEGATIVE
RBC, UA: NEGATIVE
Specific Gravity, UA: 1.02 (ref 1.005–1.030)
Urobilinogen, Ur: 0.2 mg/dL (ref 0.2–1.0)
pH, UA: 7.5 (ref 5.0–7.5)

## 2022-03-16 MED ORDER — CIPROFLOXACIN HCL 500 MG PO TABS
500.0000 mg | ORAL_TABLET | Freq: Once | ORAL | Status: AC
  Administered 2022-03-16: 500 mg via ORAL

## 2022-03-16 NOTE — Progress Notes (Signed)
? ?03/16/2022 ?11:06 AM  ? ?Maggie Schwalbe ?[birth year] ?[id number] ? ?Referring provider: Redmond School, MD ?[address] ?[place],  [place] [address] ? ?No chief complaint on file. ? ? ?HPI: ? ?F/u -  ? ?1) bladder ca - pt dx with high-grade T1 bladder cancer August 2022.  He presented with gross hematuria. F/u TUR 12/22 benign.  ?  ?Biopsy/TUR: ?August 2022-high-grade T1 right lateral and left up toward dome. 2 cm.  Gemcitabine instilled. ?Dec 2022 - TUR (benign no residual disease), bilat rgp (normal), DRE nl  ?  ?Staging/imaging: ?June 2022 CT scan abdomen and pelvis-benign.  No bone lesions or lymphadenopathy. ? Dec 2022 bilateral RGP -normal  ? ?BCG: ?Induction x 19 Jan 2022  ? ?Today, seen for the above. He's been well without dysuria or gross hematuria. ? ? ?PMH: ?Past Medical History:  ?Diagnosis Date  ? Arthritis   ? OA- both knees , L shoulder   ? Exogenous obesity   ? Hypercholesteremia   ? Hypertension   ? Nonischemic cardiomyopathy ([place])   ? OA (osteoarthritis)   ? OSA on CPAP   ? Recurrent upper respiratory infection (URI)   ? DEC. had flu shot- followed by bad cold, treated /w OTC  ? Shortness of breath   ? Stroke [facility])   ? ministroke  ? ? ?Surgical History: ?Past Surgical History:  ?Procedure Laterality Date  ? COLONOSCOPY N/A 02/09/2014  ? Procedure: COLONOSCOPY;  Surgeon: Daneil Dolin, MD;  [facility];  Service: Endoscopy;  Laterality: N/A;  11:30 AM  ? EYE SURGERY    ? cataracts  ? MANDIBLE FRACTURE SURGERY    ? 1960's - occupational injury  ? TOTAL KNEE ARTHROPLASTY  02/15/2012  ? Procedure: TOTAL KNEE ARTHROPLASTY;  Surgeon: Rudean Haskell, MD;  Location: [place];  Service: Orthopedics;  Laterality: Left;  ? TRANSTHORACIC ECHOCARDIOGRAM  08/03/2011  ? EF 75%; LV systolic funtion mildly reduced; trace MR, mild TR  ? TRANSURETHRAL RESECTION OF BLADDER TUMOR N/A 08/12/2021  ? Procedure: TRANSURETHRAL RESECTION OF BLADDER TUMOR (TURBT) WITH POST OPERATIVE INSTILLATION OF  GEMCITABINE;  Surgeon: [name], MD;  [facility];  Service: Urology;  Laterality: N/A;  ? TRANSURETHRAL RESECTION OF BLADDER TUMOR N/A 11/14/2021  ? Procedure: TRANSURETHRAL RESECTION OF SMALL BLADDER TUMOR (TURBT) BILATERAL RETROGRADE/POST OPERATIVE INSTILLATION OF GEMCITABINE;  Surgeon: [name], MD;  [facility];  Service: Urology;  Laterality: N/A;  ? ? ?Home Medications:  ?Allergies as of 03/16/2022   ?No Known Allergies ?  ? ?  ?Medication List  ?  ? ?  ? Accurate as of March 16, 2022 11:06 AM. If you have any questions, ask your nurse or doctor.  ?  ?  ? ?  ? ?amLODipine 5 MG tablet ?Commonly known as: NORVASC ?Take 5 mg by mouth daily. ?  ?aspirin 81 MG tablet ?Take 81 mg by mouth daily. ?  ?Biofreeze 4 % Gel ?Generic drug: Menthol (Topical Analgesic) ?Apply 1 application topically daily as needed (pain). ?  ?CAL-MAG-ZINC PO ?Take 1 tablet by mouth daily. ?  ?CENTRUM SILVER ADULT 50+ PO ?Take 1 tablet by mouth daily. ?  ?Cetirizine HCl 10 MG Caps ?Take by mouth. ?  ?diclofenac Sodium 1 % Gel ?Commonly known as: VOLTAREN ?Apply 1 application topically daily as needed (pain). ?  ?dorzolamide-timolol 22.3-6.8 MG/ML ophthalmic solution ?Commonly known as: COSOPT ?Place 1 drop into the right eye 2 (two) times daily. ?  ?fenofibrate 48 MG tablet ?Commonly known  asIver Nestle ?TAKE ONE TABLET BY MOUTH ONCE DAILY ?  ?hydrochlorothiazide 12.5 MG tablet ?Commonly known as: HYDRODIURIL ?Take 12.5 mg by mouth daily. ?  ?KRILL OIL OMEGA-3 PO ?Take 500 mg by mouth daily. ?  ?lisinopril 40 MG tablet ?Commonly known as: ZESTRIL ?Take 40 mg by mouth daily. ?  ?oxymetazoline 0.05 % nasal spray ?Commonly known as: AFRIN ?Place 1 spray into both nostrils 2 (two) times daily as needed for congestion. ?  ?traMADol 50 MG tablet ?Commonly known as: ULTRAM ?Take 50 mg by mouth every 6 (six) hours as needed for severe pain. ?  ?traZODone 100 MG tablet ?Commonly known as: DESYREL ?Take 100 mg by mouth at  bedtime. ?  ?Vitamin D3 125 MCG (5000 UT) Caps ?Take 5,000 Units by mouth 2 (two) times a week. Tues and Fri ?  ? ?  ? ? ?Allergies: No Known Allergies ? ?Family History: ?Family History  ?Problem Relation Age of Onset  ? Heart attack Mother   ? Cancer Father   ? Kidney disease Brother   ? Anesthesia problems Neg Hx   ? Hypotension Neg Hx   ? Malignant hyperthermia Neg Hx   ? Pseudochol deficiency Neg Hx   ? Colon cancer Neg Hx   ? ? ?Social History:  reports that he quit smoking about 27 years ago. His smoking use included cigarettes. He has a 15.00 pack-year smoking history. He has never used smokeless tobacco. He reports current alcohol use. He reports that he does not use drugs. ? ? ?Physical Exam: ?Blood pressure 127/73, pulse 87. ?NED. A&Ox3.   ?No respiratory distress   ?Abd soft, NT, ND ?Normal phallus with bilateral descended testicles, scrotum normal, meatus normal ? ?Cystoscopy Procedure Note ? ?Patient identification was confirmed, informed consent was obtained, and patient was prepped using Betadine solution.  Lidocaine jelly was administered per urethral meatus.   ? ? ?Pre-Procedure: ?- Inspection reveals a normal caliber ureteral meatus. ? ?Procedure: ?The flexible cystoscope was introduced without difficulty ?- No urethral strictures/lesions are present. ?-Moderate hyperplasia, borderline obstructing prostate ?- Normal bladder neck ?- Bilateral ureteral orifices identified ?- Bladder mucosa  reveals no ulcers, tumors, or lesions -prior biopsy site right and left visualized ?- No bladder stones ?- No trabeculation ? ?Retroflexion shows normal bladder and bladder neck ? ? ?Post-Procedure: ?- Patient tolerated the procedure well ? ? ?Laboratory Data: ?Lab Results  ?Component Value Date  ? WBC 5.0 11/10/2021  ? HGB 13.7 11/10/2021  ? HCT 42.4 11/10/2021  ? MCV 93.6 11/10/2021  ? PLT 243 11/10/2021  ? ? ?Lab Results  ?Component Value Date  ? CREATININE 0.88 11/10/2021  ? ? ?No results found for: PSA ? ?No  results found for: TESTOSTERONE ? ?No results found for: HGBA1C ? ?Urinalysis ?   ?Component Value Date/Time  ? COLORURINE YELLOW 02/04/2012 1221  ? APPEARANCEUR Clear 02/04/2022 1149  ? LABSPEC 1.017 02/04/2012 1221  ? PHURINE 8.0 02/04/2012 1221  ? GLUCOSEU Negative 02/04/2022 1149  ? HGBUR NEGATIVE 02/04/2012 1221  ? BILIRUBINUR Negative 02/04/2022 1149  ? [place] NEGATIVE 02/04/2012 1221  ? PROTEINUR 1+ (A) 02/04/2022 1149  ? PROTEINUR NEGATIVE 02/04/2012 1221  ? UROBILINOGEN 1.0 02/04/2012 1221  ? NITRITE Negative 02/04/2022 1149  ? NITRITE NEGATIVE 02/04/2012 1221  ? LEUKOCYTESUR 1+ (A) 02/04/2022 1149  ? ? ?Lab Results  ?Component Value Date  ? LABMICR See below: 02/04/2022  ? WBCUA 11-30 (A) 02/04/2022  ? LABEPIT None seen 02/04/2022  ? MUCUS Present 02/04/2022  ?  BACTERIA Few 02/04/2022  ? ? ? ?Assessment & Plan:   ? ?1. Malignant neoplasm of lateral wall of urinary bladder (HCC) ?No evidence of disease.  Will need to follow-up for BCG maintenance #1 of 16 July 2022.  I will see him back in 3 months, July 2023, for cystoscopy. ?- Urinalysis, Routine w reflex microscopic ?- ciprofloxacin (CIPRO) tablet 500 mg ? ? ?No follow-ups on file. ? ?[name], MD ? ?[facility]  ?[place], [place] [address] ?[phone] ? ? ?

## 2022-04-01 DIAGNOSIS — R058 Other specified cough: Secondary | ICD-10-CM | POA: Diagnosis not present

## 2022-04-01 DIAGNOSIS — Z6841 Body Mass Index (BMI) 40.0 and over, adult: Secondary | ICD-10-CM | POA: Diagnosis not present

## 2022-04-01 DIAGNOSIS — Z1331 Encounter for screening for depression: Secondary | ICD-10-CM | POA: Diagnosis not present

## 2022-04-01 DIAGNOSIS — M159 Polyosteoarthritis, unspecified: Secondary | ICD-10-CM | POA: Diagnosis not present

## 2022-04-01 DIAGNOSIS — G4730 Sleep apnea, unspecified: Secondary | ICD-10-CM | POA: Diagnosis not present

## 2022-04-01 DIAGNOSIS — E559 Vitamin D deficiency, unspecified: Secondary | ICD-10-CM | POA: Diagnosis not present

## 2022-04-01 DIAGNOSIS — T464X5A Adverse effect of angiotensin-converting-enzyme inhibitors, initial encounter: Secondary | ICD-10-CM | POA: Diagnosis not present

## 2022-04-01 DIAGNOSIS — Z0001 Encounter for general adult medical examination with abnormal findings: Secondary | ICD-10-CM | POA: Diagnosis not present

## 2022-04-01 DIAGNOSIS — Z125 Encounter for screening for malignant neoplasm of prostate: Secondary | ICD-10-CM | POA: Diagnosis not present

## 2022-04-01 DIAGNOSIS — I10 Essential (primary) hypertension: Secondary | ICD-10-CM | POA: Diagnosis not present

## 2022-04-01 DIAGNOSIS — E538 Deficiency of other specified B group vitamins: Secondary | ICD-10-CM | POA: Diagnosis not present

## 2022-05-22 DIAGNOSIS — E6609 Other obesity due to excess calories: Secondary | ICD-10-CM | POA: Diagnosis not present

## 2022-05-22 DIAGNOSIS — G4730 Sleep apnea, unspecified: Secondary | ICD-10-CM | POA: Diagnosis not present

## 2022-05-22 DIAGNOSIS — L309 Dermatitis, unspecified: Secondary | ICD-10-CM | POA: Diagnosis not present

## 2022-05-22 DIAGNOSIS — Z6841 Body Mass Index (BMI) 40.0 and over, adult: Secondary | ICD-10-CM | POA: Diagnosis not present

## 2022-05-22 DIAGNOSIS — R7309 Other abnormal glucose: Secondary | ICD-10-CM | POA: Diagnosis not present

## 2022-05-22 DIAGNOSIS — I10 Essential (primary) hypertension: Secondary | ICD-10-CM | POA: Diagnosis not present

## 2022-06-29 ENCOUNTER — Ambulatory Visit (INDEPENDENT_AMBULATORY_CARE_PROVIDER_SITE_OTHER): Payer: Medicare Other | Admitting: Urology

## 2022-06-29 VITALS — BP 125/80 | HR 70 | Ht 71.0 in | Wt 260.0 lb

## 2022-06-29 DIAGNOSIS — C672 Malignant neoplasm of lateral wall of bladder: Secondary | ICD-10-CM

## 2022-06-29 DIAGNOSIS — Z8551 Personal history of malignant neoplasm of bladder: Secondary | ICD-10-CM

## 2022-06-29 LAB — URINALYSIS, ROUTINE W REFLEX MICROSCOPIC
Bilirubin, UA: NEGATIVE
Glucose, UA: NEGATIVE
Ketones, UA: NEGATIVE
Leukocytes,UA: NEGATIVE
Nitrite, UA: NEGATIVE
RBC, UA: NEGATIVE
Specific Gravity, UA: 1.02 (ref 1.005–1.030)
Urobilinogen, Ur: 1 mg/dL (ref 0.2–1.0)
pH, UA: 6 (ref 5.0–7.5)

## 2022-06-29 MED ORDER — CIPROFLOXACIN HCL 500 MG PO TABS
500.0000 mg | ORAL_TABLET | Freq: Once | ORAL | Status: AC
  Administered 2022-06-29: 500 mg via ORAL

## 2022-06-29 NOTE — Progress Notes (Signed)
   06/29/22  CC: No chief complaint on file.   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  He presented with gross hematuria. F/u TUR 12/22 benign.    Biopsy/TUR: August 2022-high-grade T1 right lateral and left up toward dome. 2 cm.  Gemcitabine instilled. Dec 2022 - TUR (benign no residual disease), bilat rgp (normal), DRE nl    Staging/imaging: June 2022 CT scan abdomen and pelvis-benign.  No bone lesions or lymphadenopathy. Dec 2022 bilateral RGP -normal    BCG: Induction x 19 Jan 2022    Today, seen for the above. He's been well without dysuria or gross hematuria.   There were no vitals taken for this visit. NED. A&Ox3.   No respiratory distress   Abd soft, NT, ND Normal phallus with bilateral descended testicles  Cystoscopy Procedure Note  Patient identification was confirmed, informed consent was obtained, and patient was prepped using Betadine solution.  Lidocaine jelly was administered per urethral meatus.     Pre-Procedure: - Inspection reveals a normal caliber ureteral meatus.  Procedure: The flexible cystoscope was introduced without difficulty - No urethral strictures/lesions are present. - moderate prostate enlargement with borderline obstruction  - normal bladder neck - Bilateral ureteral orifices identified - Bladder mucosa  reveals no ulcers, tumors, or lesions - No bladder stones - No trabeculation  Retroflexion shows normal bladder and bladder neck    Post-Procedure: - Patient tolerated the procedure well  Assessment/ Plan:  H/o bladder ca - he is due for BCG maintenance Aug 2023. See back for cystoscopy in 3 months. We went over the nature r/b/a to BCG.   No follow-ups on file.  [name], MD

## 2022-07-06 DIAGNOSIS — M25561 Pain in right knee: Secondary | ICD-10-CM | POA: Diagnosis not present

## 2022-07-20 ENCOUNTER — Ambulatory Visit (INDEPENDENT_AMBULATORY_CARE_PROVIDER_SITE_OTHER): Payer: Medicare Other | Admitting: Urology

## 2022-07-20 DIAGNOSIS — C672 Malignant neoplasm of lateral wall of bladder: Secondary | ICD-10-CM

## 2022-07-20 LAB — MICROSCOPIC EXAMINATION
Bacteria, UA: NONE SEEN
Epithelial Cells (non renal): NONE SEEN /hpf (ref 0–10)
RBC, Urine: NONE SEEN /hpf (ref 0–2)
Renal Epithel, UA: NONE SEEN /hpf
WBC, UA: NONE SEEN /hpf (ref 0–5)

## 2022-07-20 LAB — URINALYSIS, ROUTINE W REFLEX MICROSCOPIC
Bilirubin, UA: NEGATIVE
Glucose, UA: NEGATIVE
Ketones, UA: NEGATIVE
Leukocytes,UA: NEGATIVE
Nitrite, UA: NEGATIVE
RBC, UA: NEGATIVE
Specific Gravity, UA: 1.025 (ref 1.005–1.030)
Urobilinogen, Ur: 1 mg/dL (ref 0.2–1.0)
pH, UA: 5.5 (ref 5.0–7.5)

## 2022-07-20 MED ORDER — BCG LIVE 50 MG IS SUSR
3.2400 mL | Freq: Once | INTRAVESICAL | Status: AC
  Administered 2022-07-20: 81 mg via INTRAVESICAL

## 2022-07-20 NOTE — Progress Notes (Signed)
See LPN Cobb's note.

## 2022-07-20 NOTE — Patient Instructions (Signed)

## 2022-07-20 NOTE — Progress Notes (Signed)
BCG Bladder Instillation  BCG # 1 of 3  Due to Bladder Cancer patient is present today for a BCG treatment. Patient was cleaned and prepped in a sterile fashion with betadine. A 14FR catheter was inserted, urine return was noted 14m, urine was yellow in color.  5105mof reconstituted BCG was instilled into the bladder. The catheter was then removed. Patient tolerated well, no complications were noted  Performed by: Lindsi Bayliss LPN  Follow up/ Additional notes: Keep schedule NV

## 2022-07-21 DIAGNOSIS — H401132 Primary open-angle glaucoma, bilateral, moderate stage: Secondary | ICD-10-CM | POA: Diagnosis not present

## 2022-07-21 DIAGNOSIS — Z961 Presence of intraocular lens: Secondary | ICD-10-CM | POA: Diagnosis not present

## 2022-07-27 ENCOUNTER — Ambulatory Visit (INDEPENDENT_AMBULATORY_CARE_PROVIDER_SITE_OTHER): Payer: Medicare Other | Admitting: Physician Assistant

## 2022-07-27 DIAGNOSIS — C672 Malignant neoplasm of lateral wall of bladder: Secondary | ICD-10-CM | POA: Diagnosis not present

## 2022-07-27 MED ORDER — BCG LIVE 50 MG IS SUSR
3.2400 mL | Freq: Once | INTRAVESICAL | Status: AC
  Administered 2022-07-27: 81 mg via INTRAVESICAL

## 2022-07-27 NOTE — Progress Notes (Unsigned)
BCG Bladder Instillation  BCG # 2 of 3  Due to Bladder Cancer patient is present today for a BCG treatment. Patient was cleaned and prepped in a sterile fashion with betadine. A 14FR catheter was inserted, urine return was noted 40m, urine was yellow in color.  560mof reconstituted BCG was instilled into the bladder. The catheter was then removed. Patient tolerated well, no complications  Performed by: AmEstill Bamberg Follow up/ Additional notes: weekly bcg

## 2022-07-29 LAB — URINALYSIS, ROUTINE W REFLEX MICROSCOPIC
Bilirubin, UA: NEGATIVE
Glucose, UA: NEGATIVE
Leukocytes,UA: NEGATIVE
Nitrite, UA: NEGATIVE
RBC, UA: NEGATIVE
Specific Gravity, UA: 1.015 (ref 1.005–1.030)
Urobilinogen, Ur: 2 mg/dL — ABNORMAL HIGH (ref 0.2–1.0)
pH, UA: 7 (ref 5.0–7.5)

## 2022-07-30 NOTE — Progress Notes (Signed)
Assessment: 1. Malignant neoplasm of lateral wall of urinary bladder (HCC) - Urinalysis, Routine w reflex microscopic - bcg vaccine injection 81 mg - In and Out Cath    Plan: FU next week for maintenance BCG tx 3/3. Discussed expectations post tx today and pt will let us know if he has concerns before next week.  Chief Complaint: No chief complaint on file.   HPI: Timothy Mcdaniel is a 76 y.o. male who presents for continued evaluation of bladder cancer. He is scheduled for BCG tx 2/3 today and has tolerated treatments well. No gross hematuria  UA= nitrite negative without leukoesterase   Portions of the above documentation were copied from a prior visit for review purposes only.  Allergies: No Known Allergies  PMH: Past Medical History:  Diagnosis Date   Arthritis    OA- both knees , L shoulder    Exogenous obesity    Hypercholesteremia    Hypertension    Nonischemic cardiomyopathy (HCC)    OA (osteoarthritis)    OSA on CPAP    Recurrent upper respiratory infection (URI)    DEC. had flu shot- followed by bad cold, treated /w OTC   Shortness of breath    Stroke ([place])    ministroke    PSH: Past Surgical History:  Procedure Laterality Date   COLONOSCOPY N/A 02/09/2014   Procedure: COLONOSCOPY;  Surgeon: Daneil Dolin, MD;  [facility];  Service: Endoscopy;  Laterality: N/A;  11:30 AM   EYE SURGERY     cataracts   MANDIBLE FRACTURE SURGERY     1960's - occupational injury   TOTAL KNEE ARTHROPLASTY  02/15/2012   Procedure: TOTAL KNEE ARTHROPLASTY;  Surgeon: Rudean Haskell, MD;  Location: [place];  Service: Orthopedics;  Laterality: Left;   TRANSTHORACIC ECHOCARDIOGRAM  08/03/2011   EF 16%; LV systolic funtion mildly reduced; trace MR, mild TR   TRANSURETHRAL RESECTION OF BLADDER TUMOR N/A 08/12/2021   Procedure: TRANSURETHRAL RESECTION OF BLADDER TUMOR (TURBT) WITH POST OPERATIVE INSTILLATION OF GEMCITABINE;  Surgeon: [name], MD;   [facility];  Service: Urology;  Laterality: N/A;   TRANSURETHRAL RESECTION OF BLADDER TUMOR N/A 11/14/2021   Procedure: TRANSURETHRAL RESECTION OF SMALL BLADDER TUMOR (TURBT) BILATERAL RETROGRADE/POST OPERATIVE INSTILLATION OF GEMCITABINE;  Surgeon: [name], MD;  [facility];  Service: Urology;  Laterality: N/A;    SH: Social History   Tobacco Use   Smoking status: Former    Packs/day: 0.50    Years: 30.00    Total pack years: 15.00    Types: Cigarettes    Quit date: 03/29/1994    Years since quitting: 28.3   Smokeless tobacco: Never  Vaping Use   Vaping Use: Never used  Substance Use Topics   Alcohol use: Yes    Comment: rare    one time a month   Drug use: No    ROS: All other review of systems were reviewed and are negative except what is noted above in HPI  PE: There were no vitals taken for this visit. GENERAL APPEARANCE:  Well appearing, well developed, well nourished, NAD HEENT:  Atraumatic, normocephalic NECK:  Supple. Trachea midline Lungs: Unlabored respirations ABDOMEN:  Soft, non-tender, no masses  Results:   Urinalysis    Component Value Date/Time   COLORURINE YELLOW 02/04/2012 1221   APPEARANCEUR Cloudy (A) 07/27/2022 1552   LABSPEC 1.017 02/04/2012 1221   PHURINE 8.0 02/04/2012 1221   GLUCOSEU Negative 07/27/2022 1552   HGBUR  NEGATIVE 02/04/2012 1221   BILIRUBINUR Negative 07/27/2022 1552   KETONESUR NEGATIVE 02/04/2012 1221   PROTEINUR Trace (A) 07/27/2022 1552   PROTEINUR NEGATIVE 02/04/2012 1221   UROBILINOGEN 1.0 02/04/2012 1221   NITRITE Negative 07/27/2022 1552   NITRITE NEGATIVE 02/04/2012 1221   LEUKOCYTESUR Negative 07/27/2022 1552    Lab Results  Component Value Date   LABMICR See below: 07/20/2022   WBCUA None seen 07/20/2022   LABEPIT None seen 07/20/2022   MUCUS Present 07/20/2022   BACTERIA None seen 07/20/2022    Pertinent Imaging: No results found for this or any previous visit.  No results found  for this or any previous visit.  No results found for this or any previous visit.  No results found for this or any previous visit.  No results found for this or any previous visit.  No results found for this or any previous visit.  No results found for this or any previous visit.  No results found for this or any previous visit.  No results found for this or any previous visit (from the past 24 hour(s)).

## 2022-08-03 ENCOUNTER — Ambulatory Visit (INDEPENDENT_AMBULATORY_CARE_PROVIDER_SITE_OTHER): Payer: Medicare Other | Admitting: Urology

## 2022-08-03 DIAGNOSIS — C672 Malignant neoplasm of lateral wall of bladder: Secondary | ICD-10-CM

## 2022-08-03 LAB — MICROSCOPIC EXAMINATION
Bacteria, UA: NONE SEEN
Renal Epithel, UA: NONE SEEN /hpf

## 2022-08-03 LAB — URINALYSIS, ROUTINE W REFLEX MICROSCOPIC
Bilirubin, UA: NEGATIVE
Glucose, UA: NEGATIVE
Nitrite, UA: NEGATIVE
Specific Gravity, UA: 1.02 (ref 1.005–1.030)
Urobilinogen, Ur: 1 mg/dL (ref 0.2–1.0)
pH, UA: 6 (ref 5.0–7.5)

## 2022-08-03 MED ORDER — BCG LIVE 50 MG IS SUSR
3.2400 mL | Freq: Once | INTRAVESICAL | Status: AC
  Administered 2022-08-03: 81 mg via INTRAVESICAL

## 2022-08-03 NOTE — Progress Notes (Unsigned)
BCG Bladder Instillation  BCG # 3 of 3  Due to Bladder Cancer patient is present today for a BCG treatment. Patient was cleaned and prepped in a sterile fashion with betadine. A 14FR catheter was inserted, urine return was noted 14m, urine was yellow in color.  589mof reconstituted BCG was instilled into the bladder. The catheter was then removed. Patient tolerated well, no complications were noted  Performed by: KoLevi AlandCMA  Follow up/ Additional notes: Follow up as scheduled.

## 2022-08-27 DIAGNOSIS — M1711 Unilateral primary osteoarthritis, right knee: Secondary | ICD-10-CM | POA: Diagnosis not present

## 2022-08-27 DIAGNOSIS — M25561 Pain in right knee: Secondary | ICD-10-CM | POA: Diagnosis not present

## 2022-09-28 ENCOUNTER — Encounter: Payer: Self-pay | Admitting: Urology

## 2022-09-28 ENCOUNTER — Ambulatory Visit (INDEPENDENT_AMBULATORY_CARE_PROVIDER_SITE_OTHER): Payer: Medicare Other | Admitting: Urology

## 2022-09-28 VITALS — BP 113/72 | HR 89

## 2022-09-28 DIAGNOSIS — C672 Malignant neoplasm of lateral wall of bladder: Secondary | ICD-10-CM

## 2022-09-28 DIAGNOSIS — Z8551 Personal history of malignant neoplasm of bladder: Secondary | ICD-10-CM | POA: Diagnosis not present

## 2022-09-28 LAB — MICROSCOPIC EXAMINATION
Bacteria, UA: NONE SEEN
RBC, Urine: NONE SEEN /hpf (ref 0–2)

## 2022-09-28 LAB — URINALYSIS, ROUTINE W REFLEX MICROSCOPIC
Bilirubin, UA: NEGATIVE
Glucose, UA: NEGATIVE
Ketones, UA: NEGATIVE
Nitrite, UA: NEGATIVE
Protein,UA: NEGATIVE
RBC, UA: NEGATIVE
Specific Gravity, UA: 1.02 (ref 1.005–1.030)
Urobilinogen, Ur: 1 mg/dL (ref 0.2–1.0)
pH, UA: 7.5 (ref 5.0–7.5)

## 2022-09-28 MED ORDER — CIPROFLOXACIN HCL 500 MG PO TABS
500.0000 mg | ORAL_TABLET | Freq: Once | ORAL | Status: AC
  Administered 2022-09-28: 500 mg via ORAL

## 2022-09-28 NOTE — Progress Notes (Unsigned)
   09/28/22  CC: No chief complaint on file.   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  He presented with gross hematuria. F/u TUR 12/22 benign.    Biopsy/TUR: August 2022-high-grade T1 right lateral and left up toward dome. 2 cm.  Gemcitabine instilled. Dec 2022 - TUR (benign no residual disease), bilat rgp (normal), DRE nl    Staging/imaging: June 2022 CT scan abdomen and pelvis-benign.  No bone lesions or lymphadenopathy. Dec 2022 bilateral RGP -normal    BCG: Feb 2023 - Induction x 19 Jul 2022 - maintenance     Today, seen for the above. He is well. No dysuria or gross hematuria. AUASS = 11.    Blood pressure 113/72, pulse 89. NED. A&Ox3.   No respiratory distress   Abd soft, NT, ND Normal phallus with bilateral descended testicles  Cystoscopy Procedure Note  Patient identification was confirmed, informed consent was obtained, and patient was prepped using Betadine solution.  Lidocaine jelly was administered per urethral meatus.     Pre-Procedure: - Inspection reveals a normal caliber ureteral meatus.  Procedure: The flexible cystoscope was introduced without difficulty - No urethral strictures/lesions are present. -  -obstructing prostate with lateral lobes and small median lobe.  - normal bladder neck - Bilateral ureteral orifices identified - Bladder mucosa  reveals no ulcers, tumors, or lesions - No bladder stones - No trabeculation  Retroflexion shows normal bladder and bladder neck. Small median lobe.    Post-Procedure: - Patient tolerated the procedure well  Assessment/ Plan:   Bladder ca - see back in 3 mo for cystoscopy and BCG maintenance Feb 2024.    [name], MD

## 2022-10-26 DIAGNOSIS — M159 Polyosteoarthritis, unspecified: Secondary | ICD-10-CM | POA: Diagnosis not present

## 2022-10-26 DIAGNOSIS — E6609 Other obesity due to excess calories: Secondary | ICD-10-CM | POA: Diagnosis not present

## 2022-10-26 DIAGNOSIS — R7303 Prediabetes: Secondary | ICD-10-CM | POA: Diagnosis not present

## 2022-10-26 DIAGNOSIS — G4730 Sleep apnea, unspecified: Secondary | ICD-10-CM | POA: Diagnosis not present

## 2022-10-26 DIAGNOSIS — E119 Type 2 diabetes mellitus without complications: Secondary | ICD-10-CM | POA: Diagnosis not present

## 2022-10-26 DIAGNOSIS — Z6841 Body Mass Index (BMI) 40.0 and over, adult: Secondary | ICD-10-CM | POA: Diagnosis not present

## 2022-10-26 DIAGNOSIS — I10 Essential (primary) hypertension: Secondary | ICD-10-CM | POA: Diagnosis not present

## 2022-10-26 DIAGNOSIS — Z23 Encounter for immunization: Secondary | ICD-10-CM | POA: Diagnosis not present

## 2022-11-11 NOTE — Patient Instructions (Signed)
DUE TO COVID-19 ONLY TWO VISITORS  (aged 77 and older)  ARE ALLOWED TO COME WITH YOU AND STAY IN THE WAITING ROOM ONLY DURING PRE OP AND PROCEDURE.   **NO VISITORS ARE ALLOWED IN THE SHORT STAY AREA OR RECOVERY ROOM!!**  IF YOU WILL BE ADMITTED INTO THE HOSPITAL YOU ARE ALLOWED ONLY FOUR SUPPORT PEOPLE DURING VISITATION HOURS ONLY (7 AM -8PM)   The support person(s) must pass our screening, gel in and out, and wear a mask at all times, including in the patient's room. Patients must also wear a mask when staff or their support person are in the room. Visitors GUEST BADGE MUST BE WORN VISIBLY  One adult visitor may remain with you overnight and MUST be in the room by 8 P.M.     Your procedure is scheduled on: 11/23/22   Report to [facility] Main Entrance    Report to admitting at : 11:15 AM   Call this number if you have problems the morning of surgery [phone]   Do not eat food :After Midnight.   After Midnight you may have the following liquids until: 10:30 AM DAY OF SURGERY  Water Black Coffee (sugar ok, NO MILK/CREAM OR CREAMERS)  Tea (sugar ok, NO MILK/CREAM OR CREAMERS) regular and decaf                             Plain Jell-O (NO RED)                                           Fruit ices (not with fruit pulp, NO RED)                                     Popsicles (NO RED)                                                                  Juice: apple, WHITE grape, WHITE cranberry Sports drinks like Gatorade (NO RED)   The day of surgery:  Drink ONE (1) Pre-Surgery Clear Ensure or G2 at: 10:30 AM the morning of surgery. Drink in one sitting. Do not sip.  This drink was given to you during your hospital  pre-op appointment visit. Nothing else to drink after completing the  Pre-Surgery Clear Ensure or G2.          If you have questions, please contact your surgeon's office   Oral Hygiene is also important to reduce your risk of infection.                                     Remember - BRUSH YOUR TEETH THE MORNING OF SURGERY WITH YOUR REGULAR TOOTHPASTE   Do NOT smoke after Midnight   Take these medicines the morning of surgery with A SIP OF WATER: cetirizine,amlodipine.  Bring CPAP mask and tubing day of surgery.  You may not have any metal on your body including hair pins, jewelry, and body piercing             Do not wear lotions, powders, perfumes/cologne, or deodorant              Men may shave face and neck.   Do not bring valuables to the hospital. [place].   Contacts, dentures or bridgework may not be worn into surgery.   Bring small overnight bag day of surgery.   DO NOT [place]. PHARMACY WILL DISPENSE MEDICATIONS LISTED ON YOUR MEDICATION LIST TO YOU DURING YOUR ADMISSION [place]!    Patients discharged on the day of surgery will not be allowed to drive home.  Someone NEEDS to stay with you for the first 24 hours after anesthesia.   Special Instructions: Bring a copy of your healthcare power of attorney and living will documents         the day of surgery if you haven't scanned them before.              Please read over the following fact sheets you were given: IF YOU HAVE QUESTIONS ABOUT YOUR PRE-OP INSTRUCTIONS PLEASE CALL [phone]    [facility] - Preparing for Surgery Before surgery, you can play an important role.  Because skin is not sterile, your skin needs to be as free of germs as possible.  You can reduce the number of germs on your skin by washing with CHG (chlorahexidine gluconate) soap before surgery.  CHG is an antiseptic cleaner which kills germs and bonds with the skin to continue killing germs even after washing. Please DO NOT use if you have an allergy to CHG or antibacterial soaps.  If your skin becomes reddened/irritated stop using the CHG and inform your nurse when you arrive at Short  Stay. Do not shave (including legs and underarms) for at least 48 hours prior to the first CHG shower.  You may shave your face/neck. Please follow these instructions carefully:  1.  Shower with CHG Soap the night before surgery and the  morning of Surgery.  2.  If you choose to wash your hair, wash your hair first as usual with your  normal  shampoo.  3.  After you shampoo, rinse your hair and body thoroughly to remove the  shampoo.                           4.  Use CHG as you would any other liquid soap.  You can apply chg directly  to the skin and wash                       Gently with a scrungie or clean washcloth.  5.  Apply the CHG Soap to your body ONLY FROM THE NECK DOWN.   Do not use on face/ open                           Wound or open sores. Avoid contact with eyes, ears mouth and genitals (private parts).                       Wash face,  Genitals (private parts) with your  normal soap.             6.  Wash thoroughly, paying special attention to the area where your surgery  will be performed.  7.  Thoroughly rinse your body with warm water from the neck down.  8.  DO NOT shower/wash with your normal soap after using and rinsing off  the CHG Soap.                9.  Pat yourself dry with a clean towel.            10.  Wear clean pajamas.            11.  Place clean sheets on your bed the night of your first shower and do not  sleep with pets. Day of Surgery : Do not apply any lotions/deodorants the morning of surgery.  Please wear clean clothes to the hospital/surgery center.  FAILURE TO FOLLOW THESE INSTRUCTIONS MAY RESULT IN THE CANCELLATION OF YOUR SURGERY PATIENT SIGNATURE_________________________________  NURSE SIGNATURE__________________________________  ________________________________________________________________________  Adam Phenix  An incentive spirometer is a tool that can help keep your lungs clear and active. This tool measures how well you are filling  your lungs with each breath. Taking long deep breaths may help reverse or decrease the chance of developing breathing (pulmonary) problems (especially infection) following: A long period of time when you are unable to move or be active. BEFORE THE PROCEDURE  If the spirometer includes an indicator to show your best effort, your nurse or respiratory therapist will set it to a desired goal. If possible, sit up straight or lean slightly forward. Try not to slouch. Hold the incentive spirometer in an upright position. INSTRUCTIONS FOR USE  Sit on the edge of your bed if possible, or sit up as far as you can in bed or on a chair. Hold the incentive spirometer in an upright position. Breathe out normally. Place the mouthpiece in your mouth and seal your lips tightly around it. Breathe in slowly and as deeply as possible, raising the piston or the ball toward the top of the column. Hold your breath for 3-5 seconds or for as long as possible. Allow the piston or ball to fall to the bottom of the column. Remove the mouthpiece from your mouth and breathe out normally. Rest for a few seconds and repeat Steps 1 through 7 at least 10 times every 1-2 hours when you are awake. Take your time and take a few normal breaths between deep breaths. The spirometer may include an indicator to show your best effort. Use the indicator as a goal to work toward during each repetition. After each set of 10 deep breaths, practice coughing to be sure your lungs are clear. If you have an incision (the cut made at the time of surgery), support your incision when coughing by placing a pillow or rolled up towels firmly against it. Once you are able to get out of bed, walk around indoors and cough well. You may stop using the incentive spirometer when instructed by your caregiver.  RISKS AND COMPLICATIONS Take your time so you do not get dizzy or light-headed. If you are in pain, you may need to take or ask for pain medication  before doing incentive spirometry. It is harder to take a deep breath if you are having pain. AFTER USE Rest and breathe slowly and easily. It can be helpful to keep track of a log of your progress. Your caregiver can  provide you with a simple table to help with this. If you are using the spirometer at home, follow these instructions: [place] IF:  You are having difficultly using the spirometer. You have trouble using the spirometer as often as instructed. Your pain medication is not giving enough relief while using the spirometer. You develop fever of 100.5 F (38.1 C) or higher. SEEK IMMEDIATE MEDICAL CARE IF:  You cough up bloody sputum that had not been present before. You develop fever of 102 F (38.9 C) or greater. You develop worsening pain at or near the incision site. MAKE SURE YOU:  Understand these instructions. Will watch your condition. Will get help right away if you are not doing well or get worse. Document Released: 04/12/2007 Document Revised: 02/22/2012 Document Reviewed: 06/13/2007 [facility] Patient Information 2014 [place], [place].   ________________________________________________________________________

## 2022-11-12 ENCOUNTER — Encounter (HOSPITAL_COMMUNITY): Payer: Self-pay

## 2022-11-12 ENCOUNTER — Encounter (HOSPITAL_COMMUNITY)
Admission: RE | Admit: 2022-11-12 | Discharge: 2022-11-12 | Disposition: A | Discharge status: Home / Self Care | Payer: Medicare Other | Source: Ambulatory Visit | Attending: Orthopedic Surgery | Admitting: Orthopedic Surgery

## 2022-11-12 ENCOUNTER — Other Ambulatory Visit: Payer: Self-pay

## 2022-11-12 VITALS — BP 128/82 | HR 76 | Temp 97.9°F | Ht 71.0 in | Wt 270.0 lb

## 2022-11-12 DIAGNOSIS — I10 Essential (primary) hypertension: Secondary | ICD-10-CM | POA: Diagnosis not present

## 2022-11-12 DIAGNOSIS — Z01818 Encounter for other preprocedural examination: Secondary | ICD-10-CM

## 2022-11-12 LAB — BASIC METABOLIC PANEL
Anion gap: 5 (ref 5–15)
BUN: 25 mg/dL — ABNORMAL HIGH (ref 8–23)
CO2: 29 mmol/L (ref 22–32)
Calcium: 9.7 mg/dL (ref 8.9–10.3)
Chloride: 105 mmol/L (ref 98–111)
Creatinine, Ser: 1.1 mg/dL (ref 0.61–1.24)
GFR, Estimated: >60 mL/min (ref >60)
Glucose, Bld: 101 mg/dL — ABNORMAL HIGH (ref 70–99)
Potassium: 4.7 mmol/L (ref 3.5–5.1)
Sodium: 139 mmol/L (ref 135–145)

## 2022-11-12 LAB — CBC
HCT: 41.6 % (ref 39.0–52.0)
Hemoglobin: 13.6 g/dL (ref 13.0–17.0)
MCH: 30.4 pg (ref 26.0–34.0)
MCHC: 32.7 g/dL (ref 30.0–36.0)
MCV: 93.1 fL (ref 80.0–100.0)
Platelets: 255 10*3/uL (ref 150–400)
RBC: 4.47 MIL/uL (ref 4.22–5.81)
RDW: 14.6 % (ref 11.5–15.5)
WBC: 6.2 10*3/uL (ref 4.0–10.5)
nRBC: 0 % (ref 0.0–0.2)

## 2022-11-12 LAB — SURGICAL PCR SCREEN
MRSA, PCR: NEGATIVE
Staphylococcus aureus: NEGATIVE

## 2022-11-12 NOTE — Progress Notes (Addendum)
For Short Stay: [place] appointment date:  Bowel Prep reminder:   For Anesthesia: PCP - Dr. Jerilynn Birkenhead: Clearance: Chart: 10/26/22 Cardiologist - N/A  Chest x-ray -  EKG -  Stress Test -  ECHO - 03/04/16 Cardiac Cath -  Pacemaker/ICD device last checked: Pacemaker orders received: Device Rep notified:  Spinal Cord Stimulator:  Sleep Study - Yes CPAP - Yes  Fasting Blood Sugar -  Checks Blood Sugar _____ times a day Date and result of last Hgb A1c- 6.4: 10/26/22  Last dose of GLP1 agonist-  GLP1 instructions:   Last dose of SGLT-2 inhibitors-  SGLT-2 instructions:   Blood Thinner Instructions: Aspirin Instructions: Will be held a week before surgery: Dr. Gerarda Fraction. Last Dose:  Activity level: Can go up a flight of stairs and activities of daily living without stopping and without chest pain and/or shortness of breath   Able to exercise without chest pain and/or shortness of breath   Unable to go up a flight of stairs without chest pain and/or shortness of breath     Anesthesia review: Hx: HTN,OSA(CPAP),Stroke  Patient denies shortness of breath, fever, cough and chest pain at PAT appointment   Patient verbalized understanding of instructions that were given to them at the PAT appointment. Patient was also instructed that they will need to review over the PAT instructions again at home before surgery.

## 2022-11-13 ENCOUNTER — Other Ambulatory Visit: Payer: Self-pay | Admitting: *Deleted

## 2022-11-13 NOTE — Patient Outreach (Signed)
  Care Coordination   11/13/2022  Name: Timothy Mcdaniel MRN: [id number] DOB: 1946/03/24   Care Coordination Outreach Attempts:  An unsuccessful telephone outreach was attempted today to offer the patient information about available care coordination services as a benefit of their health plan. HIPAA compliant message left on voicemail, providing contact information for CSW, encouraging patient to return CSW's call at his earliest convenience.  Follow Up Plan:  Additional outreach attempts will be made to offer the patient care coordination information and services.   Encounter Outcome:  No Answer.   Care Coordination Interventions:  No, not indicated.    Nat Christen, BSW, MSW, LCSW  Licensed Education officer, environmental [facility] System  Mailing [place] [address] [place], [place] [address] Physical Address-[address], [place], [place] [address] Toll Free Main # [phone] Fax # [phone] Cell # [phone] [email or web address]

## 2022-11-17 NOTE — H&P (Signed)
TOTAL KNEE ADMISSION H&P  Patient is being admitted for right total knee arthroplasty.  Subjective:  Chief Complaint: Right knee pain.  HPI: Timothy Mcdaniel, 76 y.o. male has a history of pain and functional disability in the right knee due to arthritis and has failed non-surgical conservative treatments for greater than 12 weeks to include NSAID's and/or analgesics, corticosteriod injections, and activity modification. Onset of symptoms was gradual, starting several years ago with gradually worsening course since that time. The patient noted no past surgery on the right knee.  Patient currently rates pain in the right knee at 7 out of 10 with activity. Patient has night pain, worsening of pain with activity and weight bearing, and pain that interferes with activities of daily living. Patient has evidence of  bone-on-bone arthritis in the medial compartment with significant varus deformity and tibial subluxation. He also has significant patellofemoral narrowing and spurring. There are large posterior osteophytes  by imaging studies. There is no active infection.  Patient Active Problem List   Diagnosis Date Noted   Urinary frequency 01/12/2022   Malignant neoplasm of lateral wall of urinary bladder ([facility]) 01/12/2022   DOE (dyspnea on exertion) 02/11/2016   NICM (nonischemic cardiomyopathy) ([place]) 07/20/2013   HTN (hypertension) 07/20/2013   Morbid obesity ([place]) 07/20/2013   OSA on CPAP 07/20/2013   TIA (transient ischemic attack) 07/20/2013    Past Medical History:  Diagnosis Date   Arthritis    OA- both knees , L shoulder    Exogenous obesity    Hypercholesteremia    Hypertension    Nonischemic cardiomyopathy (H[facility])    OA (osteoarthritis)    OSA on CPAP    Recurrent upper respiratory infection (URI)    DEC. had flu shot- followed by bad cold, treated /w OTC   Shortness of breath    Stroke ([place])    ministroke    Past Surgical History:  Procedure Laterality Date   COLONOSCOPY  N/A 02/09/2014   Procedure: COLONOSCOPY;  Surgeon: Daneil Dolin, MD;  [facility];  Service: Endoscopy;  Laterality: N/A;  11:30 AM   EYE SURGERY     cataracts   MANDIBLE FRACTURE SURGERY     1960's - occupational injury   TOTAL KNEE ARTHROPLASTY  02/15/2012   Procedure: TOTAL KNEE ARTHROPLASTY;  Surgeon: Rudean Haskell, MD;  Location: [facility];  Service: Orthopedics;  Laterality: Left;   TRANSTHORACIC ECHOCARDIOGRAM  08/03/2011   EF 62%; LV systolic funtion mildly reduced; trace MR, mild TR   TRANSURETHRAL RESECTION OF BLADDER TUMOR N/A 08/12/2021   Procedure: TRANSURETHRAL RESECTION OF BLADDER TUMOR (TURBT) WITH POST OPERATIVE INSTILLATION OF GEMCITABINE;  Surgeon: [name], MD;  Location: [facility];  Service: Urology;  Laterality: N/A;   TRANSURETHRAL RESECTION OF BLADDER TUMOR N/A 11/14/2021   Procedure: TRANSURETHRAL RESECTION OF SMALL BLADDER TUMOR (TURBT) BILATERAL RETROGRADE/POST OPERATIVE INSTILLATION OF GEMCITABINE;  Surgeon: [name], MD;  Location: [facility];  Service: Urology;  Laterality: N/A;    Prior to Admission medications   Medication Sig Start Date End Date Taking? Authorizing Provider  acetaminophen (TYLENOL) 650 MG CR tablet Take 1,300 mg by mouth every 8 (eight) hours as needed for pain.   Yes [provider]  aspirin 81 MG tablet Take 81 mg by mouth daily.   Yes [provider]  Calcium-Magnesium-Zinc (CAL-MAG-ZINC PO) Take 1 tablet by mouth daily.   Yes [provider]  Cholecalciferol (VITAMIN D3) 125 MCG (5000 UT) CAPS Take 5,000 Units by  mouth 2 (two) times a week. Tues and Fri   Yes [provider]  Coenzyme Q10 (CO Q 10 PO) Take 200 mg by mouth daily.   Yes [provider]  dorzolamide-timolol (COSOPT) 22.3-6.8 MG/ML ophthalmic solution Place 1 drop into the right eye at bedtime.   Yes [provider]  fenofibrate (TRICOR) 48 MG tablet TAKE ONE TABLET BY MOUTH ONCE DAILY 10/31/14  Yes  Hilty, Nadean Corwin, MD  hydrochlorothiazide (HYDRODIURIL) 25 MG tablet Take 25 mg by mouth daily.   Yes [provider]  KRILL OIL OMEGA-3 PO Take 500 mg by mouth daily.   Yes [provider]  lisinopril (PRINIVIL,ZESTRIL) 40 MG tablet Take 40 mg by mouth daily.   Yes [provider]  Multiple Vitamins-Minerals (CENTRUM SILVER ADULT 50+ PO) Take 1 tablet by mouth daily.   Yes [provider]  traMADol (ULTRAM) 50 MG tablet Take 50 mg by mouth 2 (two) times daily.   Yes [provider]  traZODone (DESYREL) 100 MG tablet Take 100 mg by mouth at bedtime.   Yes [provider]  TURMERIC PO Take 1,500 mg by mouth daily.   Yes [provider]    No Known Allergies  Social History   Socioeconomic History   Marital status: Married    Spouse name: Not on file   Number of children: 2   Years of education: Not on file   Highest education level: Not on file  Occupational History   Not on file  Tobacco Use   Smoking status: Former    Packs/day: 0.50    Years: 30.00    Total pack years: 15.00    Types: Cigarettes    Quit date: 03/29/1994    Years since quitting: 28.6   Smokeless tobacco: Never  Vaping Use   Vaping Use: Never used  Substance and Sexual Activity   Alcohol use: Yes    Comment: rare    one time a month   Drug use: No   Sexual activity: Not Currently  Other Topics Concern   Not on file  Social History Narrative   Not on file   Social Determinants of Health   Financial Resource Strain: Not on file  Food Insecurity: Not on file  Transportation Needs: Not on file  Physical Activity: Not on file  Stress: Not on file  Social Connections: Not on file  Intimate Partner Violence: Not on file    Tobacco Use: Medium Risk (11/12/2022)   Patient History    Smoking Tobacco Use: Former    Smokeless Tobacco Use: Never    Passive Exposure: Not on file   Social History   Substance and Sexual Activity  Alcohol Use  Yes   Comment: rare    one time a month    Family History  Problem Relation Age of Onset   Heart attack Mother    Cancer Father    Kidney disease Brother    Anesthesia problems Neg Hx    Hypotension Neg Hx    Malignant hyperthermia Neg Hx    Pseudochol deficiency Neg Hx    Colon cancer Neg Hx     Review of Systems  Constitutional:  Negative for chills and fever.  HENT: Negative.    Eyes: Negative.   Respiratory:  Negative for cough and shortness of breath.   Cardiovascular:  Negative for chest pain and palpitations.  Gastrointestinal:  Negative for abdominal pain, constipation, diarrhea, nausea and vomiting.  Genitourinary:  Negative  for dysuria, frequency and urgency.  Musculoskeletal:  Positive for joint pain.  Skin:  Negative for rash.   Objective:  Physical Exam: Well nourished and well developed.  General: Alert and oriented x3, cooperative and pleasant, no acute distress.  Head: normocephalic, atraumatic, neck supple.  Eyes: EOMI. Abdomen: non-tender to palpation and soft, normoactive bowel sounds. Musculoskeletal: The patient has an antalgic gait pattern favoring the right side.   Right Hip Exam:  The range of motion: normal without discomfort.   Right Knee Exam:  No effusion present. No swelling present.  Slight varus deformity.  The range of motion is: 5 to 130 degrees.  Moderate crepitus on range of motion of the knee.  Positive medial joint line tenderness.  No lateral joint line tenderness.  The knee is stable.  Calves soft and nontender. Motor function intact in LE. Strength 5/5 LE bilaterally. Neuro: Distal pulses 2+. Sensation to light touch intact in LE.  Vital signs in last 24 hours:    Imaging Review Plain radiographs demonstrate severe degenerative joint disease of the right knee. The overall alignment is neutral. The bone quality appears to be adequate for age and reported activity level.  Assessment/Plan:  End stage arthritis, right  knee   The patient history, physical examination, clinical judgment of the provider and imaging studies are consistent with end stage degenerative joint disease of the right knee and total knee arthroplasty is deemed medically necessary. The treatment options including medical management, injection therapy arthroscopy and arthroplasty were discussed at length. The risks and benefits of total knee arthroplasty were presented and reviewed. The risks due to aseptic loosening, infection, stiffness, patella tracking problems, thromboembolic complications and other imponderables were discussed. The patient acknowledged the explanation, agreed to proceed with the plan and consent was signed. Patient is being admitted for inpatient treatment for surgery, pain control, PT, OT, prophylactic antibiotics, VTE prophylaxis, progressive ambulation and ADLs and discharge planning. The patient is planning to be discharged  home .  Patient's anticipated LOS is less than 2 midnights, meeting these requirements: - Lives within 1 hour of care - Has a competent adult at home to recover with post-op recover - NO history of  - Chronic pain requiring opioids  - Coronary Artery Disease  - Heart failure  - Heart attack  - DVT/VTE  - Cardiac arrhythmia  - Respiratory Failure/COPD  - Renal failure  - Anemia  - Advanced Liver disease  Therapy Plans: EO [place] Disposition: Home with Wife Planned DVT Prophylaxis: Xarelto 10 mg (hx bladder cancer and TIA) DME Needed: None PCP: Redmond School, MD (clearance received) TXA: IV Allergies: NKDA Anesthesia Concerns: None BMI: 39.9 Last HgbA1c: 6.4% - Nov. 2023  Pharmacy: [facility] [place], [place])  Other: -Per PCP, hold aspirin 5-7 days prior -Takes Tramadol BID  - Patient was instructed on what medications to stop prior to surgery. - Follow-up visit in 2 weeks with Dr. Wynelle Link - Begin physical therapy following surgery - Pre-operative lab work as  pre-surgical testing - Prescriptions will be provided in hospital at time of discharge  R. Jaynie Bream, PA-C Orthopedic Surgery [facility]

## 2022-11-18 ENCOUNTER — Other Ambulatory Visit: Payer: Self-pay | Admitting: *Deleted

## 2022-11-18 NOTE — Patient Outreach (Signed)
  Care Coordination   11/18/2022  Name: Timothy Mcdaniel MRN: [id number] DOB: [birth year]   Care Coordination Outreach Attempts:  A second unsuccessful outreach was attempted today to offer the patient with information about available care coordination services as a benefit of their health plan.   HIPAA compliant message left on voicemail, providing contact information for CSW, encouraging patient to return CSW's call at his earliest convenience.  Follow Up Plan:  Additional outreach attempts will be made to offer the patient care coordination information and services.   Encounter Outcome:  No Answer.   Care Coordination Interventions:  No, not indicated.    Nat Christen, BSW, MSW, LCSW  Licensed Education officer, environmental Health System  Mailing [place] [address] [place], [place] [address] Physical Address-[address], [place], [place] [address] Toll Free Main # [phone] Fax # [phone] Cell # [phone] [email or web address]

## 2022-11-20 NOTE — Anesthesia Preprocedure Evaluation (Addendum)
Anesthesia Evaluation  Patient identified by MRN, date of birth, ID band Patient awake    Reviewed: Allergy & Precautions, NPO status , Patient's Chart, lab work & pertinent test results  History of Anesthesia Complications Negative for: history of anesthetic complications  Airway Mallampati: II  TM Distance: >3 FB Neck ROM: Full    Dental no notable dental hx.    Pulmonary sleep apnea and Continuous Positive Airway Pressure Ventilation , former smoker   Pulmonary exam normal        Cardiovascular hypertension, Pt. on medications Normal cardiovascular exam  TTE 2017: EF 55-60%, moderate LVH, grade 1 DD, mild LAE, mild pHTN (PASP )      Neuro/Psych TIACVA  negative psych ROS   GI/Hepatic negative GI ROS, Neg liver ROS,,,  Endo/Other  negative endocrine ROS    Renal/GU negative Renal ROS  negative genitourinary   Musculoskeletal  (+) Arthritis , Osteoarthritis,    Abdominal   Peds  Hematology negative hematology ROS (+)   Anesthesia Other Findings Day of surgery medications reviewed with patient.  Reproductive/Obstetrics negative OB ROS                              Anesthesia Physical Anesthesia Plan  ASA: 3  Anesthesia Plan: Spinal   Post-op Pain Management: Regional block* and Ofirmev IV (intra-op)*   Induction:   PONV Risk Score and Plan: 2 and Treatment may vary due to age or medical condition, Ondansetron, Propofol infusion and Dexamethasone  Airway Management Planned: Natural Airway and Simple Face Mask  Additional Equipment: None  Intra-op Plan:   Post-operative Plan:   Informed Consent: I have reviewed the patients History and Physical, chart, labs and discussed the procedure including the risks, benefits and alternatives for the proposed anesthesia with the patient or authorized representative who has indicated his/her understanding and acceptance.        Plan Discussed with:   Anesthesia Plan Comments:         Anesthesia Quick Evaluation

## 2022-11-23 ENCOUNTER — Ambulatory Visit (HOSPITAL_BASED_OUTPATIENT_CLINIC_OR_DEPARTMENT_OTHER): Payer: Medicare Other | Admitting: Anesthesiology

## 2022-11-23 ENCOUNTER — Encounter (HOSPITAL_COMMUNITY): Payer: Self-pay | Admitting: Orthopedic Surgery

## 2022-11-23 ENCOUNTER — Ambulatory Visit (HOSPITAL_COMMUNITY): Payer: Medicare Other | Admitting: Physician Assistant

## 2022-11-23 ENCOUNTER — Other Ambulatory Visit: Payer: Self-pay

## 2022-11-23 ENCOUNTER — Observation Stay (HOSPITAL_COMMUNITY)
Admission: RE | Admit: 2022-11-23 | Discharge: 2022-11-24 | Disposition: A | Discharge status: Home / Self Care | Payer: Medicare Other | Source: Ambulatory Visit | Attending: Orthopedic Surgery | Admitting: Orthopedic Surgery

## 2022-11-23 ENCOUNTER — Encounter (HOSPITAL_COMMUNITY)
Admission: RE | Disposition: A | Discharge status: Home / Self Care | Payer: Self-pay | Source: Ambulatory Visit | Attending: Orthopedic Surgery

## 2022-11-23 DIAGNOSIS — G4733 Obstructive sleep apnea (adult) (pediatric): Secondary | ICD-10-CM | POA: Diagnosis not present

## 2022-11-23 DIAGNOSIS — Z79899 Other long term (current) drug therapy: Secondary | ICD-10-CM | POA: Diagnosis not present

## 2022-11-23 DIAGNOSIS — I10 Essential (primary) hypertension: Secondary | ICD-10-CM | POA: Insufficient documentation

## 2022-11-23 DIAGNOSIS — Z9989 Dependence on other enabling machines and devices: Secondary | ICD-10-CM | POA: Diagnosis not present

## 2022-11-23 DIAGNOSIS — Z8551 Personal history of malignant neoplasm of bladder: Secondary | ICD-10-CM | POA: Insufficient documentation

## 2022-11-23 DIAGNOSIS — M1711 Unilateral primary osteoarthritis, right knee: Secondary | ICD-10-CM

## 2022-11-23 DIAGNOSIS — Z7982 Long term (current) use of aspirin: Secondary | ICD-10-CM | POA: Diagnosis not present

## 2022-11-23 DIAGNOSIS — Z87891 Personal history of nicotine dependence: Secondary | ICD-10-CM

## 2022-11-23 DIAGNOSIS — G8918 Other acute postprocedural pain: Secondary | ICD-10-CM | POA: Diagnosis not present

## 2022-11-23 DIAGNOSIS — Z8673 Personal history of transient ischemic attack (TIA), and cerebral infarction without residual deficits: Secondary | ICD-10-CM | POA: Diagnosis not present

## 2022-11-23 DIAGNOSIS — Z96652 Presence of left artificial knee joint: Secondary | ICD-10-CM | POA: Diagnosis not present

## 2022-11-23 DIAGNOSIS — M179 Osteoarthritis of knee, unspecified: Secondary | ICD-10-CM | POA: Diagnosis present

## 2022-11-23 HISTORY — PX: TOTAL KNEE ARTHROPLASTY: SHX125

## 2022-11-23 SURGERY — ARTHROPLASTY, KNEE, TOTAL
Anesthesia: Spinal | Site: Knee | Laterality: Right

## 2022-11-23 MED ORDER — FLEET ENEMA 7-19 GM/118ML RE ENEM: 1.0000 | ENEMA | Freq: Once | RECTAL | Status: DC | PRN

## 2022-11-23 MED ORDER — METHOCARBAMOL 1000 MG/10ML IJ SOLN: 500.0000 mg | Freq: Four times a day (QID) | INTRAVENOUS | Status: DC | PRN

## 2022-11-23 MED ORDER — BUPIVACAINE LIPOSOME 1.3 % IJ SUSP
INTRAMUSCULAR | Status: DC | PRN
  Administered 2022-11-23: 20 mL

## 2022-11-23 MED ORDER — MORPHINE SULFATE (PF) 2 MG/ML IV SOLN: 1.0000 mg | INTRAVENOUS | Status: DC | PRN

## 2022-11-23 MED ORDER — POVIDONE-IODINE 10 % EX SWAB: 2.0000 | Freq: Once | CUTANEOUS | Status: DC

## 2022-11-23 MED ORDER — TRAMADOL HCL 50 MG PO TABS
50.0000 mg | ORAL_TABLET | Freq: Four times a day (QID) | ORAL | Status: DC | PRN
  Administered 2022-11-24 (×3): 100 mg via ORAL
  Filled 2022-11-23 (×3): qty 2

## 2022-11-23 MED ORDER — DEXAMETHASONE SODIUM PHOSPHATE 10 MG/ML IJ SOLN
10.0000 mg | Freq: Once | INTRAMUSCULAR | Status: AC
  Administered 2022-11-24: 10 mg via INTRAVENOUS
  Filled 2022-11-23: qty 1

## 2022-11-23 MED ORDER — SODIUM CHLORIDE 0.9 % IV SOLN: INTRAVENOUS | Status: DC

## 2022-11-23 MED ORDER — BISACODYL 10 MG RE SUPP: 10.0000 mg | Freq: Every day | RECTAL | Status: DC | PRN

## 2022-11-23 MED ORDER — LACTATED RINGERS IV SOLN: INTRAVENOUS | Status: DC

## 2022-11-23 MED ORDER — CEFAZOLIN SODIUM-DEXTROSE 2-4 GM/100ML-% IV SOLN
2.0000 g | Freq: Four times a day (QID) | INTRAVENOUS | Status: AC
  Administered 2022-11-23 – 2022-11-24 (×2): 2 g via INTRAVENOUS
  Filled 2022-11-23 (×2): qty 100

## 2022-11-23 MED ORDER — MIDAZOLAM HCL 2 MG/2ML IJ SOLN
0.5000 mg | Freq: Once | INTRAMUSCULAR | Status: DC
  Filled 2022-11-23: qty 2

## 2022-11-23 MED ORDER — CEFAZOLIN SODIUM-DEXTROSE 2-4 GM/100ML-% IV SOLN: 2.0000 g | INTRAVENOUS | Status: DC

## 2022-11-23 MED ORDER — PROPOFOL 500 MG/50ML IV EMUL
INTRAVENOUS | Status: DC | PRN
  Administered 2022-11-23: 100 ug/kg/min via INTRAVENOUS

## 2022-11-23 MED ORDER — TRAZODONE HCL 100 MG PO TABS: 100.0000 mg | ORAL_TABLET | Freq: Every day | ORAL | Status: DC

## 2022-11-23 MED ORDER — DEXAMETHASONE SODIUM PHOSPHATE 10 MG/ML IJ SOLN
INTRAMUSCULAR | Status: AC
  Filled 2022-11-23: qty 1

## 2022-11-23 MED ORDER — FENOFIBRATE 54 MG PO TABS
54.0000 mg | ORAL_TABLET | Freq: Every day | ORAL | Status: DC
  Administered 2022-11-24: 54 mg via ORAL
  Filled 2022-11-23: qty 1

## 2022-11-23 MED ORDER — SODIUM CHLORIDE (PF) 0.9 % IJ SOLN
INTRAMUSCULAR | Status: AC
  Filled 2022-11-23: qty 10

## 2022-11-23 MED ORDER — CLONIDINE HCL (ANALGESIA) 100 MCG/ML EP SOLN
EPIDURAL | Status: DC | PRN
  Administered 2022-11-23: 100 ug

## 2022-11-23 MED ORDER — METOCLOPRAMIDE HCL 5 MG PO TABS: 5.0000 mg | ORAL_TABLET | Freq: Three times a day (TID) | ORAL | Status: DC | PRN

## 2022-11-23 MED ORDER — FENTANYL CITRATE PF 50 MCG/ML IJ SOSY
25.0000 ug | PREFILLED_SYRINGE | Freq: Once | INTRAMUSCULAR | Status: AC
  Administered 2022-11-23: 50 ug via INTRAVENOUS
  Filled 2022-11-23: qty 2

## 2022-11-23 MED ORDER — ONDANSETRON HCL 4 MG/2ML IJ SOLN
INTRAMUSCULAR | Status: AC
  Filled 2022-11-23: qty 2

## 2022-11-23 MED ORDER — ACETAMINOPHEN 10 MG/ML IV SOLN
1000.0000 mg | Freq: Four times a day (QID) | INTRAVENOUS | Status: DC
  Administered 2022-11-23: 1000 mg via INTRAVENOUS
  Filled 2022-11-23: qty 100

## 2022-11-23 MED ORDER — METOCLOPRAMIDE HCL 5 MG/ML IJ SOLN: 5.0000 mg | Freq: Three times a day (TID) | INTRAMUSCULAR | Status: DC | PRN

## 2022-11-23 MED ORDER — PHENOL 1.4 % MT LIQD: 1.0000 | OROMUCOSAL | Status: DC | PRN

## 2022-11-23 MED ORDER — ONDANSETRON HCL 4 MG PO TABS: 4.0000 mg | ORAL_TABLET | Freq: Four times a day (QID) | ORAL | Status: DC | PRN

## 2022-11-23 MED ORDER — CEFAZOLIN IN SODIUM CHLORIDE 3-0.9 GM/100ML-% IV SOLN
3.0000 g | INTRAVENOUS | Status: AC
  Administered 2022-11-23: 3 g via INTRAVENOUS
  Filled 2022-11-23: qty 100

## 2022-11-23 MED ORDER — METHOCARBAMOL 500 MG PO TABS
500.0000 mg | ORAL_TABLET | Freq: Four times a day (QID) | ORAL | Status: DC | PRN
  Administered 2022-11-23 – 2022-11-24 (×2): 500 mg via ORAL
  Filled 2022-11-23 (×2): qty 1

## 2022-11-23 MED ORDER — 0.9 % SODIUM CHLORIDE (POUR BTL) OPTIME
TOPICAL | Status: DC | PRN
  Administered 2022-11-23: 1000 mL

## 2022-11-23 MED ORDER — POLYETHYLENE GLYCOL 3350 17 G PO PACK: 17.0000 g | PACK | Freq: Every day | ORAL | Status: DC | PRN

## 2022-11-23 MED ORDER — HYDROCHLOROTHIAZIDE 25 MG PO TABS
25.0000 mg | ORAL_TABLET | Freq: Every day | ORAL | Status: DC
  Administered 2022-11-24: 25 mg via ORAL
  Filled 2022-11-23: qty 1

## 2022-11-23 MED ORDER — BUPIVACAINE LIPOSOME 1.3 % IJ SUSP
INTRAMUSCULAR | Status: AC
  Filled 2022-11-23: qty 20

## 2022-11-23 MED ORDER — DEXAMETHASONE SODIUM PHOSPHATE 10 MG/ML IJ SOLN
8.0000 mg | Freq: Once | INTRAMUSCULAR | Status: AC
  Administered 2022-11-23: 10 mg via INTRAVENOUS

## 2022-11-23 MED ORDER — DOCUSATE SODIUM 100 MG PO CAPS
100.0000 mg | ORAL_CAPSULE | Freq: Two times a day (BID) | ORAL | Status: DC
  Administered 2022-11-23 – 2022-11-24 (×2): 100 mg via ORAL
  Filled 2022-11-23 (×2): qty 1

## 2022-11-23 MED ORDER — ONDANSETRON HCL 4 MG/2ML IJ SOLN: 4.0000 mg | Freq: Four times a day (QID) | INTRAMUSCULAR | Status: DC | PRN

## 2022-11-23 MED ORDER — TRANEXAMIC ACID-NACL 1000-0.7 MG/100ML-% IV SOLN
1000.0000 mg | INTRAVENOUS | Status: AC
  Administered 2022-11-23: 1000 mg via INTRAVENOUS
  Filled 2022-11-23: qty 100

## 2022-11-23 MED ORDER — LIDOCAINE HCL (PF) 2 % IJ SOLN
INTRAMUSCULAR | Status: AC
  Filled 2022-11-23: qty 5

## 2022-11-23 MED ORDER — OXYCODONE HCL 5 MG PO TABS
5.0000 mg | ORAL_TABLET | ORAL | Status: DC | PRN
  Administered 2022-11-24: 5 mg via ORAL
  Filled 2022-11-23: qty 1

## 2022-11-23 MED ORDER — ACETAMINOPHEN 500 MG PO TABS
1000.0000 mg | ORAL_TABLET | Freq: Four times a day (QID) | ORAL | Status: AC
  Administered 2022-11-23 – 2022-11-24 (×4): 1000 mg via ORAL
  Filled 2022-11-23 (×4): qty 2

## 2022-11-23 MED ORDER — SODIUM CHLORIDE 0.9 % IR SOLN
Status: DC | PRN
  Administered 2022-11-23: 1000 mL

## 2022-11-23 MED ORDER — ORAL CARE MOUTH RINSE: 15.0000 mL | Freq: Once | OROMUCOSAL | Status: AC

## 2022-11-23 MED ORDER — PROPOFOL 10 MG/ML IV BOLUS
INTRAVENOUS | Status: DC | PRN
  Administered 2022-11-23: 10 mg via INTRAVENOUS
  Administered 2022-11-23: 20 mg via INTRAVENOUS
  Administered 2022-11-23: 10 mg via INTRAVENOUS

## 2022-11-23 MED ORDER — LIDOCAINE 2% (20 MG/ML) 5 ML SYRINGE
INTRAMUSCULAR | Status: DC | PRN
  Administered 2022-11-23 (×2): 20 mg via INTRAVENOUS

## 2022-11-23 MED ORDER — OXYCODONE HCL 5 MG PO TABS: 5.0000 mg | ORAL_TABLET | Freq: Once | ORAL | Status: DC | PRN

## 2022-11-23 MED ORDER — CHLORHEXIDINE GLUCONATE 0.12 % MT SOLN
15.0000 mL | Freq: Once | OROMUCOSAL | Status: AC
  Administered 2022-11-23: 15 mL via OROMUCOSAL

## 2022-11-23 MED ORDER — DORZOLAMIDE HCL-TIMOLOL MAL 2-0.5 % OP SOLN
1.0000 [drp] | Freq: Every day | OPHTHALMIC | Status: DC
  Administered 2022-11-23: 1 [drp] via OPHTHALMIC
  Filled 2022-11-23: qty 10

## 2022-11-23 MED ORDER — FENTANYL CITRATE PF 50 MCG/ML IJ SOSY: 25.0000 ug | PREFILLED_SYRINGE | INTRAMUSCULAR | Status: DC | PRN

## 2022-11-23 MED ORDER — RIVAROXABAN 10 MG PO TABS
10.0000 mg | ORAL_TABLET | Freq: Every day | ORAL | Status: DC
  Administered 2022-11-24: 10 mg via ORAL
  Filled 2022-11-23: qty 1

## 2022-11-23 MED ORDER — SODIUM CHLORIDE (PF) 0.9 % IJ SOLN
INTRAMUSCULAR | Status: AC
  Filled 2022-11-23: qty 50

## 2022-11-23 MED ORDER — PROPOFOL 1000 MG/100ML IV EMUL
INTRAVENOUS | Status: AC
  Filled 2022-11-23: qty 100

## 2022-11-23 MED ORDER — BUPIVACAINE LIPOSOME 1.3 % IJ SUSP: 20.0000 mL | Freq: Once | INTRAMUSCULAR | Status: DC

## 2022-11-23 MED ORDER — BUPIVACAINE-EPINEPHRINE (PF) 0.5% -1:200000 IJ SOLN
INTRAMUSCULAR | Status: DC | PRN
  Administered 2022-11-23: 15 mL via PERINEURAL

## 2022-11-23 MED ORDER — DIPHENHYDRAMINE HCL 12.5 MG/5ML PO ELIX: 12.5000 mg | ORAL_SOLUTION | ORAL | Status: DC | PRN

## 2022-11-23 MED ORDER — OXYCODONE HCL 5 MG/5ML PO SOLN: 5.0000 mg | Freq: Once | ORAL | Status: DC | PRN

## 2022-11-23 MED ORDER — SODIUM CHLORIDE (PF) 0.9 % IJ SOLN
INTRAMUSCULAR | Status: DC | PRN
  Administered 2022-11-23: 60 mL

## 2022-11-23 MED ORDER — MENTHOL 3 MG MT LOZG: 1.0000 | LOZENGE | OROMUCOSAL | Status: DC | PRN

## 2022-11-23 MED ORDER — BUPIVACAINE IN DEXTROSE 0.75-8.25 % IT SOLN
INTRATHECAL | Status: DC | PRN
  Administered 2022-11-23: 1.8 mL via INTRATHECAL

## 2022-11-23 SURGICAL SUPPLY — 52 items
ATTUNE MED DOME PAT 41 KNEE (Knees) IMPLANT
ATTUNE PS FEM RT SZ 6 CEM KNEE (Femur) IMPLANT
ATTUNE PSRP INSR SZ6 8 KNEE (Insert) IMPLANT
BAG COUNTER SPONGE SURGICOUNT (BAG) IMPLANT
BAG SPEC THK2 15X12 ZIP CLS (MISCELLANEOUS) ×1
BAG SPNG CNTER NS LX DISP (BAG) ×1
BAG ZIPLOCK 12X15 (MISCELLANEOUS) ×1 IMPLANT
BASE TIBIAL ROT PLAT SZ 7 KNEE (Knees) IMPLANT
BLADE SAG 18X100X1.27 (BLADE) ×1 IMPLANT
BLADE SAW SGTL 11.0X1.19X90.0M (BLADE) ×1 IMPLANT
BNDG ELASTIC 6X5.8 VLCR STR LF (GAUZE/BANDAGES/DRESSINGS) ×1 IMPLANT
BOWL SMART MIX CTS (DISPOSABLE) ×1 IMPLANT
BSPLAT TIB 7 CMNT ROT PLAT STR (Knees) ×1 IMPLANT
CEMENT HV SMART SET (Cement) ×2 IMPLANT
COVER SURGICAL LIGHT HANDLE (MISCELLANEOUS) ×1 IMPLANT
CUFF TOURN SGL QUICK 34 (TOURNIQUET CUFF) ×1
CUFF TRNQT CYL 34X4.125X (TOURNIQUET CUFF) ×1 IMPLANT
DRAPE INCISE IOBAN 66X45 STRL (DRAPES) ×1 IMPLANT
DRAPE U-SHAPE 47X51 STRL (DRAPES) ×1 IMPLANT
DRSG AQUACEL AG ADV 3.5X10 (GAUZE/BANDAGES/DRESSINGS) ×1 IMPLANT
DURAPREP 26ML APPLICATOR (WOUND CARE) ×1 IMPLANT
ELECT REM PT RETURN 15FT ADLT (MISCELLANEOUS) ×1 IMPLANT
GLOVE BIO SURGEON STRL SZ 6.5 (GLOVE) IMPLANT
GLOVE BIO SURGEON STRL SZ8 (GLOVE) ×1 IMPLANT
GLOVE BIOGEL PI IND STRL 6.5 (GLOVE) IMPLANT
GLOVE BIOGEL PI IND STRL 8 (GLOVE) ×1 IMPLANT
GOWN STRL REUS W/ TWL LRG LVL3 (GOWN DISPOSABLE) ×1 IMPLANT
GOWN STRL REUS W/TWL LRG LVL3 (GOWN DISPOSABLE) ×3
HANDPIECE INTERPULSE COAX TIP (DISPOSABLE) ×1
HOLDER FOLEY CATH W/STRAP (MISCELLANEOUS) IMPLANT
IMMOBILIZER KNEE 20 (SOFTGOODS) ×1
IMMOBILIZER KNEE 20 THIGH 36 (SOFTGOODS) ×1 IMPLANT
KIT TURNOVER KIT A (KITS) IMPLANT
MANIFOLD NEPTUNE II (INSTRUMENTS) ×1 IMPLANT
NS IRRIG 1000ML POUR BTL (IV SOLUTION) ×1 IMPLANT
PACK TOTAL KNEE CUSTOM (KITS) ×1 IMPLANT
PADDING CAST COTTON 6X4 STRL (CAST SUPPLIES) ×2 IMPLANT
PIN STEINMAN FIXATION KNEE (PIN) IMPLANT
PROTECTOR NERVE ULNAR (MISCELLANEOUS) ×1 IMPLANT
SET HNDPC FAN SPRY TIP SCT (DISPOSABLE) ×1 IMPLANT
SPIKE FLUID TRANSFER (MISCELLANEOUS) ×1 IMPLANT
STRIP CLOSURE SKIN 1/2X4 (GAUZE/BANDAGES/DRESSINGS) ×2 IMPLANT
SUT MNCRL AB 4-0 PS2 18 (SUTURE) ×1 IMPLANT
SUT STRATAFIX 0 PDS 27 VIOLET (SUTURE) ×1
SUT VIC AB 2-0 CT1 27 (SUTURE) ×3
SUT VIC AB 2-0 CT1 TAPERPNT 27 (SUTURE) ×3 IMPLANT
SUTURE STRATFX 0 PDS 27 VIOLET (SUTURE) ×1 IMPLANT
TIBIAL BASE ROT PLAT SZ 7 KNEE (Knees) ×1 IMPLANT
TRAY FOLEY MTR SLVR 16FR STAT (SET/KITS/TRAYS/PACK) ×1 IMPLANT
TUBE SUCTION HIGH CAP CLEAR NV (SUCTIONS) ×1 IMPLANT
WATER STERILE IRR 1000ML POUR (IV SOLUTION) ×2 IMPLANT
WRAP KNEE MAXI GEL POST OP (GAUZE/BANDAGES/DRESSINGS) ×1 IMPLANT

## 2022-11-23 NOTE — Interval H&P Note (Signed)
History and Physical Interval Note:  11/23/2022 10:39 AM  Timothy Mcdaniel  has presented today for surgery, with the diagnosis of right knee osteoarthritis.  The various methods of treatment have been discussed with the patient and family. After consideration of risks, benefits and other options for treatment, the patient has consented to  Procedure(s): TOTAL KNEE ARTHROPLASTY (Right) as a surgical intervention.  The patient's history has been reviewed, patient examined, no change in status, stable for surgery.  I have reviewed the patient's chart and labs.  Questions were answered to the patient's satisfaction.     Pilar Plate Helene Bernstein

## 2022-11-23 NOTE — Progress Notes (Signed)
Orthopedic Tech Progress Note Patient Details:  TYLAN KINN [birth year] [id number] Right 10-40 CPM was applied in PACU. CPM will need to be removed at 7:40 CPM Right Knee CPM Right Knee: On Right Knee Flexion (Degrees): 40 Right Knee Extension (Degrees): 10  Post Interventions Patient Tolerated: Well  Linus Salmons Montrice Montuori 11/23/2022, 3:44 PM

## 2022-11-23 NOTE — Anesthesia Procedure Notes (Signed)
Anesthesia Regional Block: Adductor canal block   Pre-Anesthetic Checklist: , timeout performed,  Correct Patient, Correct Site, Correct Laterality,  Correct Procedure, Correct Position, site marked,  Risks and benefits discussed,  Pre-op evaluation,  At surgeon's request and post-op pain management  Laterality: Right  Prep: Maximum Sterile Barrier Precautions used, chloraprep       Needles:  Injection technique: Single-shot  Needle Type: Echogenic Stimulator Needle     Needle Length: 9cm  Needle Gauge: 22     Additional Needles:   Procedures:,,,, ultrasound used (permanent image in chart),,    Narrative:  Start time: 11/23/2022 12:45 PM End time: 11/23/2022 12:48 PM Injection made incrementally with aspirations every 5 mL.  Performed by: Personally  Anesthesiologist: Brennan Bailey, MD  Additional Notes: Risks, benefits, and alternative discussed. Patient gave consent for procedure. Patient prepped and draped in sterile fashion. Sedation administered, patient remains easily responsive to voice. Relevant anatomy identified with ultrasound guidance. Local anesthetic given in 5cc increments with no signs or symptoms of intravascular injection. No pain or paraesthesias with injection. Patient monitored throughout procedure with signs of LAST or immediate complications. Tolerated well. Ultrasound image placed in chart.  Tawny Asal, MD

## 2022-11-23 NOTE — Plan of Care (Signed)

## 2022-11-23 NOTE — Discharge Instructions (Addendum)
Gaynelle Arabian, MD Total Joint Specialist [facility] [place] [address], [address] [place], [place] [address] [phone]  TOTAL KNEE REPLACEMENT POSTOPERATIVE DIRECTIONS    Knee Rehabilitation, Guidelines Following Surgery  Results after knee surgery are often greatly improved when you follow the exercise, range of motion and muscle strengthening exercises prescribed by your doctor. Safety measures are also important to protect the knee from further injury. If any of these exercises cause you to have increased pain or swelling in your knee joint, decrease the amount until you are comfortable again and slowly increase them. If you have problems or questions, call your caregiver or physical therapist for advice.   BLOOD CLOT PREVENTION Take a 10 mg Xarelto once a day for three weeks following surgery. Then resume an 81 mg Aspirin once a day You may resume your vitamins/supplements once you have discontinued the Xarelto. Do not take any NSAIDs (Advil, Aleve, Ibuprofen, Meloxicam, etc.) until you have discontinued the Xarelto.   HOME CARE INSTRUCTIONS  Remove items at home which could result in a fall. This includes throw rugs or furniture in walking pathways.  ICE to the affected knee as much as tolerated. Icing helps control swelling. If the swelling is well controlled you will be more comfortable and rehab easier. Continue to use ice on the knee for pain and swelling from surgery. You may notice swelling that will progress down to the foot and ankle. This is normal after surgery. Elevate the leg when you are not up walking on it.    Continue to use the breathing machine which will help keep your temperature down. It is common for your temperature to cycle up and down following surgery, especially at night when you are not up moving around and exerting yourself. The breathing machine keeps your lungs expanded and your temperature down. Do not place pillow under the operative  knee, focus on keeping the knee straight while resting  DIET You may resume your previous home diet once you are discharged from the hospital.  DRESSING / WOUND CARE / SHOWERING Keep your bulky bandage on for 2 days. On the third post-operative day you may remove the Ace bandage and gauze. There is a waterproof adhesive bandage on your skin which will stay in place until your first follow-up appointment. Once you remove this you will not need to place another bandage You may begin showering 3 days following surgery, but do not submerge the incision under water.  ACTIVITY For the first 5 days, the key is rest and control of pain and swelling Do your home exercises twice a day starting on post-operative day 3. On the days you go to physical therapy, just do the home exercises once that day. You should rest, ice and elevate the leg for 50 minutes out of every hour. Get up and walk/stretch for 10 minutes per hour. After 5 days you can increase your activity slowly as tolerated. Walk with your walker as instructed. Use the walker until you are comfortable transitioning to a cane. Walk with the cane in the opposite hand of the operative leg. You may discontinue the cane once you are comfortable and walking steadily. Avoid periods of inactivity such as sitting longer than an hour when not asleep. This helps prevent blood clots.  You may discontinue the knee immobilizer once you are able to perform a straight leg raise while lying down. You may resume a sexual relationship in one month or when given the OK by your  doctor.  You may return to work once you are cleared by your doctor.  Do not drive a car for 6 weeks or until released by your surgeon.  Do not drive while taking narcotics.  TED HOSE STOCKINGS Wear the elastic stockings on both legs for three weeks following surgery during the day. You may remove them at night for sleeping.  WEIGHT BEARING Weight bearing as tolerated with assist device  (walker, cane, etc) as directed, use it as long as suggested by your surgeon or therapist, typically at least 4-6 weeks.  POSTOPERATIVE CONSTIPATION PROTOCOL Constipation - defined medically as fewer than three stools per week and severe constipation as less than one stool per week.  One of the most common issues patients have following surgery is constipation.  Even if you have a regular bowel pattern at home, your normal regimen is likely to be disrupted due to multiple reasons following surgery.  Combination of anesthesia, postoperative narcotics, change in appetite and fluid intake all can affect your bowels.  In order to avoid complications following surgery, here are some recommendations in order to help you during your recovery period.  Colace (docusate) - Pick up an over-the-counter form of Colace or another stool softener and take twice a day as long as you are requiring postoperative pain medications.  Take with a full glass of water daily.  If you experience loose stools or diarrhea, hold the colace until you stool forms back up. If your symptoms do not get better within 1 week or if they get worse, check with your doctor. Dulcolax (bisacodyl) - Pick up over-the-counter and take as directed by the product packaging as needed to assist with the movement of your bowels.  Take with a full glass of water.  Use this product as needed if not relieved by Colace only.  MiraLax (polyethylene glycol) - Pick up over-the-counter to have on hand. MiraLax is a solution that will increase the amount of water in your bowels to assist with bowel movements.  Take as directed and can mix with a glass of water, juice, soda, coffee, or tea. Take if you go more than two days without a movement. Do not use MiraLax more than once per day. Call your doctor if you are still constipated or irregular after using this medication for 7 days in a row.  If you continue to have problems with postoperative constipation, please  contact the office for further assistance and recommendations.  If you experience "the worst abdominal pain ever" or develop nausea or vomiting, please contact the office immediatly for further recommendations for treatment.  ITCHING If you experience itching with your medications, try taking only a single pain pill, or even half a pain pill at a time.  You can also use Benadryl over the counter for itching or also to help with sleep.   MEDICATIONS See your medication summary on the "After Visit Summary" that the nursing staff will review with you prior to discharge.  You may have some home medications which will be placed on hold until you complete the course of blood thinner medication.  It is important for you to complete the blood thinner medication as prescribed by your surgeon.  Continue your approved medications as instructed at time of discharge.  PRECAUTIONS If you experience chest pain or shortness of breath - call 911 immediately for transfer to the hospital emergency department.  If you develop a fever greater that 101 F, purulent drainage from wound, increased redness or  drainage from wound, foul odor from the wound/dressing, or calf pain - CONTACT YOUR SURGEON.                                                   FOLLOW-UP APPOINTMENTS Make sure you keep all of your appointments after your operation with your surgeon and caregivers. You should call the office at the above phone number and make an appointment for approximately two weeks after the date of your surgery or on the date instructed by your surgeon outlined in the "After Visit Summary".  RANGE OF MOTION AND STRENGTHENING EXERCISES  Rehabilitation of the knee is important following a knee injury or an operation. After just a few days of immobilization, the muscles of the thigh which control the knee become weakened and shrink (atrophy). Knee exercises are designed to build up the tone and strength of the thigh muscles and to  improve knee motion. Often times heat used for twenty to thirty minutes before working out will loosen up your tissues and help with improving the range of motion but do not use heat for the first two weeks following surgery. These exercises can be done on a training (exercise) mat, on the floor, on a table or on a bed. Use what ever works the best and is most comfortable for you Knee exercises include:  Leg Lifts - While your knee is still immobilized in a splint or cast, you can do straight leg raises. Lift the leg to 60 degrees, hold for 3 sec, and slowly lower the leg. Repeat 10-20 times 2-3 times daily. Perform this exercise against resistance later as your knee gets better.  Quad and Hamstring Sets - Tighten up the muscle on the front of the thigh (Quad) and hold for 5-10 sec. Repeat this 10-20 times hourly. Hamstring sets are done by pushing the foot backward against an object and holding for 5-10 sec. Repeat as with quad sets.  Leg Slides: Lying on your back, slowly slide your foot toward your buttocks, bending your knee up off the floor (only go as far as is comfortable). Then slowly slide your foot back down until your leg is flat on the floor again. Angel Wings: Lying on your back spread your legs to the side as far apart as you can without causing discomfort.  A rehabilitation program following serious knee injuries can speed recovery and prevent re-injury in the future due to weakened muscles. Contact your doctor or a physical therapist for more information on knee rehabilitation.   POST-OPERATIVE OPIOID TAPER INSTRUCTIONS: It is important to wean off of your opioid medication as soon as possible. If you do not need pain medication after your surgery it is ok to stop day one. Opioids include: Codeine, Hydrocodone(Norco, Vicodin), Oxycodone(Percocet, oxycontin) and hydromorphone amongst others.  Long term and even short term use of opiods can cause: Increased pain  response Dependence Constipation Depression Respiratory depression And more.  Withdrawal symptoms can include Flu like symptoms Nausea, vomiting And more Techniques to manage these symptoms Hydrate well Eat regular healthy meals Stay active Use relaxation techniques(deep breathing, meditating, yoga) Do Not substitute Alcohol to help with tapering If you have been on opioids for less than two weeks and do not have pain than it is ok to stop all together.  Plan to wean off of opioids This plan  should start within one week post op of your joint replacement. Maintain the same interval or time between taking each dose and first decrease the dose.  Cut the total daily intake of opioids by one tablet each day Next start to increase the time between doses. The last dose that should be eliminated is the evening dose.   IF YOU ARE TRANSFERRED TO A SKILLED REHAB FACILITY If the patient is transferred to a skilled rehab facility following release from the hospital, a list of the current medications will be sent to the facility for the patient to continue.  When discharged from the skilled rehab facility, please have the facility set up the patient's [place] prior to being released. Also, the skilled facility will be responsible for providing the patient with their medications at time of release from the facility to include their pain medication, the muscle relaxants, and their blood thinner medication. If the patient is still at the rehab facility at time of the two week follow up appointment, the skilled rehab facility will also need to assist the patient in arranging follow up appointment in our office and any transportation needs.  MAKE SURE YOU:  Understand these instructions.  Get help right away if you are not doing well or get worse.   DENTAL ANTIBIOTICS:  In most cases prophylactic antibiotics for Dental procdeures after total joint surgery are not  necessary.  Exceptions are as follows:  1. History of prior total joint infection  2. Severely immunocompromised (Organ Transplant, cancer chemotherapy, Rheumatoid biologic medications such as [place])  3. Poorly controlled diabetes (A1C &gt; 8.0, blood glucose over 200)  If you have one of these conditions, contact your surgeon for an antibiotic prescription, prior to your dental procedure.    Pick up stool softner and laxative for home use following surgery while on pain medications. Do not submerge incision under water. Please use good hand washing techniques while changing dressing each day. May shower starting three days after surgery. Please use a clean towel to pat the incision dry following showers. Continue to use ice for pain and swelling after surgery. Do not use any lotions or creams on the incision until instructed by your surgeon.        Information on my medicine - XARELTO (Rivaroxaban)  This medication education was reviewed with me or my healthcare representative as part of my discharge preparation.  Why was Xarelto prescribed for you? Xarelto was prescribed for you to reduce the risk of blood clots forming after orthopedic surgery. The medical term for these abnormal blood clots is venous thromboembolism (VTE).  What do you need to know about xarelto ? Take your Xarelto ONCE DAILY at the same time every day. You may take it either with or without food.  If you have difficulty swallowing the tablet whole, you may crush it and mix in applesauce just prior to taking your dose.  Take Xarelto exactly as prescribed by your doctor and DO NOT stop taking Xarelto without talking to the doctor who prescribed the medication.  Stopping without other VTE prevention medication to take the place of Xarelto may increase your risk of developing a clot.  After discharge, you should have regular check-up appointments with your healthcare provider that is prescribing  your Xarelto.    What do you do if you miss a dose? If you miss a dose, take it as soon as you remember on the same day then continue your regularly scheduled once daily  regimen the next day. Do not take two doses of Xarelto on the same day.   Important Safety Information A possible side effect of Xarelto is bleeding. You should call your healthcare provider right away if you experience any of the following: Bleeding from an injury or your nose that does not stop. Unusual colored urine (red or dark brown) or unusual colored stools (red or black). Unusual bruising for unknown reasons. A serious fall or if you hit your head (even if there is no bleeding).  Some medicines may interact with Xarelto and might increase your risk of bleeding while on Xarelto. To help avoid this, consult your healthcare provider or pharmacist prior to using any new prescription or non-prescription medications, including herbals, vitamins, non-steroidal anti-inflammatory drugs (NSAIDs) and supplements.  This website has more information on Xarelto: [email or web address]

## 2022-11-23 NOTE — Op Note (Signed)
OPERATIVE REPORT-TOTAL KNEE ARTHROPLASTY   Pre-operative diagnosis- Osteoarthritis  Right knee(s)  Post-operative diagnosis- Osteoarthritis Right knee(s)  Procedure-  Right  Total Knee Arthroplasty  Surgeon- Dione Plover. Serrina Minogue, MD  Assistant- Jaynie Bream, PA-C   Anesthesia-   Adductor canal block and spinal  EBL- 25 ml   Drains None  Tourniquet time-  Total Tourniquet Time Documented: Thigh (Right) - 35 minutes Total: Thigh (Right) - 35 minutes     Complications- None  Condition-PACU - hemodynamically stable.   Brief Clinical Note  Timothy Mcdaniel is a 76 y.o. year old male with end stage OA of his right knee with progressively worsening pain and dysfunction. He has constant pain, with activity and at rest and significant functional deficits with difficulties even with ADLs. He has had extensive non-op management including analgesics, injections of cortisone and viscosupplements, and home exercise program, but remains in significant pain with significant dysfunction. Radiographs show bone on bone arthritis medial and patellofemoral. He presents now for right Total Knee Arthroplasty.     Procedure in detail---   The patient is brought into the operating room and positioned supine on the operating table. After successful administration of  Adductor canal block and spinal,   a tourniquet is placed high on the  Right thigh(s) and the lower extremity is prepped and draped in the usual sterile fashion. Time out is performed by the operating team and then the  Right lower extremity is wrapped in Esmarch, knee flexed and the tourniquet inflated to 300 mmHg.       A midline incision is made with a ten blade through the subcutaneous tissue to the level of the extensor mechanism. A fresh blade is used to make a medial parapatellar arthrotomy. Soft tissue over the proximal medial tibia is subperiosteally elevated to the joint line with a knife and into the semimembranosus bursa with a  Cobb elevator. Soft tissue over the proximal lateral tibia is elevated with attention being paid to avoiding the patellar tendon on the tibial tubercle. The patella is everted, knee flexed 90 degrees and the ACL and PCL are removed. Findings are bone on bone medial and patellofemoral with large global osteophytes        The drill is used to create a starting hole in the distal femur and the canal is thoroughly irrigated with sterile saline to remove the fatty contents. The 5 degree Right  valgus alignment guide is placed into the femoral canal and the distal femoral cutting block is pinned to remove 9 mm off the distal femur. Resection is made with an oscillating saw.      The tibia is subluxed forward and the menisci are removed. The extramedullary alignment guide is placed referencing proximally at the medial aspect of the tibial tubercle and distally along the second metatarsal axis and tibial crest. The block is pinned to remove 61m off the more deficient medial  side. Resection is made with an oscillating saw. Size 7is the most appropriate size for the tibia and the proximal tibia is prepared with the modular drill and keel punch for that size.      The femoral sizing guide is placed and size 6 is most appropriate. Rotation is marked off the epicondylar axis and confirmed by creating a rectangular flexion gap at 90 degrees. The size 6 cutting block is pinned in this rotation and the anterior, posterior and chamfer cuts are made with the oscillating saw. The intercondylar block is then placed and that cut  is made.      Trial size 7 tibial component, trial size 6 posterior stabilized femur and a 8  mm posterior stabilized rotating platform insert trial is placed. Full extension is achieved with excellent varus/valgus and anterior/posterior balance throughout full range of motion. The patella is everted and thickness measured to be 27  mm. Free hand resection is taken to 15 mm, a 41 template is placed, lug  holes are drilled, trial patella is placed, and it tracks normally. Osteophytes are removed off the posterior femur with the trial in place. All trials are removed and the cut bone surfaces prepared with pulsatile lavage. Cement is mixed and once ready for implantation, the size 7 tibial implant, size  6 posterior stabilized femoral component, and the size 41 patella are cemented in place and the patella is held with the clamp. The trial insert is placed and the knee held in full extension. The Exparel (20 ml mixed with 60 ml saline) is injected into the extensor mechanism, posterior capsule, medial and lateral gutters and subcutaneous tissues.  All extruded cement is removed and once the cement is hard the permanent 8 mm posterior stabilized rotating platform insert is placed into the tibial tray.      The wound is copiously irrigated with saline solution and the extensor mechanism closed with # 0 Stratofix suture. The tourniquet is released for a total tourniquet time of 36  minutes. Flexion against gravity is 140 degrees and the patella tracks normally. Subcutaneous tissue is closed with 2.0 vicryl and subcuticular with running 4.0 Monocryl. The incision is cleaned and dried and steri-strips and a bulky sterile dressing are applied. The limb is placed into a knee immobilizer and the patient is awakened and transported to recovery in stable condition.      Please note that a surgical assistant was a medical necessity for this procedure in order to perform it in a safe and expeditious manner. Surgical assistant was necessary to retract the ligaments and vital neurovascular structures to prevent injury to them and also necessary for proper positioning of the limb to allow for anatomic placement of the prosthesis.   Dione Plover Samauri Kellenberger, MD    11/23/2022, 2:46 PM

## 2022-11-23 NOTE — Anesthesia Procedure Notes (Signed)
Spinal  Patient location during procedure: OR End time: 11/23/2022 1:44 PM Reason for block: surgical anesthesia Staffing Performed: resident/CRNA  Resident/CRNA: Noralyn Pick D, CRNA Performed by: Emilie Carp, Clinical cytogeneticist D, CRNA Authorized by: Brennan Bailey, MD   Preanesthetic Checklist Completed: patient identified, IV checked, site marked, risks and benefits discussed, surgical consent, monitors and equipment checked, pre-op evaluation and timeout performed Spinal Block Patient position: sitting Prep: DuraPrep Patient monitoring: heart rate, continuous pulse ox and blood pressure Approach: midline Location: L3-4 Injection technique: single-shot Needle Needle type: Pencan  Needle gauge: 24 G Needle length: 9 cm Assessment Sensory level: T6 Events: CSF return

## 2022-11-23 NOTE — Plan of Care (Signed)
  Problem: Education: Goal: Knowledge of the prescribed therapeutic regimen will improve Outcome: Progressing   Problem: Activity: Goal: Range of joint motion will improve Outcome: Progressing   Problem: Pain Management: Goal: Pain level will decrease with appropriate interventions Outcome: Progressing   Problem: Safety: Goal: Ability to remain free from injury will improve Outcome: Progressing   

## 2022-11-23 NOTE — Care Plan (Signed)
Ortho Bundle Case Management Note  Patient Details  Name: Timothy Mcdaniel MRN: [id number] Date of Birth: 1946-05-14  R TKA on 11-23-22 DCP:  Home with wife DME:  No needs, has a RW PT:  Claud Kelp on 11-27-22                   DME Arranged:  N/A DME Agency:  NA  HH Arranged:  NA HH Agency:  NA  Additional Comments: Please contact me with any questions of if this plan should need to change.  Dario Ave, Case Manager, [facility], [phone]. 11/23/2022, 1:18 PM

## 2022-11-23 NOTE — Anesthesia Postprocedure Evaluation (Signed)
Anesthesia Post Note  Patient: Timothy Mcdaniel  Procedure(s) Performed: TOTAL KNEE ARTHROPLASTY (Right: Knee)     Patient location during evaluation: PACU Anesthesia Type: Spinal Level of consciousness: awake and alert Pain management: pain level controlled Vital Signs Assessment: post-procedure vital signs reviewed and stable Respiratory status: spontaneous breathing, nonlabored ventilation and respiratory function stable Cardiovascular status: blood pressure returned to baseline Postop Assessment: no apparent nausea or vomiting, spinal receding, no headache and no backache Anesthetic complications: no   No notable events documented.  Last Vitals:  Vitals:   11/23/22 1600 11/23/22 1615  BP: 125/76 125/75  Pulse: (!) 56 (!) 54  Resp: 14 12  Temp:    SpO2: 97% 95%    Last Pain:  Vitals:   11/23/22 1615  TempSrc:   PainSc: 0-No pain                 Marthenia Rolling

## 2022-11-23 NOTE — Transfer of Care (Signed)
Immediate Anesthesia Transfer of Care Note  Patient: Timothy Mcdaniel  Procedure(s) Performed: TOTAL KNEE ARTHROPLASTY (Right: Knee)  Patient Location: PACU  Anesthesia Type:Regional and Spinal  Level of Consciousness: awake, alert , and oriented  Airway & Oxygen Therapy: Patient Spontanous Breathing and Patient connected to face mask oxygen  Post-op Assessment: Report given to RN and Post -op Vital signs reviewed and stable  Post vital signs: Reviewed and stable  Last Vitals:  Vitals Value Taken Time  BP 122/97 11/23/22 1507  Temp 36.4 C 11/23/22 1507  Pulse 66 11/23/22 1509  Resp 11 11/23/22 1509  SpO2 100 % 11/23/22 1509  Vitals shown include unvalidated device data.  Last Pain:  Vitals:   11/23/22 1255  TempSrc:   PainSc: 0-No pain         Complications: No notable events documented.

## 2022-11-24 DIAGNOSIS — M1711 Unilateral primary osteoarthritis, right knee: Secondary | ICD-10-CM | POA: Diagnosis not present

## 2022-11-24 DIAGNOSIS — Z96652 Presence of left artificial knee joint: Secondary | ICD-10-CM | POA: Diagnosis not present

## 2022-11-24 DIAGNOSIS — Z8551 Personal history of malignant neoplasm of bladder: Secondary | ICD-10-CM | POA: Diagnosis not present

## 2022-11-24 DIAGNOSIS — Z79899 Other long term (current) drug therapy: Secondary | ICD-10-CM | POA: Diagnosis not present

## 2022-11-24 DIAGNOSIS — Z8673 Personal history of transient ischemic attack (TIA), and cerebral infarction without residual deficits: Secondary | ICD-10-CM | POA: Diagnosis not present

## 2022-11-24 DIAGNOSIS — I10 Essential (primary) hypertension: Secondary | ICD-10-CM | POA: Diagnosis not present

## 2022-11-24 LAB — BASIC METABOLIC PANEL
Anion gap: 9 (ref 5–15)
BUN: 18 mg/dL (ref 8–23)
CO2: 23 mmol/L (ref 22–32)
Calcium: 8.9 mg/dL (ref 8.9–10.3)
Chloride: 108 mmol/L (ref 98–111)
Creatinine, Ser: 1 mg/dL (ref 0.61–1.24)
GFR, Estimated: >60 mL/min (ref >60)
Glucose, Bld: 194 mg/dL — ABNORMAL HIGH (ref 70–99)
Potassium: 4.1 mmol/L (ref 3.5–5.1)
Sodium: 140 mmol/L (ref 135–145)

## 2022-11-24 LAB — CBC
HCT: 37.1 % — ABNORMAL LOW (ref 39.0–52.0)
Hemoglobin: 12.1 g/dL — ABNORMAL LOW (ref 13.0–17.0)
MCH: 30.3 pg (ref 26.0–34.0)
MCHC: 32.6 g/dL (ref 30.0–36.0)
MCV: 92.8 fL (ref 80.0–100.0)
Platelets: 225 10*3/uL (ref 150–400)
RBC: 4 MIL/uL — ABNORMAL LOW (ref 4.22–5.81)
RDW: 14.6 % (ref 11.5–15.5)
WBC: 10.1 10*3/uL (ref 4.0–10.5)
nRBC: 0 % (ref 0.0–0.2)

## 2022-11-24 MED ORDER — METHOCARBAMOL 500 MG PO TABS: 500.0000 mg | ORAL_TABLET | Freq: Four times a day (QID) | ORAL | 0 refills | Status: DC | PRN

## 2022-11-24 MED ORDER — OXYCODONE HCL 5 MG PO TABS: 5.0000 mg | ORAL_TABLET | Freq: Four times a day (QID) | ORAL | 0 refills | Status: DC | PRN

## 2022-11-24 MED ORDER — RIVAROXABAN 10 MG PO TABS: 10.0000 mg | ORAL_TABLET | Freq: Every day | ORAL | 0 refills | Status: AC

## 2022-11-24 NOTE — TOC Transition Note (Signed)
Transition of Care [facility]) - CM/SW Discharge Note   Patient Details  Name: LINSEY HIROTA MRN: [id number] Date of Birth: December 03, 1946  Transition of Care [facility]) CM/SW Contact:  Lennart Pall, LCSW Phone Number: 11/24/2022, 9:38 AM   Clinical Narrative:     Met briefly with pt and confirming he has needed DME at home.  OPPT already arranged with [facility] ([place]). No TOC needs.  Final next level of care: OP Rehab Barriers to Discharge: No Barriers Identified   Patient Goals and CMS Choice Patient states their goals for this hospitalization and ongoing recovery are:: return home      Discharge Placement                       Discharge Plan and Services                DME Arranged: N/A DME Agency: NA       HH Arranged: NA HH Agency: NA        Social Determinants of Health (SDOH) Interventions     Readmission Risk Interventions     No data to display

## 2022-11-24 NOTE — Progress Notes (Signed)
Patient discharged to home w/ family. Given all belongings, instructions. Verbalized understanding of instructions. Escorted to pov via w/c. 

## 2022-11-24 NOTE — Progress Notes (Signed)
   Subjective: 1 Day Post-Op Procedure(s) (LRB): TOTAL KNEE ARTHROPLASTY (Right) Patient reports pain as mild.   Patient seen in rounds by Dr. Wynelle Link. Patient is well, and has had no acute complaints or problems No issues overnight. Denies chest pain, SOB, or calf pain. Foley catheter removed this AM.  We will begin therapy today   Objective: Vital signs in last 24 hours: Temp:  [97.5 F (36.4 C)-98.8 F (37.1 C)] 97.6 F (36.4 C) (12/12 0536) Pulse Rate:  [49-88] 61 (12/12 0536) Resp:  [10-20] 17 (12/12 0536) BP: (110-159)/(60-97) 127/70 (12/12 0536) SpO2:  [94 %-100 %] 98 % (12/12 0536) Weight:  [120.2 kg] 120.2 kg (12/11 1114)  Intake/Output from previous day:  Intake/Output Summary (Last 24 hours) at 11/24/2022 0744 Last data filed at 11/24/2022 0600 Gross per 24 hour  Intake 3387.1 ml  Output 3300 ml  Net 87.1 ml     Intake/Output this shift: No intake/output data recorded.  Labs: Recent Labs    11/24/22 0324  HGB 12.1*   Recent Labs    11/24/22 0324  WBC 10.1  RBC 4.00*  HCT 37.1*  PLT 225   Recent Labs    11/24/22 0324  NA 140  K 4.1  CL 108  CO2 23  BUN 18  CREATININE 1.00  GLUCOSE 194*  CALCIUM 8.9   No results for input(s): "LABPT", "INR" in the last 72 hours.  Exam: General - Patient is Alert and Oriented Extremity - Neurologically intact Neurovascular intact Sensation intact distally Dorsiflexion/Plantar flexion intact Dressing - dressing C/D/I Motor Function - intact, moving foot and toes well on exam.   Past Medical History:  Diagnosis Date   Arthritis    OA- both knees , L shoulder    Exogenous obesity    Hypercholesteremia    Hypertension    Nonischemic cardiomyopathy (HCC)    OA (osteoarthritis)    OSA on CPAP    Recurrent upper respiratory infection (URI)    DEC. had flu shot- followed by bad cold, treated /w OTC   Shortness of breath    Stroke ([place])    ministroke    Assessment/Plan: 1 Day Post-Op Procedure(s)  (LRB): TOTAL KNEE ARTHROPLASTY (Right) Principal Problem:   OA (osteoarthritis) of knee Active Problems:   Osteoarthritis of right knee  Estimated body mass index is 35.94 kg/m as calculated from the following:   Height as of this encounter: 6' (1.829 m).   Weight as of this encounter: 120.2 kg. Advance diet Up with therapy D/C IV fluids   Patient's anticipated LOS is less than 2 midnights, meeting these requirements: - Lives within 1 hour of care - Has a competent adult at home to recover with post-op recover - NO history of  - Chronic pain requiring opioids  - Coronary Artery Disease  - Heart failure  - Heart attack  - DVT/VTE  - Cardiac arrhythmia  - Respiratory Failure/COPD  - Renal failure  - Anemia  - Advanced Liver disease  DVT Prophylaxis - Xarelto Weight bearing as tolerated. Begin therapy.  Plan is to go Home after hospital stay. Plan for discharge later today if progresses with therapy and meeting goals. Scheduled for OPPT at [facility]). Follow-up in the office in 2 weeks.  The PDMP database was reviewed today prior to any opioid medications being prescribed to this patient.  Theresa Duty, PA-C Orthopedic Surgery [phone] 11/24/2022, 7:44 AM

## 2022-11-24 NOTE — Progress Notes (Signed)
Physical Therapy Treatment Patient Details Name: Timothy Mcdaniel MRN: [id number] DOB: [birth year] Today's Date: 11/24/2022   History of Present Illness 76 yo male s/p R TKA 11/23/22    PT Comments    2nd session to continue gait and stair training. Encouraged pt to ambulate often at home and to perform seated knee flexion exercises 2x/day, 10 reps. All PT education completed.    Recommendations for follow up therapy are one component of a multi-disciplinary discharge planning process, led by the attending physician.  Recommendations may be updated based on patient status, additional functional criteria and insurance authorization.  Follow Up Recommendations  Follow physician's recommendations for discharge plan and follow up therapies     Assistance Recommended at Discharge Frequent or constant Supervision/Assistance  Patient can return home with the following A little help with walking and/or transfers;A little help with bathing/dressing/bathroom;Assistance with cooking/housework;Assist for transportation;Help with stairs or ramp for entrance   Equipment Recommendations  None recommended by PT    Recommendations for Other Services       Precautions / Restrictions Precautions Precautions: Fall;Knee Restrictions Weight Bearing Restrictions: No RLE Weight Bearing: Weight bearing as tolerated     Mobility  Bed Mobility Overal bed mobility: Needs Assistance Bed Mobility: Supine to Sit          General bed mobility comments: oob in recliner    Transfers Overall transfer level: Needs assistance Equipment used: Rolling walker (2 wheels) Transfers: Sit to/from Stand Sit to Stand: Supervision           General transfer comment: Supv for safety. Cues for safety, hand/LE placement.    Ambulation/Gait Ambulation/Gait assistance: Min guard Gait Distance (Feet): 75 Feet Assistive device: Rolling walker (2 wheels) Gait Pattern/deviations: Step-through pattern,  Decreased stride length       General Gait Details: Min guard for safety. Pt now using step thru gait pattern.   Stairs Stairs: Yes Stairs assistance: Min assist Stair Management: Step to pattern, Forwards, With walker Number of Stairs: 2 (+ 1 stair step) General stair comments: Cues for safety, technique, sequence. First, practiced ascending/descending 1 stair step with RW. Then practiced ascending/descending portable stairs (up and over x 1), forwards with RW. Instructed pt that wife will have to stabilize walker for him for safety. Min A to stabilize walker only.   Wheelchair Mobility    Modified Rankin (Stroke Patients Only)       Balance Overall balance assessment: Needs assistance   Sitting balance-Leahy Scale: Normal     Standing balance support: Bilateral upper extremity supported, Reliant on assistive device for balance, During functional activity Standing balance-Leahy Scale: Poor                              Cognition Arousal/Alertness: Awake/alert Behavior During Therapy: WFL for tasks assessed/performed Overall Cognitive Status: Within Functional Limits for tasks assessed                                          Exercises Total Joint Exercises Ankle Circles/Pumps: AROM, Both, 10 reps Quad Sets: AROM, Both, 10 reps Heel Slides: AROM, Right, 10 reps Hip ABduction/ADduction: AROM, Right, 10 reps Straight Leg Raises: AROM, Right, 10 reps Knee Flexion: AROM, Right, 5 reps, Seated Goniometric ROM: ~5-65 degrees    General Comments        Pertinent Vitals/Pain  Pain Assessment Pain Assessment: 0-10 Pain Score: 6  Pain Location: R knee Pain Descriptors / Indicators: Discomfort, Aching, Sore Pain Intervention(s): Limited activity within patient's tolerance, Monitored during session    Home Living                          Prior Function            PT Goals (current goals can now be found in the care plan  section) Progress towards PT goals: Progressing toward goals    Frequency    7X/week      PT Plan Current plan remains appropriate    Co-evaluation              AM-PAC PT "6 Clicks" Mobility   Outcome Measure  Help needed turning from your back to your side while in a flat bed without using bedrails?: A Little Help needed moving from lying on your back to sitting on the side of a flat bed without using bedrails?: A Little Help needed moving to and from a bed to a chair (including a wheelchair)?: A Little Help needed standing up from a chair using your arms (e.g., wheelchair or bedside chair)?: A Little Help needed to walk in hospital room?: A Little Help needed climbing 3-5 steps with a railing? : A Little 6 Click Score: 18    End of Session Equipment Utilized During Treatment: Gait belt Activity Tolerance: Patient tolerated treatment well Patient left: in chair;with call bell/phone within reach   PT Visit Diagnosis: Pain;Other abnormalities of gait and mobility (R26.89) Pain - Right/Left: Right Pain - part of body: Knee     Time: 5003-7048 PT Time Calculation (min) (ACUTE ONLY): 11 min  Charges:  $Gait Training: 8-22 mins                        Doreatha Massed, PT Acute Rehabilitation  Office: [phone]

## 2022-11-24 NOTE — Evaluation (Signed)
Physical Therapy Evaluation Patient Details Name: Timothy Mcdaniel MRN: [id number] DOB: Nov 05, 1946 Today's Date: 11/24/2022  History of Present Illness  76 yo male s/p R TKA 11/23/22  Clinical Impression  On eval, pt was Min guard A for mobility. He walked ~60 feet with a RW. Pt rated pain 5/10 at rest and 8/10 with activity. Will plan to have a 2nd session prior to d/c home later today if pt meets her PT goals.        Recommendations for follow up therapy are one component of a multi-disciplinary discharge planning process, led by the attending physician.  Recommendations may be updated based on patient status, additional functional criteria and insurance authorization.  Follow Up Recommendations Follow physician's recommendations for discharge plan and follow up therapies      Assistance Recommended at Discharge Frequent or constant Supervision/Assistance  Patient can return home with the following  A little help with walking and/or transfers;A little help with bathing/dressing/bathroom;Assistance with cooking/housework;Assist for transportation;Help with stairs or ramp for entrance    Equipment Recommendations None recommended by PT  Recommendations for Other Services       Functional Status Assessment Patient has had a recent decline in their functional status and demonstrates the ability to make significant improvements in function in a reasonable and predictable amount of time.     Precautions / Restrictions Precautions Precautions: Fall;Knee Restrictions Weight Bearing Restrictions: No RLE Weight Bearing: Weight bearing as tolerated      Mobility  Bed Mobility Overal bed mobility: Needs Assistance Bed Mobility: Supine to Sit     Supine to sit: Supervision, HOB elevated          Transfers Overall transfer level: Needs assistance Equipment used: Rolling walker (2 wheels) Transfers: Sit to/from Stand Sit to Stand: Min guard           General transfer  comment: Min guard for safety. Cues for safety, hand/LE placement.    Ambulation/Gait Ambulation/Gait assistance: Min guard Gait Distance (Feet): 60 Feet Assistive device: Rolling walker (2 wheels) Gait Pattern/deviations: Step-to pattern, Antalgic       General Gait Details: Cues for safety, technique, sequence, RW proximity. Distance limited by pt report of pain 8/10.  Stairs            Wheelchair Mobility    Modified Rankin (Stroke Patients Only)       Balance Overall balance assessment: Needs assistance   Sitting balance-Leahy Scale: Normal     Standing balance support: Bilateral upper extremity supported, Reliant on assistive device for balance, During functional activity Standing balance-Leahy Scale: Poor                               Pertinent Vitals/Pain Pain Assessment Pain Assessment: 0-10 Pain Score: 8  Pain Location: "8" with ambulation, "5" at rest Pain Descriptors / Indicators: Aching, Sharp Pain Intervention(s): Monitored during session, Limited activity within patient's tolerance, Ice applied    Home Living Family/patient expects to be discharged to:: Private residence Living Arrangements: Spouse/significant other Available Help at Discharge: Family Type of Home: House Home Access: Stairs to enter Entrance Stairs-Rails: None Entrance Stairs-Number of Steps: 1+3   Home Layout: Laundry or work area in basement;Able to live on main level with bedroom/bathroom Home Equipment: Conservation officer, nature (2 wheels);Cane - single point      Prior Function Prior Level of Function : Independent/Modified Independent  Hand Dominance        Extremity/Trunk Assessment   Upper Extremity Assessment Upper Extremity Assessment: Overall WFL for tasks assessed    Lower Extremity Assessment Lower Extremity Assessment: Generalized weakness    Cervical / Trunk Assessment Cervical / Trunk Assessment: Normal   Communication   Communication: No difficulties  Cognition Arousal/Alertness: Awake/alert Behavior During Therapy: WFL for tasks assessed/performed Overall Cognitive Status: Within Functional Limits for tasks assessed                                          General Comments      Exercises Total Joint Exercises Ankle Circles/Pumps: AROM, Both, 10 reps Quad Sets: AROM, Both, 10 reps Heel Slides: AROM, Right, 10 reps Hip ABduction/ADduction: AROM, Right, 10 reps Straight Leg Raises: AROM, Right, 10 reps Goniometric ROM: ~5-65 degrees   Assessment/Plan    PT Assessment Patient needs continued PT services  PT Problem List Decreased strength;Decreased range of motion;Decreased activity tolerance;Decreased balance;Decreased mobility;Pain;Decreased knowledge of use of DME       PT Treatment Interventions DME instruction;Gait training;Therapeutic exercise;Balance training;Functional mobility training;Therapeutic activities;Patient/family education    PT Goals (Current goals can be found in the Care Plan section)  Acute Rehab PT Goals Patient Stated Goal: less pain. regain PLOF/independence PT Goal Formulation: With patient Time For Goal Achievement: 12/08/22 Potential to Achieve Goals: Good    Frequency 7X/week     Co-evaluation               AM-PAC PT "6 Clicks" Mobility  Outcome Measure Help needed turning from your back to your side while in a flat bed without using bedrails?: A Little Help needed moving from lying on your back to sitting on the side of a flat bed without using bedrails?: A Little Help needed moving to and from a bed to a chair (including a wheelchair)?: A Little Help needed standing up from a chair using your arms (e.g., wheelchair or bedside chair)?: A Little Help needed to walk in hospital room?: A Little Help needed climbing 3-5 steps with a railing? : A Lot 6 Click Score: 17    End of Session Equipment Utilized During  Treatment: Gait belt Activity Tolerance: Patient tolerated treatment well Patient left: in chair;with call bell/phone within reach   PT Visit Diagnosis: Pain;Other abnormalities of gait and mobility (R26.89) Pain - Right/Left: Right Pain - part of body: Knee    Time: 0825-0848 PT Time Calculation (min) (ACUTE ONLY): 23 min   Charges:   PT Evaluation $PT Eval Low Complexity: 1 Low PT Treatments $Gait Training: 8-22 mins           Doreatha Massed, PT Acute Rehabilitation  Office: [phone]

## 2022-11-25 ENCOUNTER — Encounter (HOSPITAL_COMMUNITY): Payer: Self-pay | Admitting: Orthopedic Surgery

## 2022-11-27 ENCOUNTER — Ambulatory Visit: Payer: Self-pay | Admitting: *Deleted

## 2022-11-27 ENCOUNTER — Encounter: Payer: Self-pay | Admitting: *Deleted

## 2022-11-27 DIAGNOSIS — M25561 Pain in right knee: Secondary | ICD-10-CM | POA: Diagnosis not present

## 2022-11-27 DIAGNOSIS — M25661 Stiffness of right knee, not elsewhere classified: Secondary | ICD-10-CM | POA: Diagnosis not present

## 2022-11-27 DIAGNOSIS — R269 Unspecified abnormalities of gait and mobility: Secondary | ICD-10-CM | POA: Diagnosis not present

## 2022-11-27 NOTE — Patient Outreach (Signed)
  Care Coordination   Initial Visit Note   11/27/2022  Name: EUDELL MCPHEE MRN: [id number] DOB: 09-Nov-1946  Maggie Schwalbe is a 76 y.o. year old male who sees Redmond School, MD for primary care. I spoke with Maggie Schwalbe by phone today.  What matters to the patients health and wellness today?   No Interventions Identified.  SDOH assessments and interventions completed:  Yes.  SDOH Interventions Today    Flowsheet Row Most Recent Value  SDOH Interventions   Food Insecurity Interventions Intervention Not Indicated  Housing Interventions Intervention Not Indicated  Transportation Interventions Patient Resources (Friends/Family), Intervention Not Indicated  Utilities Interventions Intervention Not Indicated  Alcohol Usage Interventions Intervention Not Indicated (Score <7)  Financial Strain Interventions Intervention Not Indicated  Physical Activity Interventions Intervention Not Indicated  Stress Interventions Intervention Not Indicated  Social Connections Interventions Intervention Not Indicated     Care Coordination Interventions:  Yes, provided.   Follow up plan: No further intervention required.   Encounter Outcome:  Pt. Visit Completed.   Nat Christen, BSW, MSW, LCSW  Licensed Education officer, [facility]  Mailing [place] [address] [place], [place] [address] Physical Address-[address], [place],  [address] Toll Free Main # [phone] Fax # [phone] Cell # [phone] [email or web address]

## 2022-11-27 NOTE — Patient Instructions (Signed)
Visit Information  Thank you for taking time to visit with me today. Please don't hesitate to contact me if I can be of assistance to you.   Please call the care guide team at [phone] if you need to cancel or reschedule your appointment.   If you are experiencing a Mental Health or Behavioral Health Crisis or need someone to talk to, please call the Suicide and Crisis Lifeline: 988 call the [place] National Suicide Prevention Lifeline: 1-800-273-8255 or TTY: 1-800-799-4 TTY (1-800-799-4889) to talk to a trained counselor call 1-800-273-TALK (toll free, 24 hour hotline) go to [facility] [address], [place] ([phone]) call the [place] Crisis Line: 800-939-9988 call 911  Patient verbalizes understanding of instructions and care plan provided today and agrees to view in MyChart. Active MyChart status and patient understanding of how to access instructions and care plan via MyChart confirmed with patient.     No further follow up required.  Kadin Canipe, BSW, MSW, LCSW  Licensed Clinical Social Worker  [facility]  Mailing Address-[address], [place], [place] [address] Physical Address-[address], [place],  [address] Toll Free Main # [phone] Fax # [phone] Cell # [phone] [email or web address]            

## 2022-11-30 DIAGNOSIS — M25661 Stiffness of right knee, not elsewhere classified: Secondary | ICD-10-CM | POA: Diagnosis not present

## 2022-11-30 DIAGNOSIS — M25561 Pain in right knee: Secondary | ICD-10-CM | POA: Diagnosis not present

## 2022-11-30 DIAGNOSIS — R269 Unspecified abnormalities of gait and mobility: Secondary | ICD-10-CM | POA: Diagnosis not present

## 2022-12-01 NOTE — Discharge Summary (Signed)
Patient ID: Timothy Mcdaniel MRN: [id number] DOB/AGE: 77-04-1946 77 y.o.  Admit date: 11/23/2022 Discharge date: 11/24/2022  Admission Diagnoses:  Principal Problem:   OA (osteoarthritis) of knee Active Problems:   Osteoarthritis of right knee   Discharge Diagnoses:  Same  Past Medical History:  Diagnosis Date   Arthritis    OA- both knees , L shoulder    Exogenous obesity    Hypercholesteremia    Hypertension    Nonischemic cardiomyopathy (HCC)    OA (osteoarthritis)    OSA on CPAP    Recurrent upper respiratory infection (URI)    DEC. had flu shot- followed by bad cold, treated /w OTC   Shortness of breath    Stroke [facility])    ministroke    Surgeries: Procedure(s): TOTAL KNEE ARTHROPLASTY on 11/23/2022   Consultants:   Discharged Condition: Improved  Hospital Course: Timothy Mcdaniel is an 76 y.o. male who was admitted 11/23/2022 for operative treatment ofOA (osteoarthritis) of knee. Patient has severe unremitting pain that affects sleep, daily activities, and work/hobbies. After pre-op clearance the patient was taken to the operating room on 11/23/2022 and underwent  Procedure(s): TOTAL KNEE ARTHROPLASTY.    Patient was given perioperative antibiotics:  Anti-infectives (From admission, onward)    Start     Dose/Rate Route Frequency Ordered Stop   11/23/22 2000  ceFAZolin (ANCEF) IVPB 2g/100 mL premix        2 g 200 mL/hr over 30 Minutes Intravenous Every 6 hours 11/23/22 1720 11/24/22 0309   11/23/22 1130  ceFAZolin (ANCEF) IVPB 2g/100 mL premix  Status:  Discontinued        2 g 200 mL/hr over 30 Minutes Intravenous On call to O.R. 11/23/22 1116 11/23/22 1119   11/23/22 1130  ceFAZolin (ANCEF) IVPB 3g/100 mL premix        3 g 200 mL/hr over 30 Minutes Intravenous On call to O.R. 11/23/22 1119 11/23/22 1345        Patient was given sequential compression devices, early ambulation, and chemoprophylaxis to prevent DVT.  Patient benefited maximally from  hospital stay and there were no complications.    Recent vital signs: No data found.   Recent laboratory studies: No results for input(s): "WBC", "HGB", "HCT", "PLT", "NA", "K", "CL", "CO2", "BUN", "CREATININE", "GLUCOSE", "INR", "CALCIUM" in the last 72 hours.  Invalid input(s): "PT", "2"   Discharge Medications:   Allergies as of 11/24/2022   No Known Allergies      Medication List     STOP taking these medications    aspirin 81 MG tablet   CAL-MAG-ZINC PO   CENTRUM SILVER ADULT 50+ PO   CO Q 10 PO   KRILL OIL OMEGA-3 PO   TURMERIC PO   Vitamin D3 125 MCG (5000 UT) Caps       TAKE these medications    acetaminophen 650 MG CR tablet Commonly known as: TYLENOL Take 1,300 mg by mouth every 8 (eight) hours as needed for pain.   dorzolamide-timolol 2-0.5 % ophthalmic solution Commonly known as: COSOPT Place 1 drop into the right eye at bedtime.   fenofibrate 48 MG tablet Commonly known as: TRICOR TAKE ONE TABLET BY MOUTH ONCE DAILY   hydrochlorothiazide 25 MG tablet Commonly known as: HYDRODIURIL Take 25 mg by mouth daily.   lisinopril 40 MG tablet Commonly known as: ZESTRIL Take 40 mg by mouth daily.   methocarbamol 500 MG tablet Commonly known as: ROBAXIN Take 1 tablet (500 mg total) by mouth  every 6 (six) hours as needed for muscle spasms.   oxyCODONE 5 MG immediate release tablet Commonly known as: Oxy IR/ROXICODONE Take 1-2 tablets (5-10 mg total) by mouth every 6 (six) hours as needed for severe pain.   rivaroxaban 10 MG Tabs tablet Commonly known as: XARELTO Take 1 tablet (10 mg total) by mouth daily with breakfast for 20 days. Then resume one 81 mg aspirin once a day.   traMADol 50 MG tablet Commonly known as: ULTRAM Take 50 mg by mouth 2 (two) times daily.   traZODone 100 MG tablet Commonly known as: DESYREL Take 100 mg by mouth at bedtime.               Discharge Care Instructions  (From admission, onward)            Start     Ordered   11/24/22 0000  Weight bearing as tolerated        11/24/22 0747   11/24/22 0000  Change dressing       Comments: You may remove the bulky bandage (ACE wrap and gauze) two days after surgery. You will have an adhesive waterproof bandage underneath. Leave this in place until your first follow-up appointment.   11/24/22 0747            Diagnostic Studies: No results found.  Disposition: Discharge disposition: 01-Home or Self Care       Discharge Instructions     Call MD / Call 911   Complete by: As directed    If you experience chest pain or shortness of breath, CALL 911 and be transported to the hospital emergency room.  If you develope a fever above 101 F, pus (white drainage) or increased drainage or redness at the wound, or calf pain, call your surgeon's office.   Change dressing   Complete by: As directed    You may remove the bulky bandage (ACE wrap and gauze) two days after surgery. You will have an adhesive waterproof bandage underneath. Leave this in place until your first follow-up appointment.   Constipation Prevention   Complete by: As directed    Drink plenty of fluids.  Prune juice may be helpful.  You may use a stool softener, such as Colace (over the counter) 100 mg twice a day.  Use MiraLax (over the counter) for constipation as needed.   Diet - low sodium heart healthy   Complete by: As directed    Do not put a pillow under the knee. Place it under the heel.   Complete by: As directed    Driving restrictions   Complete by: As directed    No driving for two weeks   Post-operative opioid taper instructions:   Complete by: As directed    POST-OPERATIVE OPIOID TAPER INSTRUCTIONS: It is important to wean off of your opioid medication as soon as possible. If you do not need pain medication after your surgery it is ok to stop day one. Opioids include: Codeine, Hydrocodone(Norco, Vicodin), Oxycodone(Percocet, oxycontin) and hydromorphone amongst  others.  Long term and even short term use of opiods can cause: Increased pain response Dependence Constipation Depression Respiratory depression And more.  Withdrawal symptoms can include Flu like symptoms Nausea, vomiting And more Techniques to manage these symptoms Hydrate well Eat regular healthy meals Stay active Use relaxation techniques(deep breathing, meditating, yoga) Do Not substitute Alcohol to help with tapering If you have been on opioids for less than two weeks and do not have pain than  it is ok to stop all together.  Plan to wean off of opioids This plan should start within one week post op of your joint replacement. Maintain the same interval or time between taking each dose and first decrease the dose.  Cut the total daily intake of opioids by one tablet each day Next start to increase the time between doses. The last dose that should be eliminated is the evening dose.      TED hose   Complete by: As directed    Use stockings (TED hose) for three weeks on both leg(s).  You may remove them at night for sleeping.   Weight bearing as tolerated   Complete by: As directed         Follow-up Information     Nare Gaspari L, PA. Go on 12/08/2022.   Specialty: Orthopedic Surgery Why: You are scheduled for a follow up appointment on 12-08-22 at 1:45 pm. Contact information: [address] [place] [address] [phone]                  Signed: Theresa Duty 12/01/2022, 8:22 AM

## 2022-12-02 DIAGNOSIS — R269 Unspecified abnormalities of gait and mobility: Secondary | ICD-10-CM | POA: Diagnosis not present

## 2022-12-02 DIAGNOSIS — M25561 Pain in right knee: Secondary | ICD-10-CM | POA: Diagnosis not present

## 2022-12-02 DIAGNOSIS — M25661 Stiffness of right knee, not elsewhere classified: Secondary | ICD-10-CM | POA: Diagnosis not present

## 2022-12-04 DIAGNOSIS — M25661 Stiffness of right knee, not elsewhere classified: Secondary | ICD-10-CM | POA: Diagnosis not present

## 2022-12-04 DIAGNOSIS — R269 Unspecified abnormalities of gait and mobility: Secondary | ICD-10-CM | POA: Diagnosis not present

## 2022-12-04 DIAGNOSIS — M25561 Pain in right knee: Secondary | ICD-10-CM | POA: Diagnosis not present

## 2022-12-08 DIAGNOSIS — R269 Unspecified abnormalities of gait and mobility: Secondary | ICD-10-CM | POA: Diagnosis not present

## 2022-12-08 DIAGNOSIS — M25561 Pain in right knee: Secondary | ICD-10-CM | POA: Diagnosis not present

## 2022-12-08 DIAGNOSIS — M25661 Stiffness of right knee, not elsewhere classified: Secondary | ICD-10-CM | POA: Diagnosis not present

## 2022-12-10 DIAGNOSIS — M25661 Stiffness of right knee, not elsewhere classified: Secondary | ICD-10-CM | POA: Diagnosis not present

## 2022-12-10 DIAGNOSIS — R269 Unspecified abnormalities of gait and mobility: Secondary | ICD-10-CM | POA: Diagnosis not present

## 2022-12-10 DIAGNOSIS — M25561 Pain in right knee: Secondary | ICD-10-CM | POA: Diagnosis not present

## 2022-12-15 DIAGNOSIS — M25661 Stiffness of right knee, not elsewhere classified: Secondary | ICD-10-CM | POA: Diagnosis not present

## 2022-12-15 DIAGNOSIS — R269 Unspecified abnormalities of gait and mobility: Secondary | ICD-10-CM | POA: Diagnosis not present

## 2022-12-15 DIAGNOSIS — M25561 Pain in right knee: Secondary | ICD-10-CM | POA: Diagnosis not present

## 2022-12-17 DIAGNOSIS — M25561 Pain in right knee: Secondary | ICD-10-CM | POA: Diagnosis not present

## 2022-12-17 DIAGNOSIS — M25661 Stiffness of right knee, not elsewhere classified: Secondary | ICD-10-CM | POA: Diagnosis not present

## 2022-12-17 DIAGNOSIS — R269 Unspecified abnormalities of gait and mobility: Secondary | ICD-10-CM | POA: Diagnosis not present

## 2022-12-22 DIAGNOSIS — R269 Unspecified abnormalities of gait and mobility: Secondary | ICD-10-CM | POA: Diagnosis not present

## 2022-12-22 DIAGNOSIS — M25561 Pain in right knee: Secondary | ICD-10-CM | POA: Diagnosis not present

## 2022-12-22 DIAGNOSIS — M25661 Stiffness of right knee, not elsewhere classified: Secondary | ICD-10-CM | POA: Diagnosis not present

## 2022-12-24 DIAGNOSIS — M25561 Pain in right knee: Secondary | ICD-10-CM | POA: Diagnosis not present

## 2022-12-24 DIAGNOSIS — M25661 Stiffness of right knee, not elsewhere classified: Secondary | ICD-10-CM | POA: Diagnosis not present

## 2022-12-24 DIAGNOSIS — R269 Unspecified abnormalities of gait and mobility: Secondary | ICD-10-CM | POA: Diagnosis not present

## 2022-12-28 ENCOUNTER — Other Ambulatory Visit: Payer: Medicare Other | Admitting: Urology

## 2022-12-28 DIAGNOSIS — M25661 Stiffness of right knee, not elsewhere classified: Secondary | ICD-10-CM | POA: Diagnosis not present

## 2022-12-28 DIAGNOSIS — R269 Unspecified abnormalities of gait and mobility: Secondary | ICD-10-CM | POA: Diagnosis not present

## 2022-12-28 DIAGNOSIS — M25561 Pain in right knee: Secondary | ICD-10-CM | POA: Diagnosis not present

## 2022-12-29 DIAGNOSIS — Z5189 Encounter for other specified aftercare: Secondary | ICD-10-CM | POA: Diagnosis not present

## 2023-01-14 IMAGING — DX DG CHEST 2V
2 series · 2 of 2 positions shown · non-contrast
Comparison: Chest radiograph 10/10/2019.

CLINICAL DATA: Patient with cough for 2 months.

EXAM:
CHEST - 2 VIEW

[chest pa]
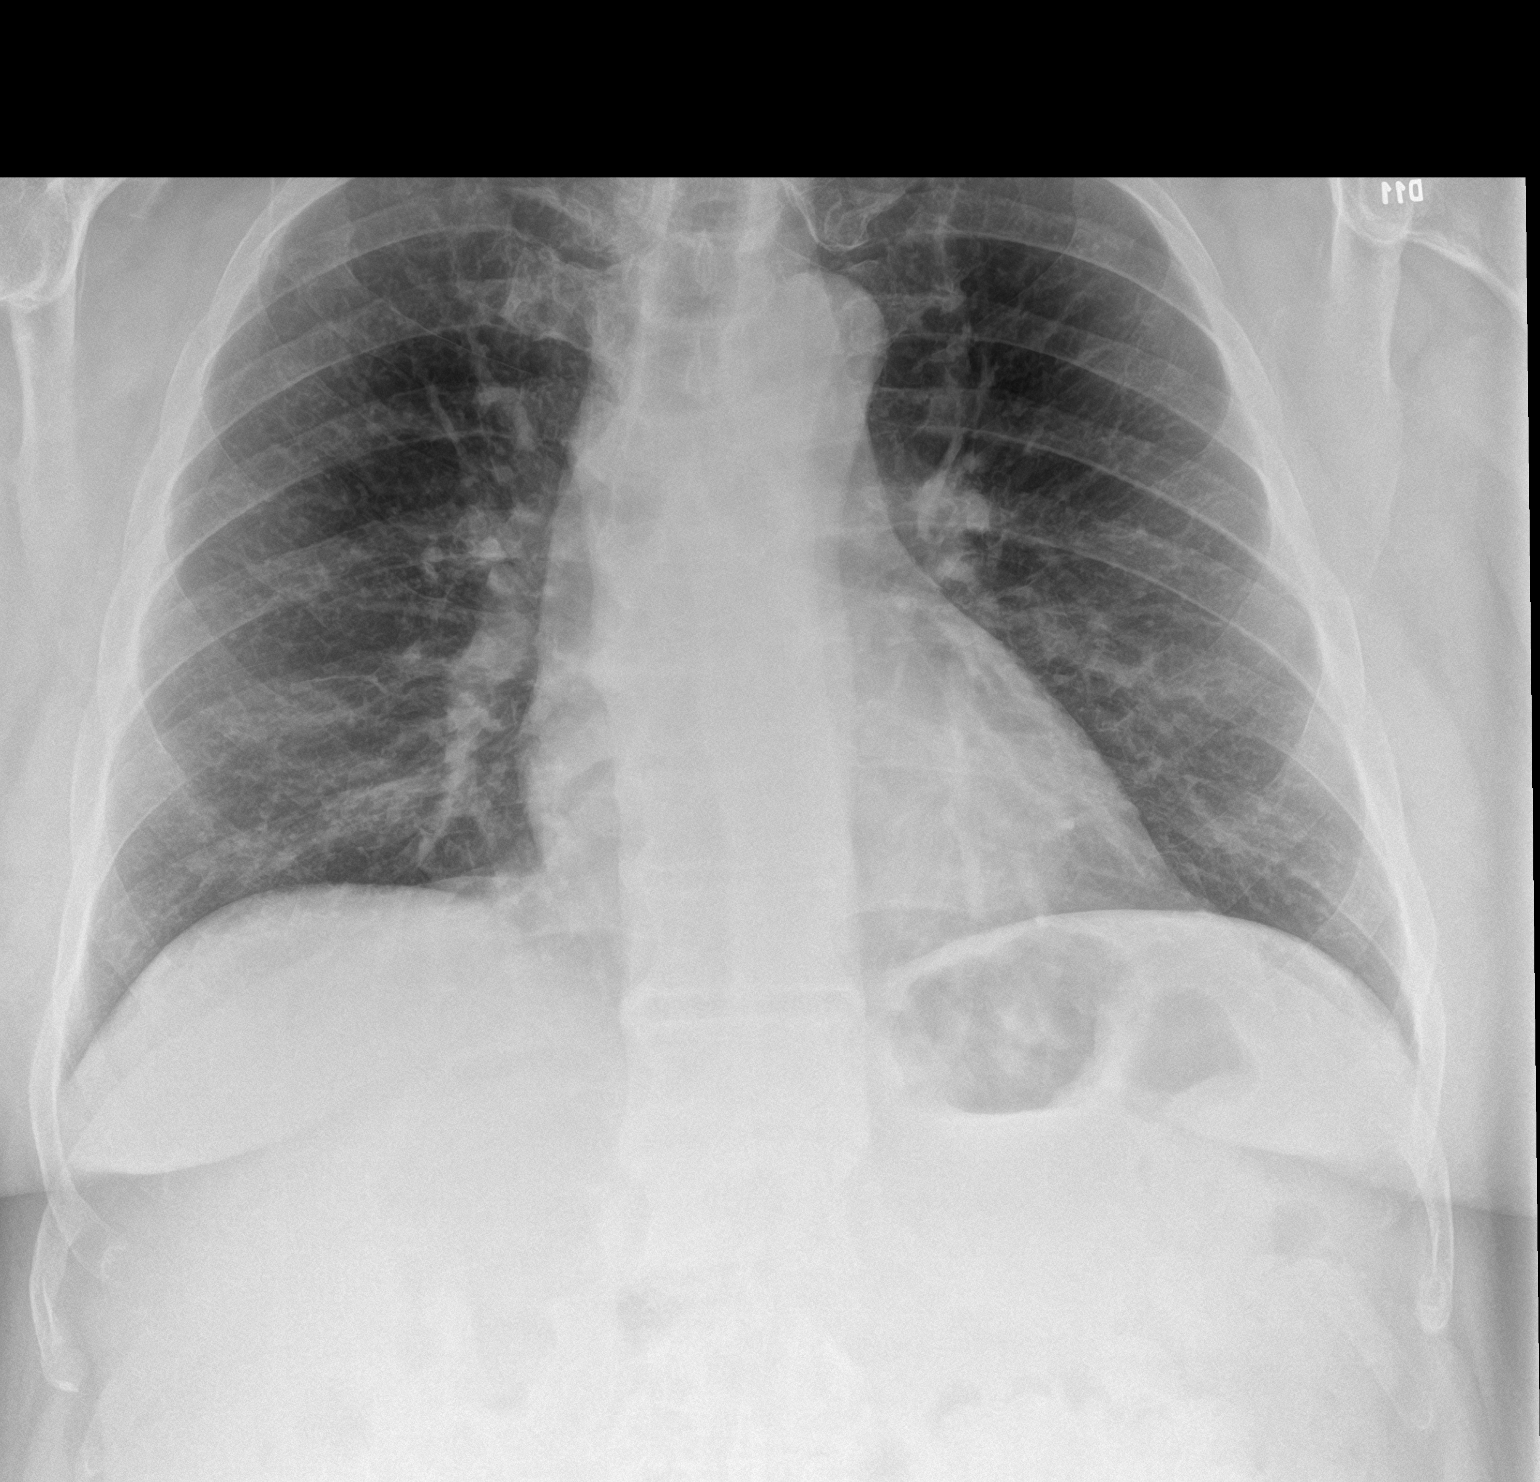

[chest lat]
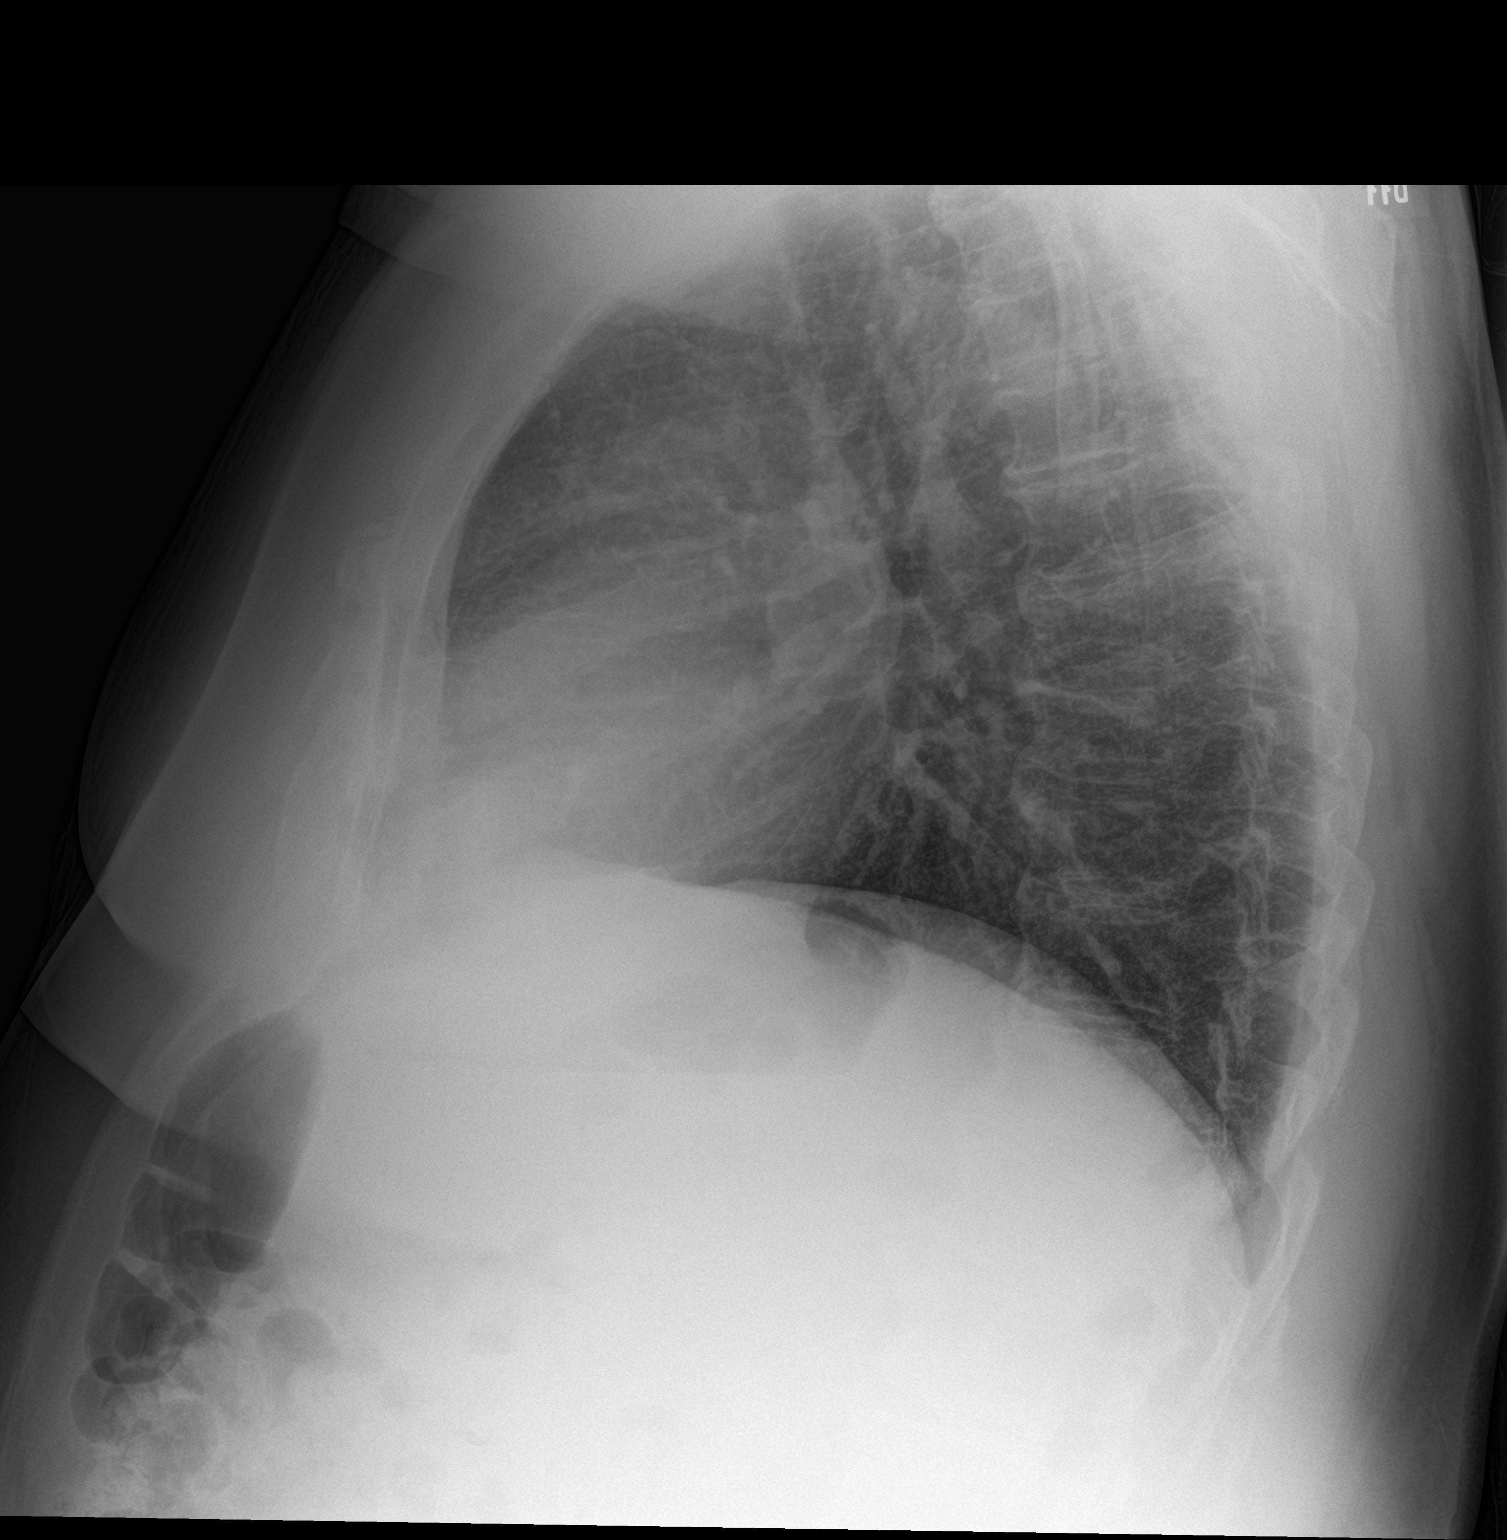

[2 of 2 positions shown; findings below may reference images not displayed]

FINDINGS: Stable cardiac and mediastinal contours. No large area pulmonary
consolidation. No pleural effusion or pneumothorax. Thoracic spine
degenerative changes.
IMPRESSION: No active cardiopulmonary disease.

## 2023-01-26 DIAGNOSIS — H401132 Primary open-angle glaucoma, bilateral, moderate stage: Secondary | ICD-10-CM | POA: Diagnosis not present

## 2023-01-27 DIAGNOSIS — G4730 Sleep apnea, unspecified: Secondary | ICD-10-CM | POA: Diagnosis not present

## 2023-01-27 DIAGNOSIS — C672 Malignant neoplasm of lateral wall of bladder: Secondary | ICD-10-CM | POA: Diagnosis not present

## 2023-01-27 DIAGNOSIS — I10 Essential (primary) hypertension: Secondary | ICD-10-CM | POA: Diagnosis not present

## 2023-01-27 DIAGNOSIS — Z6841 Body Mass Index (BMI) 40.0 and over, adult: Secondary | ICD-10-CM | POA: Diagnosis not present

## 2023-01-27 DIAGNOSIS — M159 Polyosteoarthritis, unspecified: Secondary | ICD-10-CM | POA: Diagnosis not present

## 2023-01-27 DIAGNOSIS — E6609 Other obesity due to excess calories: Secondary | ICD-10-CM | POA: Diagnosis not present

## 2023-01-27 DIAGNOSIS — E119 Type 2 diabetes mellitus without complications: Secondary | ICD-10-CM | POA: Diagnosis not present

## 2023-01-27 DIAGNOSIS — I700 Atherosclerosis of aorta: Secondary | ICD-10-CM | POA: Diagnosis not present

## 2023-04-06 DIAGNOSIS — B351 Tinea unguium: Secondary | ICD-10-CM | POA: Diagnosis not present

## 2023-04-06 DIAGNOSIS — G4730 Sleep apnea, unspecified: Secondary | ICD-10-CM | POA: Diagnosis not present

## 2023-04-06 DIAGNOSIS — M159 Polyosteoarthritis, unspecified: Secondary | ICD-10-CM | POA: Diagnosis not present

## 2023-04-06 DIAGNOSIS — E6609 Other obesity due to excess calories: Secondary | ICD-10-CM | POA: Diagnosis not present

## 2023-04-06 DIAGNOSIS — D518 Other vitamin B12 deficiency anemias: Secondary | ICD-10-CM | POA: Diagnosis not present

## 2023-04-06 DIAGNOSIS — G9332 Myalgic encephalomyelitis/chronic fatigue syndrome: Secondary | ICD-10-CM | POA: Diagnosis not present

## 2023-04-06 DIAGNOSIS — C672 Malignant neoplasm of lateral wall of bladder: Secondary | ICD-10-CM | POA: Diagnosis not present

## 2023-04-06 DIAGNOSIS — Z0001 Encounter for general adult medical examination with abnormal findings: Secondary | ICD-10-CM | POA: Diagnosis not present

## 2023-04-06 DIAGNOSIS — E785 Hyperlipidemia, unspecified: Secondary | ICD-10-CM | POA: Diagnosis not present

## 2023-04-06 DIAGNOSIS — Z1331 Encounter for screening for depression: Secondary | ICD-10-CM | POA: Diagnosis not present

## 2023-04-06 DIAGNOSIS — I10 Essential (primary) hypertension: Secondary | ICD-10-CM | POA: Diagnosis not present

## 2023-04-06 DIAGNOSIS — Z6838 Body mass index (BMI) 38.0-38.9, adult: Secondary | ICD-10-CM | POA: Diagnosis not present

## 2023-04-06 DIAGNOSIS — E559 Vitamin D deficiency, unspecified: Secondary | ICD-10-CM | POA: Diagnosis not present

## 2023-04-06 DIAGNOSIS — Z125 Encounter for screening for malignant neoplasm of prostate: Secondary | ICD-10-CM | POA: Diagnosis not present

## 2023-07-27 DIAGNOSIS — H26493 Other secondary cataract, bilateral: Secondary | ICD-10-CM | POA: Diagnosis not present

## 2023-07-27 DIAGNOSIS — H401132 Primary open-angle glaucoma, bilateral, moderate stage: Secondary | ICD-10-CM | POA: Diagnosis not present

## 2023-07-27 DIAGNOSIS — Z961 Presence of intraocular lens: Secondary | ICD-10-CM | POA: Diagnosis not present

## 2023-09-06 ENCOUNTER — Telehealth: Payer: Self-pay | Admitting: Urology

## 2023-09-06 NOTE — Telephone Encounter (Signed)
Patient left message with answering service about scheduling appointment.

## 2023-09-06 NOTE — Telephone Encounter (Signed)
Left message on voicemail for patient to call back and set up appointment

## 2023-09-20 ENCOUNTER — Ambulatory Visit (INDEPENDENT_AMBULATORY_CARE_PROVIDER_SITE_OTHER): Payer: Medicare Other | Admitting: Urology

## 2023-09-20 ENCOUNTER — Other Ambulatory Visit: Payer: Medicare Other | Admitting: Urology

## 2023-09-20 VITALS — BP 118/78 | HR 77

## 2023-09-20 DIAGNOSIS — C672 Malignant neoplasm of lateral wall of bladder: Secondary | ICD-10-CM | POA: Diagnosis not present

## 2023-09-20 DIAGNOSIS — Z8551 Personal history of malignant neoplasm of bladder: Secondary | ICD-10-CM

## 2023-09-20 DIAGNOSIS — R3914 Feeling of incomplete bladder emptying: Secondary | ICD-10-CM

## 2023-09-20 DIAGNOSIS — N138 Other obstructive and reflux uropathy: Secondary | ICD-10-CM

## 2023-09-20 DIAGNOSIS — N401 Enlarged prostate with lower urinary tract symptoms: Secondary | ICD-10-CM

## 2023-09-20 MED ORDER — CIPROFLOXACIN HCL 500 MG PO TABS
500.0000 mg | ORAL_TABLET | Freq: Once | ORAL | Status: AC
  Administered 2023-09-20: 500 mg via ORAL

## 2023-09-20 NOTE — Progress Notes (Unsigned)
   09/20/23  CC: No chief complaint on file.   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  He presented with gross hematuria. F/u TUR 12/22 benign.    Biopsy/TUR: August 2022-high-grade T1 right lateral and left up toward dome. 2 cm.  Gemcitabine instilled. Dec 2022 - TUR (benign no residual disease), bilat rgp (normal), DRE nl    Staging/imaging: June 2022 CT scan abdomen and pelvis-benign.  No bone lesions or lymphadenopathy. Dec 2022 bilateral RGP - normal    BCG: Feb 2023 - Induction x 19 Jul 2022 - maintenance       Today, seen for the above. He is well. No dysuria or gross hematuria. Overdue for f/u. Missed his BCG maintenance. Had a knee replacement. Sometimes he doesn't feel like he empties his bladder and needs to double void.   UA clear.   Blood pressure 118/78, pulse 77. NED. A&Ox3.   No respiratory distress   Abd soft, NT, ND Normal phallus with bilateral descended testicles  Cystoscopy Procedure Note  Patient identification was confirmed, informed consent was obtained, and patient was prepped using Betadine solution.  Lidocaine jelly was administered per urethral meatus.     Pre-Procedure: - Inspection reveals a normal caliber ureteral meatus.  Procedure: The flexible cystoscope was introduced without difficulty - No urethral strictures/lesions are present. - moderal BPH prostate with BOO  - normal bladder neck - Bilateral ureteral orifices identified - Bladder mucosa  reveals no ulcers, tumors, or lesions - No bladder stones - No trabeculation  Retroflexion shows normal bladder and bladder neck    Post-Procedure: - Patient tolerated the procedure well  Assessment/ Plan:  Bph - disc nature r/b/a to tamsulosin. He will do some research and consider.   H/o bladder cancer - cysto looks good todau   No follow-ups on file.  Jerilee Field, MD

## 2023-09-21 LAB — URINALYSIS, ROUTINE W REFLEX MICROSCOPIC
Bilirubin, UA: NEGATIVE
Glucose, UA: NEGATIVE
Ketones, UA: NEGATIVE
Leukocytes,UA: NEGATIVE
Nitrite, UA: NEGATIVE
Protein,UA: NEGATIVE
RBC, UA: NEGATIVE
Specific Gravity, UA: 1.025 (ref 1.005–1.030)
Urobilinogen, Ur: 0.2 mg/dL (ref 0.2–1.0)
pH, UA: 6 (ref 5.0–7.5)

## 2023-09-22 ENCOUNTER — Telehealth: Payer: Self-pay

## 2023-09-22 NOTE — Telephone Encounter (Signed)
Patient is needing a prescription of Tamsulosin. This was not sent to pharmacy day of appointment. Requesting 90 day supply.

## 2023-09-29 ENCOUNTER — Other Ambulatory Visit: Payer: Self-pay | Admitting: Urology

## 2023-09-29 MED ORDER — TAMSULOSIN HCL 0.4 MG PO CAPS: 0.4000 mg | ORAL_CAPSULE | Freq: Every day | ORAL | 3 refills | Status: DC

## 2023-09-29 NOTE — Telephone Encounter (Signed)
Patient called again he has been waiting for a week for refill of Flomax to be sent to [facility] pharmacy

## 2024-01-31 DIAGNOSIS — I10 Essential (primary) hypertension: Secondary | ICD-10-CM | POA: Diagnosis not present

## 2024-01-31 DIAGNOSIS — C672 Malignant neoplasm of lateral wall of bladder: Secondary | ICD-10-CM | POA: Diagnosis not present

## 2024-01-31 DIAGNOSIS — I700 Atherosclerosis of aorta: Secondary | ICD-10-CM | POA: Diagnosis not present

## 2024-01-31 DIAGNOSIS — Z6839 Body mass index (BMI) 39.0-39.9, adult: Secondary | ICD-10-CM | POA: Diagnosis not present

## 2024-01-31 DIAGNOSIS — M159 Polyosteoarthritis, unspecified: Secondary | ICD-10-CM | POA: Diagnosis not present

## 2024-01-31 DIAGNOSIS — R7303 Prediabetes: Secondary | ICD-10-CM | POA: Diagnosis not present

## 2024-01-31 DIAGNOSIS — E6609 Other obesity due to excess calories: Secondary | ICD-10-CM | POA: Diagnosis not present

## 2024-01-31 DIAGNOSIS — G4730 Sleep apnea, unspecified: Secondary | ICD-10-CM | POA: Diagnosis not present

## 2024-02-01 DIAGNOSIS — H401132 Primary open-angle glaucoma, bilateral, moderate stage: Secondary | ICD-10-CM | POA: Diagnosis not present

## 2024-03-20 ENCOUNTER — Ambulatory Visit: Payer: Medicare Other | Admitting: Urology

## 2024-03-20 VITALS — BP 129/73 | HR 86

## 2024-03-20 DIAGNOSIS — Z8551 Personal history of malignant neoplasm of bladder: Secondary | ICD-10-CM

## 2024-03-20 NOTE — Progress Notes (Unsigned)
   03/20/24  CC: No chief complaint on file.   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  He presented with gross hematuria. F/u TUR 12/22 benign. Missed his BCG maintenance in 2023 and f/u 1 year later in 2024. Had a knee replacement.    Biopsy/TUR: August 2022-high-grade T1 right lateral and left up toward dome. 2 cm.  Gemcitabine instilled. Dec 2022 - TUR (benign no residual disease), bilat rgp (normal), DRE nl    Staging/imaging: June 2022 CT scan abdomen and pelvis-benign.  No bone lesions or lymphadenopathy. Dec 2022 bilateral RGP - normal    BCG: Feb 2023 - Induction x 19 Jul 2022 - maintenance       Today, seen for the above. He is well. No dysuria or gross hematuria. Sometimes he doesn't feel like he empties his bladder and needs to double void. He is on tamsulosin and may have started finasteride or dutasteride (couldn't recall if it was 5 mg or 0.5 mg).    UA clear.   Blood pressure 129/73, pulse 86. NED. A&Ox3.   No respiratory distress   Abd soft, NT, ND Normal phallus with bilateral descended testicles  Cystoscopy Procedure Note  Patient identification was confirmed, informed consent was obtained, and patient was prepped using Betadine solution.  Lidocaine jelly was administered per urethral meatus.     Pre-Procedure: - Inspection reveals a normal caliber ureteral meatus.  Procedure: The flexible cystoscope was introduced without difficulty - No urethral strictures/lesions are present. - BOO moderate prostate, no median lobe - normal bladder neck - Bilateral ureteral orifices identified - Bladder mucosa  reveals no ulcers, tumors, or lesions - No bladder stones - No trabeculation  Retroflexion shows normal bladder and bladder neck    Post-Procedure: - Patient tolerated the procedure well  Assessment/ Plan:  H/o bladder cancer - f/u 6 mo for cystoscopy   No follow-ups on file.  Jerilee Field,  MD

## 2024-04-18 ENCOUNTER — Encounter: Payer: Self-pay | Admitting: Internal Medicine

## 2024-04-18 ENCOUNTER — Ambulatory Visit: Admitting: Internal Medicine

## 2024-04-18 VITALS — BP 121/74 | HR 69 | Temp 97.6°F | Ht 71.0 in | Wt 280.2 lb

## 2024-04-18 DIAGNOSIS — K5909 Other constipation: Secondary | ICD-10-CM | POA: Diagnosis not present

## 2024-04-18 DIAGNOSIS — Z860101 Personal history of adenomatous and serrated colon polyps: Secondary | ICD-10-CM

## 2024-04-18 DIAGNOSIS — Z800 Family history of malignant neoplasm of digestive organs: Secondary | ICD-10-CM | POA: Diagnosis not present

## 2024-04-18 DIAGNOSIS — Z860100 Personal history of colon polyps, unspecified: Secondary | ICD-10-CM

## 2024-04-18 NOTE — Progress Notes (Signed)
 Primary Care Physician:  Kathyleen Parkins, MD Primary Gastroenterologist:  Dr. Riley Cheadle  Pre-Procedure History & Physical: HPI:  Timothy Mcdaniel is a 78 y.o. male here  to set up a surveillance colonoscopy.  He had 1 small adenoma removed 10 years ago.  He has a positive family history of colon cancer in his father.  Although he has comorbidities, he remains in fairly good health and is desirous of 1 more colonoscopy.  Aside from chronic constipation having a bowel movement daily to every other day he is devoid of any lower GI tract symptoms.  Past Medical History:  Diagnosis Date   Arthritis    OA- both knees , L shoulder    Exogenous obesity    Hypercholesteremia    Hypertension    Nonischemic cardiomyopathy (HCC)    OA (osteoarthritis)    OSA on CPAP    Recurrent upper respiratory infection (URI)    DEC. had flu shot- followed by bad cold, treated /w OTC   Shortness of breath    Stroke (HCC)    ministroke    Past Surgical History:  Procedure Laterality Date   COLONOSCOPY N/A 02/09/2014   Procedure: COLONOSCOPY;  Surgeon: Suzette Espy, MD;  Location: [facility];  Service: Endoscopy;  Laterality: N/A;  11:30 AM   EYE SURGERY     cataracts   MANDIBLE FRACTURE SURGERY     1960's - occupational injury   TOTAL KNEE ARTHROPLASTY  02/15/2012   Procedure: TOTAL KNEE ARTHROPLASTY;  Surgeon: Florencia Hunter, MD;  Location: [facility] OR;  Service: Orthopedics;  Laterality: Left;   TOTAL KNEE ARTHROPLASTY Right 11/23/2022   Procedure: TOTAL KNEE ARTHROPLASTY;  Surgeon: Liliane Rei, MD;  Location: [facility];  Service: Orthopedics;  Laterality: Right;   TRANSTHORACIC ECHOCARDIOGRAM  08/03/2011   EF 50%; LV systolic funtion mildly reduced; trace MR, mild TR   TRANSURETHRAL RESECTION OF BLADDER TUMOR N/A 08/12/2021   Procedure: TRANSURETHRAL RESECTION OF BLADDER TUMOR (TURBT) WITH POST OPERATIVE INSTILLATION OF GEMCITABINE ;  Surgeon: Christina Coyer, MD;  Location: [facility];  Service:  Urology;  Laterality: N/A;   TRANSURETHRAL RESECTION OF BLADDER TUMOR N/A 11/14/2021   Procedure: TRANSURETHRAL RESECTION OF SMALL BLADDER TUMOR (TURBT) BILATERAL RETROGRADE/POST OPERATIVE INSTILLATION OF GEMCITABINE ;  Surgeon: Christina Coyer, MD;  Location: [facility];  Service: Urology;  Laterality: N/A;    Prior to Admission medications   Medication Sig Start Date End Date Taking? Authorizing Provider  aspirin EC 81 MG tablet Take 81 mg by mouth daily. Swallow whole.   Yes [provider]  CALCIUM MAGNESIUM ZINC PO Take 1 capsule by mouth daily.   Yes [provider]  Cholecalciferol (VITAMIN D-3) 125 MCG (5000 UT) TABS Take 125 mcg by mouth 3 (three) times a week.   Yes [provider]  Coenzyme Q10 (COQ10) 200 MG CAPS Take 200 mg by mouth daily.   Yes [provider]  dorzolamide -timolol  (COSOPT ) 22.3-6.8 MG/ML ophthalmic solution Place 1 drop into the right eye at bedtime.   Yes [provider]  dutasteride (AVODART) 0.5 MG capsule Take 0.5 mg by mouth daily. 01/31/24  Yes [provider]  fenofibrate  (TRICOR ) 48 MG tablet TAKE ONE TABLET BY MOUTH ONCE DAILY 10/31/14  Yes Hilty, Aviva Lemmings, MD  hydrochlorothiazide  (HYDRODIURIL ) 25 MG tablet Take 25 mg by mouth daily.   Yes [provider]  Oksana Bergamo Oil 500 MG CAPS Take 500 mg by mouth daily.   Yes [provider]  lisinopril  (  PRINIVIL ,ZESTRIL ) 40 MG tablet Take 40 mg by mouth daily.   Yes [provider]  Multiple Vitamin (MULTIVITAMIN) tablet Take 1 tablet by mouth daily.   Yes [provider]  traMADol  (ULTRAM ) 50 MG tablet Take 50 mg by mouth at bedtime.   Yes [provider]  traZODone  (DESYREL ) 100 MG tablet Take 100 mg by mouth at bedtime.   Yes [provider]  Turmeric (QC TUMERIC COMPLEX PO) Take 1,500 mg by mouth daily.   Yes [provider]    Allergies as of 04/18/2024   (No Known Allergies)    Family History   Problem Relation Age of Onset   Heart attack Mother    Cancer Father    Kidney disease Brother    Anesthesia problems Neg Hx    Hypotension Neg Hx    Malignant hyperthermia Neg Hx    Pseudochol deficiency Neg Hx    Colon cancer Neg Hx     Social History   Socioeconomic History   Marital status: Married    Spouse name: Kham Mimms   Number of children: 2   Years of education: 12   Highest education level: 12th grade  Occupational History   Not on file  Tobacco Use   Smoking status: Former    Current packs/day: 0.00    Average packs/day: 0.5 packs/day for 30.0 years (15.0 ttl pk-yrs)    Types: Cigarettes    Start date: 03/29/1964    Quit date: 03/29/1994    Years since quitting: 30.0    Passive exposure: Past   Smokeless tobacco: Never  Vaping Use   Vaping status: Never Used  Substance and Sexual Activity   Alcohol use: Yes    Comment: rare    one time a month   Drug use: No   Sexual activity: Not Currently    Partners: Female  Other Topics Concern   Not on file  Social History Narrative   Not on file   Social Drivers of Health   Financial Resource Strain: Low Risk  (11/27/2022)   Overall Financial Resource Strain (CARDIA)    Difficulty of Paying Living Expenses: Not hard at all  Food Insecurity: No Food Insecurity (11/27/2022)   Hunger Vital Sign    Worried About Running Out of Food in the Last Year: Never true    Ran Out of Food in the Last Year: Never true  Transportation Needs: No Transportation Needs (11/27/2022)   PRAPARE - Administrator, Civil Service (Medical): No    Lack of Transportation (Non-Medical): No  Physical Activity: Sufficiently Active (11/27/2022)   Exercise Vital Sign    Days of Exercise per Week: 5 days    Minutes of Exercise per Session: 30 min  Stress: No Stress Concern Present (11/27/2022)   [employer] of Occupational Health - Occupational Stress Questionnaire    Feeling of Stress : Not at all  Social  Connections: Socially Integrated (11/27/2022)   Social Connection and Isolation Panel [NHANES]    Frequency of Communication with Friends and Family: More than three times a week    Frequency of Social Gatherings with Friends and Family: More than three times a week    Attends Religious Services: More than 4 times per year    Active Member of [employer] or Organizations: Yes    Attends Banker Meetings: More than 4 times per year    Marital Status: Married  Catering manager Violence: Not At Risk (11/27/2022)  Humiliation, Afraid, Rape, and Kick questionnaire    Fear of Current or Ex-Partner: No    Emotionally Abused: No    Physically Abused: No    Sexually Abused: No    Review of Systems: See HPI, otherwise negative ROS  Physical Exam: BP 121/74 (BP Location: Right Arm, Patient Position: Sitting, Cuff Size: Large)   Pulse 69   Temp 97.6 F (36.4 C) (Oral)   Ht 5\' 11"  (1.803 m)   Wt 280 lb 3.2 oz (127.1 kg)   SpO2 97%   BMI 39.08 kg/m  General:   Alert,  Well-developed, well-nourished, pleasant and cooperative in NAD Lungs:  Clear throughout to auscultation.   No wheezes, crackles, or rhonchi. No acute distress. Heart:  Regular rate and rhythm; no murmurs, clicks, rubs,  or gallops. Abdomen: Obese, normal bowel sounds.  Soft and nontender without appreciable mass or hepatosplenomegaly.   Impression/Plan: 78 year old mildly obese gentleman with a history of colonic adenoma and a positive family history colon cancer in a first-degree relative who comes desirous of 1 more follow-up colonoscopy.  This is not unreasonable.  Although he has comorbidities,  he remains in fairly good health and has a good quality of life.  Mild chronic constipation  -  on no consistent bowel regimen.  Recommendations:  We will schedule a surveillance colonoscopy (history of colon polyp and family history of colon cancer).  ASA 3.  The risks, benefits, limitations, alternatives and imponderables  have been reviewed with the patient. Questions have been answered. All parties are agreeable.    I recommend  a stool softener/Colace (docusate) -one 100 mg pill twice daily to soften your stool.  Further recommendations to follow after colonoscopy has been performed.   Notice: This dictation was prepared with Dragon dictation along with smaller phrase technology. Any transcriptional errors that result from this process are unintentional and may not be corrected upon review.

## 2024-04-18 NOTE — Patient Instructions (Signed)
 It was good to see you again today!  As discussed we will schedule a surveillance colonoscopy (history of colon polyp and family history of colon cancer).  ASA 3.  I recommend you try a stool softener with Colace (docusate) -one 100 mg pill twice daily to soften your stool.  Further recommendations to follow after your colonoscopy has been performed.

## 2024-04-19 ENCOUNTER — Telehealth: Payer: Self-pay | Admitting: *Deleted

## 2024-04-19 NOTE — Telephone Encounter (Signed)
 Called pt to schedule TCS with Dr. Riley Cheadle, ASA . He is going to check with his spouse if he needs to schedule June or July and let us  know.

## 2024-04-20 ENCOUNTER — Encounter: Payer: Self-pay | Admitting: *Deleted

## 2024-04-20 ENCOUNTER — Other Ambulatory Visit: Payer: Self-pay | Admitting: *Deleted

## 2024-04-20 MED ORDER — PEG 3350-KCL-NA BICARB-NACL 420 G PO SOLR: 4000.0000 mL | Freq: Once | ORAL | 0 refills | Status: AC

## 2024-04-20 NOTE — Telephone Encounter (Signed)
 Pt has been scheduled for 06/05/24. Instructions mailed and prep sent to the pharmacy.

## 2024-05-30 NOTE — Patient Instructions (Signed)
 Timothy Mcdaniel  05/30/2024     @PREFPERIOPPHARMACY @   Your procedure is scheduled on 06/05/2024.   Report to [facility] at  1100 A.M.   Call this number if you have problems the morning of surgery:  [phone]  If you experience any cold or flu symptoms such as cough, fever, chills, shortness of breath, etc. between now and your scheduled surgery, please notify us  at the above number.   Remember:  Follow the diet and prep instructions given to you by the office.   You may drink clear liquids until 0900 am on 06/05/2024.    Clear liquids allowed are:                    Water , Juice (No red color; non-citric and without pulp; diabetics please choose diet or no sugar options), Carbonated beverages (diabetics please choose diet or no sugar options), Clear Tea (No creamer, milk, or cream, including half & half and powdered creamer), Black Coffee Only (No creamer, milk or cream, including half & half and powdered creamer), and Clear Sports drink (No red color; diabetics please choose diet or no sugar options)    Take these medicines the morning of surgery with A SIP OF WATER                                                    None.    Do not wear jewelry, make-up or nail polish, including gel polish,  artificial nails, or any other type of covering on natural nails (fingers and  toes).  Do not wear lotions, powders, or perfumes, or deodorant.  Do not shave 48 hours prior to surgery.  Men may shave face and neck.  Do not bring valuables to the hospital.  [facility] is not responsible for any belongings or valuables.  Contacts, dentures or bridgework may not be worn into surgery.  Leave your suitcase in the car.  After surgery it may be brought to your room.  For patients admitted to the hospital, discharge time will be determined by your treatment team.  Patients discharged the day of surgery will not be allowed to drive home and must have someone with them for 24 hours.     Special instructions:   DO NOT smoke tobacco or vape for 24 hours before your procedure.  Please read over the following fact sheets that you were given. Anesthesia Post-op Instructions and Care and Recovery After Surgery      Colonoscopy, Adult, Care After The following information offers guidance on how to care for yourself after your procedure. Your health care provider may also give you more specific instructions. If you have problems or questions, contact your health care provider. What can I expect after the procedure? After the procedure, it is common to have: A small amount of blood in your stool for 24 hours after the procedure. Some gas. Mild cramping or bloating of your abdomen. Follow these instructions at home: Eating and drinking  Drink enough fluid to keep your urine pale yellow. Follow instructions from your health care provider about eating or drinking restrictions. Resume your normal diet as told by your health care provider. Avoid heavy or fried foods that are hard to digest. Activity Rest as told by your health care provider. Avoid sitting for a  long time without moving. Get up to take short walks every 1-2 hours. This is important to improve blood flow and breathing. Ask for help if you feel weak or unsteady. Return to your normal activities as told by your health care provider. Ask your health care provider what activities are safe for you. Managing cramping and bloating  Try walking around when you have cramps or feel bloated. If directed, apply heat to your abdomen as told by your health care provider. Use the heat source that your health care provider recommends, such as a moist heat pack or a heating pad. Place a towel between your skin and the heat source. Leave the heat on for 20-30 minutes. Remove the heat if your skin turns bright red. This is especially important if you are unable to feel pain, heat, or cold. You have a greater risk of getting  burned. General instructions If you were given a sedative during the procedure, it can affect you for several hours. Do not drive or operate machinery until your health care provider says that it is safe. For the first 24 hours after the procedure: Do not sign important documents. Do not drink alcohol. Do your regular daily activities at a slower pace than normal. Eat soft foods that are easy to digest. Take over-the-counter and prescription medicines only as told by your health care provider. Keep all follow-up visits. This is important. Contact a health care provider if: You have blood in your stool 2-3 days after the procedure. Get help right away if: You have more than a small spotting of blood in your stool. You have large blood clots in your stool. You have swelling of your abdomen. You have nausea or vomiting. You have a fever. You have increasing pain in your abdomen that is not relieved with medicine. These symptoms may be an emergency. Get help right away. Call 911. Do not wait to see if the symptoms will go away. Do not drive yourself to the hospital. Summary After the procedure, it is common to have a small amount of blood in your stool. You may also have mild cramping and bloating of your abdomen. If you were given a sedative during the procedure, it can affect you for several hours. Do not drive or operate machinery until your health care provider says that it is safe. Get help right away if you have a lot of blood in your stool, nausea or vomiting, a fever, or increased pain in your abdomen. This information is not intended to replace advice given to you by your health care provider. Make sure you discuss any questions you have with your health care provider. Document Revised: 01/12/2023 Document Reviewed: 07/23/2021 Elsevier Patient Education  2024 [employer]General Anesthesia, Adult, Care After The following information offers guidance on how to care for yourself  after your procedure. Your health care provider may also give you more specific instructions. If you have problems or questions, contact your health care provider. What can I expect after the procedure? After the procedure, it is common for people to: Have pain or discomfort at the IV site. Have nausea or vomiting. Have a sore throat or hoarseness. Have trouble concentrating. Feel cold or chills. Feel weak, sleepy, or tired (fatigue). Have soreness and body aches. These can affect parts of the body that were not involved in surgery. Follow these instructions at home: For the time period you were told by your health care provider:  Rest. Do not participate in activities where  you could fall or become injured. Do not drive or use machinery. Do not drink alcohol. Do not take sleeping pills or medicines that cause drowsiness. Do not make important decisions or sign legal documents. Do not take care of children on your own. General instructions Drink enough fluid to keep your urine pale yellow. If you have sleep apnea, surgery and certain medicines can increase your risk for breathing problems. Follow instructions from your health care provider about wearing your sleep device: Anytime you are sleeping, including during daytime naps. While taking prescription pain medicines, sleeping medicines, or medicines that make you drowsy. Return to your normal activities as told by your health care provider. Ask your health care provider what activities are safe for you. Take over-the-counter and prescription medicines only as told by your health care provider. Do not use any products that contain nicotine or tobacco. These products include cigarettes, chewing tobacco, and vaping devices, such as e-cigarettes. These can delay incision healing after surgery. If you need help quitting, ask your health care provider. Contact a health care provider if: You have nausea or vomiting that does not get better  with medicine. You vomit every time you eat or drink. You have pain that does not get better with medicine. You cannot urinate or have bloody urine. You develop a skin rash. You have a fever. Get help right away if: You have trouble breathing. You have chest pain. You vomit blood. These symptoms may be an emergency. Get help right away. Call 911. Do not wait to see if the symptoms will go away. Do not drive yourself to the hospital. Summary After the procedure, it is common to have a sore throat, hoarseness, nausea, vomiting, or to feel weak, sleepy, or fatigue. For the time period you were told by your health care provider, do not drive or use machinery. Get help right away if you have difficulty breathing, have chest pain, or vomit blood. These symptoms may be an emergency. This information is not intended to replace advice given to you by your health care provider. Make sure you discuss any questions you have with your health care provider. Document Revised: 02/27/2022 Document Reviewed: 02/27/2022 Elsevier Patient Education  2024 [employer].

## 2024-05-31 ENCOUNTER — Encounter (HOSPITAL_COMMUNITY)
Admission: RE | Admit: 2024-05-31 | Discharge: 2024-05-31 | Disposition: A | Discharge status: Home / Self Care | Source: Ambulatory Visit | Attending: Internal Medicine | Admitting: Internal Medicine

## 2024-05-31 VITALS — BP 134/74 | HR 84 | Resp 18 | Ht 71.0 in | Wt 280.2 lb

## 2024-05-31 DIAGNOSIS — Z01818 Encounter for other preprocedural examination: Secondary | ICD-10-CM | POA: Insufficient documentation

## 2024-05-31 DIAGNOSIS — Z79899 Other long term (current) drug therapy: Secondary | ICD-10-CM | POA: Diagnosis not present

## 2024-05-31 DIAGNOSIS — I10 Essential (primary) hypertension: Secondary | ICD-10-CM | POA: Insufficient documentation

## 2024-05-31 LAB — BASIC METABOLIC PANEL WITH GFR
Anion gap: 10 (ref 5–15)
BUN: 25 mg/dL — ABNORMAL HIGH (ref 8–23)
CO2: 22 mmol/L (ref 22–32)
Calcium: 9.6 mg/dL (ref 8.9–10.3)
Chloride: 104 mmol/L (ref 98–111)
Creatinine, Ser: 1.05 mg/dL (ref 0.61–1.24)
GFR, Estimated: >60 mL/min (ref >60)
Glucose, Bld: 119 mg/dL — ABNORMAL HIGH (ref 70–99)
Potassium: 3.8 mmol/L (ref 3.5–5.1)
Sodium: 136 mmol/L (ref 135–145)

## 2024-06-05 ENCOUNTER — Encounter (HOSPITAL_COMMUNITY)
Admission: RE | Disposition: A | Discharge status: Home / Self Care | Payer: Self-pay | Source: Home / Self Care | Attending: Internal Medicine

## 2024-06-05 ENCOUNTER — Ambulatory Visit (HOSPITAL_BASED_OUTPATIENT_CLINIC_OR_DEPARTMENT_OTHER): Payer: Self-pay | Admitting: Anesthesiology

## 2024-06-05 ENCOUNTER — Ambulatory Visit (HOSPITAL_COMMUNITY)
Admission: RE | Admit: 2024-06-05 | Discharge: 2024-06-05 | Disposition: A | Discharge status: Home / Self Care | Attending: Internal Medicine | Admitting: Internal Medicine

## 2024-06-05 ENCOUNTER — Encounter (HOSPITAL_COMMUNITY): Payer: Self-pay | Admitting: Internal Medicine

## 2024-06-05 ENCOUNTER — Ambulatory Visit (HOSPITAL_COMMUNITY): Payer: Self-pay | Admitting: Anesthesiology

## 2024-06-05 DIAGNOSIS — Q438 Other specified congenital malformations of intestine: Secondary | ICD-10-CM

## 2024-06-05 DIAGNOSIS — G4733 Obstructive sleep apnea (adult) (pediatric): Secondary | ICD-10-CM | POA: Insufficient documentation

## 2024-06-05 DIAGNOSIS — Z8673 Personal history of transient ischemic attack (TIA), and cerebral infarction without residual deficits: Secondary | ICD-10-CM | POA: Diagnosis not present

## 2024-06-05 DIAGNOSIS — Z1211 Encounter for screening for malignant neoplasm of colon: Secondary | ICD-10-CM | POA: Diagnosis not present

## 2024-06-05 DIAGNOSIS — Z87891 Personal history of nicotine dependence: Secondary | ICD-10-CM | POA: Diagnosis not present

## 2024-06-05 DIAGNOSIS — G4730 Sleep apnea, unspecified: Secondary | ICD-10-CM | POA: Diagnosis not present

## 2024-06-05 DIAGNOSIS — D123 Benign neoplasm of transverse colon: Secondary | ICD-10-CM | POA: Insufficient documentation

## 2024-06-05 DIAGNOSIS — Z800 Family history of malignant neoplasm of digestive organs: Secondary | ICD-10-CM | POA: Insufficient documentation

## 2024-06-05 DIAGNOSIS — K5909 Other constipation: Secondary | ICD-10-CM | POA: Insufficient documentation

## 2024-06-05 DIAGNOSIS — I10 Essential (primary) hypertension: Secondary | ICD-10-CM | POA: Diagnosis not present

## 2024-06-05 DIAGNOSIS — K635 Polyp of colon: Secondary | ICD-10-CM | POA: Diagnosis not present

## 2024-06-05 HISTORY — PX: COLONOSCOPY: SHX5424

## 2024-06-05 SURGERY — COLONOSCOPY
Anesthesia: General

## 2024-06-05 MED ORDER — LACTATED RINGERS IV SOLN: INTRAVENOUS | Status: DC

## 2024-06-05 MED ORDER — PROPOFOL 500 MG/50ML IV EMUL
INTRAVENOUS | Status: DC | PRN
  Administered 2024-06-05: 125 ug/kg/min via INTRAVENOUS

## 2024-06-05 MED ORDER — PROPOFOL 10 MG/ML IV BOLUS
INTRAVENOUS | Status: DC | PRN
  Administered 2024-06-05: 30 mg via INTRAVENOUS
  Administered 2024-06-05: 50 mg via INTRAVENOUS

## 2024-06-05 MED ORDER — LACTATED RINGERS IV SOLN: INTRAVENOUS | Status: DC | PRN

## 2024-06-05 MED ORDER — STERILE WATER FOR IRRIGATION IR SOLN
Status: DC | PRN
  Administered 2024-06-05: 60 mL

## 2024-06-05 NOTE — Transfer of Care (Signed)
 Immediate Anesthesia Transfer of Care Note  Patient: Timothy Mcdaniel  Procedure(s) Performed: COLONOSCOPY  Patient Location: PACU  Anesthesia Type:MAC  Level of Consciousness: awake and alert   Airway & Oxygen Therapy: Patient Spontanous Breathing and Patient connected to nasal cannula oxygen  Post-op Assessment: Report given to RN and Post -op Vital signs reviewed and stable  Post vital signs: Reviewed and stable  Last Vitals:  Vitals Value Taken Time  BP 113/66 06/05/24 13:34  Temp 36.6 C 06/05/24 13:34  Pulse 66 06/05/24 13:34  Resp 16 06/05/24 13:34  SpO2 100 % 06/05/24 13:34    Last Pain:  Vitals:   06/05/24 1334  TempSrc: Axillary  PainSc:          Complications: No notable events documented.

## 2024-06-05 NOTE — Anesthesia Preprocedure Evaluation (Addendum)
 Anesthesia Evaluation  Patient identified by MRN, date of birth, ID band Patient awake    Reviewed: Allergy & Precautions, H&P , NPO status , Patient's Chart, lab work & pertinent test results, reviewed documented beta blocker date and time   Airway Mallampati: II  TM Distance: >3 FB Neck ROM: full    Dental no notable dental hx.    Pulmonary shortness of breath, sleep apnea , Recent URI , former smoker   Pulmonary exam normal breath sounds clear to auscultation       Cardiovascular Exercise Tolerance: Good hypertension, + DOE   Rhythm:regular Rate:Normal     Neuro/Psych TIACVA  negative psych ROS   GI/Hepatic negative GI ROS, Neg liver ROS,,,  Endo/Other  negative endocrine ROS    Renal/GU negative Renal ROS  negative genitourinary   Musculoskeletal   Abdominal   Peds  Hematology negative hematology ROS (+)   Anesthesia Other Findings   Reproductive/Obstetrics negative OB ROS                             Anesthesia Physical Anesthesia Plan  ASA: 3  Anesthesia Plan: General   Post-op Pain Management:    Induction:   PONV Risk Score and Plan: Propofol  infusion  Airway Management Planned:   Additional Equipment:   Intra-op Plan:   Post-operative Plan:   Informed Consent: I have reviewed the patients History and Physical, chart, labs and discussed the procedure including the risks, benefits and alternatives for the proposed anesthesia with the patient or authorized representative who has indicated his/her understanding and acceptance.     Dental Advisory Given  Plan Discussed with: CRNA  Anesthesia Plan Comments:        Anesthesia Quick Evaluation

## 2024-06-05 NOTE — Op Note (Signed)
 [facility] Patient Name: Timothy Mcdaniel Procedure Date: 06/05/2024 12:00 PM MRN: [id number] Date of Birth: 1946/03/31 Attending MD: Lamar Ozell Hollingshead , MD, [id number] CSN: [id number] Age: 78 Admit Type: Outpatient Procedure:                Colonoscopy Indications:              High risk colon cancer surveillance: Personal                            history of colonic polyps Providers:                Lamar Ozell Hollingshead, MD, [place]vere Lodge, [place],                            Technician, Bascom Blush Referring MD:              Medicines:                Propofol  per Anesthesia Complications:            No immediate complications. Estimated Blood Loss:     Estimated blood loss was minimal. Procedure:                Pre-Anesthesia Assessment:                           - Prior to the procedure, a History and Physical                            was performed, and patient medications and                            allergies were reviewed. The patient's tolerance of                            previous anesthesia was also reviewed. The risks                            and benefits of the procedure and the sedation                            options and risks were discussed with the patient.                            All questions were answered, and informed consent                            was obtained. Prior Anticoagulants: The patient has                            taken no anticoagulant or antiplatelet agents. ASA                            Grade Assessment: III - A patient with severe  systemic disease. After reviewing the risks and                            benefits, the patient was deemed in satisfactory                            condition to undergo the procedure.                           After obtaining informed consent, the colonoscope                            was passed under direct vision. Throughout the                             procedure, the patient's blood pressure, pulse, and                            oxygen saturations were monitored continuously. The                            [id number]) scope was introduced through the                            anus and advanced to the the cecum, identified by                            appendiceal orifice and ileocecal valve. The                            colonoscopy was performed without difficulty. The                            patient tolerated the procedure well. The quality                            of the bowel preparation was adequate. The entire                            colon was well visualized. The patient tolerated                            the procedure well. The quality of the bowel                            preparation was adequate. The ileocecal valve,                            appendiceal orifice, and rectum were photographed. Scope In: 1:04:25 PM Scope Out: 1:27:15 PM Scope Withdrawal Time: 0 hours 5 minutes 33 seconds  Total Procedure Duration: 0 hours 22 minutes 50 seconds  Findings:      The perianal and digital rectal examinations were normal. Elongated and       redundant colon requiring strategically  placed external abdominal       pressure to reach the cecum      Two sessile polyps were found in the hepatic flexure. The polyps were 3       to 4 mm in size. These polyps were removed with a cold snare. Resection       and retrieval were complete. Estimated blood loss was minimal.      The exam was otherwise without abnormality on direct and retroflexion       views. Impression:               - Two 3 to 4 mm polyps at the hepatic flexure,                            removed with a cold snare. Resected and retrieved.                            Elongated and redundant colon                           - The examination was otherwise normal on direct                            and retroflexion views. Moderate Sedation:      Moderate  (conscious) sedation was personally administered by an       anesthesia professional. The following parameters were monitored: oxygen       saturation, heart rate, blood pressure, respiratory rate, EKG, adequacy       of pulmonary ventilation, and response to care. Recommendation:           - Patient has a contact number available for                            emergencies. The signs and symptoms of potential                            delayed complications were discussed with the                            patient. Return to normal activities tomorrow.                            Written discharge instructions were provided to the                            patient.                           - Advance diet as tolerated.                           - Continue present medications.                           - Repeat colonoscopy date to be determined after  pending pathology results are reviewed for                            surveillance.                           - Return to GI office (date not yet determined). Procedure Code(s):        --- Professional ---                           [phone], Colonoscopy, flexible; with removal of                            tumor(s), polyp(s), or other lesion(s) by snare                            technique Diagnosis Code(s):        --- Professional ---                           Z86.010, Personal history of colonic polyps                           D12.3, Benign neoplasm of transverse colon (hepatic                            flexure or splenic flexure) CPT copyright 2022 American Medical Association. All rights reserved. The codes documented in this report are preliminary and upon coder review may  be revised to meet current compliance requirements. Lamar HERO. Kenroy Timberman, MD Lamar Ozell Hollingshead, MD 06/05/2024 1:43:52 PM This report has been signed electronically. Number of Addenda: 0

## 2024-06-05 NOTE — Discharge Instructions (Signed)
  Colonoscopy Discharge Instructions  Read the instructions outlined below and refer to this sheet in the next few weeks. These discharge instructions provide you with general information on caring for yourself after you leave the hospital. Your doctor may also give you specific instructions. While your treatment has been planned according to the most current medical practices available, unavoidable complications occasionally occur. If you have any problems or questions after discharge, call Dr. Shaaron at [phone]. ACTIVITY You may resume your regular activity, but move at a slower pace for the next 24 hours.  Take frequent rest periods for the next 24 hours.  Walking will help get rid of the air and reduce the bloated feeling in your belly (abdomen).  No driving for 24 hours (because of the medicine (anesthesia) used during the test).   Do not sign any important legal documents or operate any machinery for 24 hours (because of the anesthesia used during the test).  NUTRITION Drink plenty of fluids.  You may resume your normal diet as instructed by your doctor.  Begin with a light meal and progress to your normal diet. Heavy or fried foods are harder to digest and may make you feel sick to your stomach (nauseated).  Avoid alcoholic beverages for 24 hours or as instructed.  MEDICATIONS You may resume your normal medications unless your doctor tells you otherwise.  WHAT YOU CAN EXPECT TODAY Some feelings of bloating in the abdomen.  Passage of more gas than usual.  Spotting of blood in your stool or on the toilet paper.  IF YOU HAD POLYPS REMOVED DURING THE COLONOSCOPY: No aspirin products for 7 days or as instructed.  No alcohol for 7 days or as instructed.  Eat a soft diet for the next 24 hours.  FINDING OUT THE RESULTS OF YOUR TEST Not all test results are available during your visit. If your test results are not back during the visit, make an appointment with your caregiver to find out the  results. Do not assume everything is normal if you have not heard from your caregiver or the medical facility. It is important for you to follow up on all of your test results.  SEEK IMMEDIATE MEDICAL ATTENTION IF: You have more than a spotting of blood in your stool.  Your belly is swollen (abdominal distention).  You are nauseated or vomiting.  You have a temperature over 101.  You have abdominal pain or discomfort that is severe or gets worse throughout the day.     2 small polyps found and removed  Further recommendations to follow pending review of pathology report  At patient request, called Estela Ku at [phone] findings and recommendations

## 2024-06-05 NOTE — H&P (Addendum)
 @LOGO @   Primary Care Physician:  Bertell Satterfield, MD Primary Gastroenterologist:  Dr. Shaaron  Pre-Procedure History & Physical: HPI:  Timothy Mcdaniel is a 78 y.o. male here for surveillance colonoscopy.  Distant history colonic adenoma.  Father with colon cancer.  No bowel symptoms aside from mild chronic constipation.  He is here for 1 more surveillance examination.  Past Medical History:  Diagnosis Date   Arthritis    OA- both knees , L shoulder    Exogenous obesity    Hypercholesteremia    Hypertension    Nonischemic cardiomyopathy (HCC)    OA (osteoarthritis)    OSA on CPAP    Recurrent upper respiratory infection (URI)    DEC. had flu shot- followed by bad cold, treated /w OTC   Shortness of breath    Stroke (HCC)    ministroke    Past Surgical History:  Procedure Laterality Date   COLONOSCOPY N/A 02/09/2014   Procedure: COLONOSCOPY;  Surgeon: Lamar CHRISTELLA Shaaron, MD;  Location: [facility];  Service: Endoscopy;  Laterality: N/A;  11:30 AM   EYE SURGERY     cataracts   MANDIBLE FRACTURE SURGERY     1960's - occupational injury   TOTAL KNEE ARTHROPLASTY  02/15/2012   Procedure: TOTAL KNEE ARTHROPLASTY;  Surgeon: Garnette JONETTA Raman, MD;  Location: [facility];  Service: Orthopedics;  Laterality: Left;   TOTAL KNEE ARTHROPLASTY Right 11/23/2022   Procedure: TOTAL KNEE ARTHROPLASTY;  Surgeon: Melodi Lerner, MD;  Location: [facility];  Service: Orthopedics;  Laterality: Right;   TRANSTHORACIC ECHOCARDIOGRAM  08/03/2011   EF 50%; LV systolic funtion mildly reduced; trace MR, mild TR   TRANSURETHRAL RESECTION OF BLADDER TUMOR N/A 08/12/2021   Procedure: TRANSURETHRAL RESECTION OF BLADDER TUMOR (TURBT) WITH POST OPERATIVE INSTILLATION OF GEMCITABINE ;  Surgeon: Nieves Cough, MD;  Location: [facility];  Service: Urology;  Laterality: N/A;   TRANSURETHRAL RESECTION OF BLADDER TUMOR N/A 11/14/2021   Procedure: TRANSURETHRAL RESECTION OF SMALL BLADDER TUMOR (TURBT) BILATERAL RETROGRADE/POST  OPERATIVE INSTILLATION OF GEMCITABINE ;  Surgeon: Nieves Cough, MD;  Location: [facility];  Service: Urology;  Laterality: N/A;    Prior to Admission medications   Medication Sig Start Date End Date Taking? Authorizing Provider  aspirin EC 81 MG tablet Take 81 mg by mouth daily. Swallow whole.   Yes [provider]  CALCIUM MAGNESIUM ZINC PO Take 1 capsule by mouth daily.   Yes [provider]  Cholecalciferol (VITAMIN D-3) 125 MCG (5000 UT) TABS Take 125 mcg by mouth 3 (three) times a week.   Yes [provider]  Coenzyme Q10 (COQ10) 200 MG CAPS Take 200 mg by mouth daily.   Yes [provider]  dorzolamide -timolol  (COSOPT ) 22.3-6.8 MG/ML ophthalmic solution Place 1 drop into the right eye at bedtime.   Yes [provider]  dutasteride (AVODART) 0.5 MG capsule Take 0.5 mg by mouth daily. 01/31/24  Yes [provider]  fenofibrate  (TRICOR ) 48 MG tablet TAKE ONE TABLET BY MOUTH ONCE DAILY 10/31/14  Yes Hilty, Vinie BROCKS, MD  hydrochlorothiazide  (HYDRODIURIL ) 25 MG tablet Take 25 mg by mouth daily.   Yes [provider]  Anselm Oil 500 MG CAPS Take 500 mg by mouth daily.   Yes [provider]  lisinopril  (PRINIVIL ,ZESTRIL ) 40 MG tablet Take 40 mg by mouth daily.   Yes [provider]  Multiple Vitamin (MULTIVITAMIN) tablet Take 1 tablet by mouth daily.   Yes [provider]  traMADol  (ULTRAM ) 50 MG tablet  Take 50 mg by mouth at bedtime.   Yes [provider]  traZODone  (DESYREL ) 100 MG tablet Take 100 mg by mouth at bedtime.   Yes [provider]  Turmeric (QC TUMERIC COMPLEX PO) Take 1,500 mg by mouth daily.   Yes [provider]    Allergies as of 04/20/2024   (No Known Allergies)    Family History  Problem Relation Age of Onset   Heart attack Mother    Cancer Father    Kidney disease Brother    Anesthesia problems Neg Hx    Hypotension Neg Hx    Malignant hyperthermia Neg  Hx    Pseudochol deficiency Neg Hx    Colon cancer Neg Hx     Social History   Socioeconomic History   Marital status: Married    Spouse name: Tolbert Matheson   Number of children: 2   Years of education: 12   Highest education level: 12th grade  Occupational History   Not on file  Tobacco Use   Smoking status: Former    Current packs/day: 0.00    Average packs/day: 0.5 packs/day for 30.0 years (15.0 ttl pk-yrs)    Types: Cigarettes    Start date: 03/29/1964    Quit date: 03/29/1994    Years since quitting: 30.2    Passive exposure: Past   Smokeless tobacco: Never  Vaping Use   Vaping status: Never Used  Substance and Sexual Activity   Alcohol use: Yes    Comment: rare    one time a month   Drug use: No   Sexual activity: Not Currently    Partners: Female  Other Topics Concern   Not on file  Social History Narrative   Not on file   Social Drivers of Health   Financial Resource Strain: Low Risk  (11/27/2022)   Overall Financial Resource Strain (CARDIA)    Difficulty of Paying Living Expenses: Not hard at all  Food Insecurity: No Food Insecurity (11/27/2022)   Hunger Vital Sign    Worried About Running Out of Food in the Last Year: Never true    Ran Out of Food in the Last Year: Never true  Transportation Needs: No Transportation Needs (11/27/2022)   PRAPARE - Administrator, Civil Service (Medical): No    Lack of Transportation (Non-Medical): No  Physical Activity: Sufficiently Active (11/27/2022)   Exercise Vital Sign    Days of Exercise per Week: 5 days    Minutes of Exercise per Session: 30 min  Stress: No Stress Concern Present (11/27/2022)   [employer] of Occupational Health - Occupational Stress Questionnaire    Feeling of Stress : Not at all  Social Connections: Socially Integrated (11/27/2022)   Social Connection and Isolation Panel    Frequency of Communication with Friends and Family: More than three times a week    Frequency of  Social Gatherings with Friends and Family: More than three times a week    Attends Religious Services: More than 4 times per year    Active Member of [employer] or Organizations: Yes    Attends Banker Meetings: More than 4 times per year    Marital Status: Married  Catering manager Violence: Not At Risk (11/27/2022)   Humiliation, Afraid, Rape, and Kick questionnaire    Fear of Current or Ex-Partner: No    Emotionally Abused: No    Physically Abused: No    Sexually Abused: No    Review of Systems: See  HPI, otherwise negative ROS  Physical Exam: BP 118/67 (BP Location: Right Arm)   Temp 98.8 F (37.1 C)   Resp 14   Ht 5' 11 (1.803 m)   Wt 127.1 kg   SpO2 95%   BMI 39.08 kg/m  General:   Alert,  Well-developed, well-nourished, pleasant and cooperative in NAD Neck:  Supple; no masses or thyromegaly. No significant cervical adenopathy. Lungs:  Clear throughout to auscultation.   No wheezes, crackles, or rhonchi. No acute distress. Heart:  Regular rate and rhythm; no murmurs, clicks, rubs,  or gallops. Abdomen: Non-distended, normal bowel sounds.  Soft and nontender without appreciable mass or hepatosplenomegaly.   Impression/Plan: 78 year old gentleman here for surveillance colonoscopy.  Distant history of colonic adenoma.  Father with colon cancer. The risks, benefits, limitations, alternatives and imponderables have been reviewed with the patient. Questions have been answered. All parties are agreeable.       Notice: This dictation was prepared with Dragon dictation along with smaller phrase technology. Any transcriptional errors that result from this process are unintentional and may not be corrected upon review.

## 2024-06-06 ENCOUNTER — Encounter (HOSPITAL_COMMUNITY): Payer: Self-pay | Admitting: Internal Medicine

## 2024-06-06 ENCOUNTER — Ambulatory Visit: Payer: Self-pay | Admitting: Internal Medicine

## 2024-06-06 LAB — SURGICAL PATHOLOGY

## 2024-06-07 NOTE — Anesthesia Postprocedure Evaluation (Signed)
 Anesthesia Post Note  Patient: Timothy Mcdaniel  Procedure(s) Performed: COLONOSCOPY  Patient location during evaluation: Phase II Anesthesia Type: General Level of consciousness: awake Pain management: pain level controlled Vital Signs Assessment: post-procedure vital signs reviewed and stable Respiratory status: spontaneous breathing and respiratory function stable Cardiovascular status: blood pressure returned to baseline and stable Postop Assessment: no headache and no apparent nausea or vomiting Anesthetic complications: no Comments: Late entry   No notable events documented.   Last Vitals:  Vitals:   06/05/24 1120 06/05/24 1334  BP: 118/67 113/66  Pulse:  66  Resp: 14 16  Temp: 37.1 C 36.6 C  SpO2: 95% 100%    Last Pain:  Vitals:   06/05/24 1334  TempSrc: Axillary  PainSc:                  Yvonna JINNY Bosworth

## 2024-07-10 DIAGNOSIS — R7303 Prediabetes: Secondary | ICD-10-CM | POA: Diagnosis not present

## 2024-07-10 DIAGNOSIS — I10 Essential (primary) hypertension: Secondary | ICD-10-CM | POA: Diagnosis not present

## 2024-07-10 DIAGNOSIS — Z1331 Encounter for screening for depression: Secondary | ICD-10-CM | POA: Diagnosis not present

## 2024-07-10 DIAGNOSIS — Z0001 Encounter for general adult medical examination with abnormal findings: Secondary | ICD-10-CM | POA: Diagnosis not present

## 2024-07-10 DIAGNOSIS — Z6837 Body mass index (BMI) 37.0-37.9, adult: Secondary | ICD-10-CM | POA: Diagnosis not present

## 2024-07-10 DIAGNOSIS — G4730 Sleep apnea, unspecified: Secondary | ICD-10-CM | POA: Diagnosis not present

## 2024-07-10 DIAGNOSIS — E6609 Other obesity due to excess calories: Secondary | ICD-10-CM | POA: Diagnosis not present

## 2024-09-18 ENCOUNTER — Ambulatory Visit: Admitting: Urology

## 2024-09-18 VITALS — BP 132/85 | HR 82

## 2024-09-18 DIAGNOSIS — R350 Frequency of micturition: Secondary | ICD-10-CM

## 2024-09-18 DIAGNOSIS — N401 Enlarged prostate with lower urinary tract symptoms: Secondary | ICD-10-CM

## 2024-09-18 DIAGNOSIS — Z08 Encounter for follow-up examination after completed treatment for malignant neoplasm: Secondary | ICD-10-CM

## 2024-09-18 DIAGNOSIS — Z8551 Personal history of malignant neoplasm of bladder: Secondary | ICD-10-CM | POA: Diagnosis not present

## 2024-09-18 DIAGNOSIS — C672 Malignant neoplasm of lateral wall of bladder: Secondary | ICD-10-CM

## 2024-09-18 DIAGNOSIS — N138 Other obstructive and reflux uropathy: Secondary | ICD-10-CM

## 2024-09-18 MED ORDER — CIPROFLOXACIN HCL 500 MG PO TABS
500.0000 mg | ORAL_TABLET | Freq: Once | ORAL | Status: AC
  Administered 2024-09-18: 500 mg via ORAL

## 2024-09-18 NOTE — Progress Notes (Unsigned)
  Nenzel  09/18/24  CC: No chief complaint on file.   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  He presented with gross hematuria. F/u TUR 12/22 benign. Missed his BCG maintenance in 2023 and f/u 1 year later in 2024. Had a knee replacement.    Biopsy/TUR: August 2022-high-grade T1 right lateral and left up toward dome. 2 cm.  Gemcitabine  instilled. Dec 2022 - TUR (benign no residual disease), bilat rgp (normal), DRE nl    Staging/imaging: June 2022 CT scan abdomen and pelvis-benign.  No bone lesions or lymphadenopathy. Dec 2022 bilateral RGP - normal    BCG: Feb 2023 - Induction x 19 Jul 2022 - maintenance  (Missed next BCG due to knee replacement)      Today, seen for the above. He is well. No dysuria or gross hematuria. Sometimes he doesn't feel like he empties his bladder and needs to double void. He tried tamsulosin  but I do not think he stayed on it.  He may have started dutasterideas it is listed on his medications.  Has some frequency.  There were no vitals taken for this visit. NED. A&Ox3.   No respiratory distress   Abd soft, NT, ND Normal phallus with bilateral descended testicles  Cystoscopy Procedure Note  Patient identification was confirmed, informed consent was obtained, and patient was prepped using Betadine  solution.  Lidocaine  jelly was administered per urethral meatus.     Pre-Procedure: - Inspection reveals a normal caliber ureteral meatus.  Procedure: The flexible cystoscope was introduced without difficulty - No urethral strictures/lesions are present. - Lateral lobe obstructing prostate, small knuckle of median lobe - Normal bladder neck - Bilateral ureteral orifices identified - Bladder mucosa  reveals no ulcers, tumors, or lesions - No bladder stones - No trabeculation  Retroflexion shows normal bladder and bladder neck   Post-Procedure: - Patient tolerated the procedure  well  Assessment/ Plan:  History of bladder cancer-cystoscopy benign today.  Recheck in 6 months.  BPH-continue dutasteride.  Discussed adding an OAB med but he is not bothered.  No follow-ups on file.  Donnice Brooks, MD

## 2024-09-19 LAB — URINALYSIS, ROUTINE W REFLEX MICROSCOPIC
Bilirubin, UA: NEGATIVE
Glucose, UA: NEGATIVE
Ketones, UA: NEGATIVE
Leukocytes,UA: NEGATIVE
Nitrite, UA: NEGATIVE
Protein,UA: NEGATIVE
RBC, UA: NEGATIVE
Specific Gravity, UA: 1.015 (ref 1.005–1.030)
Urobilinogen, Ur: 1 mg/dL (ref 0.2–1.0)
pH, UA: 7.5 (ref 5.0–7.5)

## 2025-03-19 ENCOUNTER — Other Ambulatory Visit: Admitting: Urology
